# Patient Record
Sex: Male | Born: 1960 | Race: White | Hispanic: No | Marital: Married | Smoking: Never smoker
Health system: Southern US, Community
[De-identification: ages and names within clinical notes are randomized; demographics above are authoritative.]

## PROBLEM LIST (undated history)

## (undated) DIAGNOSIS — M51369 Other intervertebral disc degeneration, lumbar region without mention of lumbar back pain or lower extremity pain: Secondary | ICD-10-CM

## (undated) DIAGNOSIS — G8929 Other chronic pain: Secondary | ICD-10-CM

## (undated) DIAGNOSIS — C220 Liver cell carcinoma: Secondary | ICD-10-CM

## (undated) DIAGNOSIS — G43909 Migraine, unspecified, not intractable, without status migrainosus: Secondary | ICD-10-CM

## (undated) DIAGNOSIS — M5136 Other intervertebral disc degeneration, lumbar region: Secondary | ICD-10-CM

## (undated) DIAGNOSIS — K7290 Hepatic failure, unspecified without coma: Secondary | ICD-10-CM

## (undated) DIAGNOSIS — E119 Type 2 diabetes mellitus without complications: Secondary | ICD-10-CM

## (undated) DIAGNOSIS — D696 Thrombocytopenia, unspecified: Secondary | ICD-10-CM

## (undated) DIAGNOSIS — M5126 Other intervertebral disc displacement, lumbar region: Secondary | ICD-10-CM

## (undated) DIAGNOSIS — K219 Gastro-esophageal reflux disease without esophagitis: Secondary | ICD-10-CM

## (undated) DIAGNOSIS — B1920 Unspecified viral hepatitis C without hepatic coma: Secondary | ICD-10-CM

## (undated) DIAGNOSIS — I10 Essential (primary) hypertension: Secondary | ICD-10-CM

## (undated) DIAGNOSIS — K7682 Hepatic encephalopathy: Secondary | ICD-10-CM

## (undated) HISTORY — DX: Hepatic encephalopathy: K76.82

## (undated) HISTORY — DX: Other intervertebral disc degeneration, lumbar region without mention of lumbar back pain or lower extremity pain: M51.369

## (undated) HISTORY — DX: Hepatic failure, unspecified without coma: K72.90

## (undated) HISTORY — DX: Type 2 diabetes mellitus without complications: E11.9

## (undated) HISTORY — DX: Other intervertebral disc displacement, lumbar region: M51.26

## (undated) HISTORY — DX: Essential (primary) hypertension: I10

## (undated) HISTORY — DX: Unspecified viral hepatitis C without hepatic coma: B19.20

## (undated) HISTORY — DX: Migraine, unspecified, not intractable, without status migrainosus: G43.909

## (undated) HISTORY — DX: Gastro-esophageal reflux disease without esophagitis: K21.9

## (undated) HISTORY — PX: KNEE SURGERY: SHX244

## (undated) HISTORY — DX: Other intervertebral disc degeneration, lumbar region: M51.36

---

## 2008-08-09 ENCOUNTER — Emergency Department: Payer: Self-pay | Admitting: Emergency Medicine

## 2009-01-22 ENCOUNTER — Ambulatory Visit: Payer: Self-pay

## 2009-09-17 ENCOUNTER — Ambulatory Visit: Payer: Self-pay | Admitting: Family Medicine

## 2009-11-05 ENCOUNTER — Ambulatory Visit: Payer: Self-pay | Admitting: Nurse Practitioner

## 2009-11-12 ENCOUNTER — Ambulatory Visit: Payer: Self-pay | Admitting: Nurse Practitioner

## 2009-12-25 ENCOUNTER — Ambulatory Visit: Payer: Self-pay | Admitting: Pain Medicine

## 2009-12-31 ENCOUNTER — Ambulatory Visit: Payer: Self-pay | Admitting: Pain Medicine

## 2011-01-20 ENCOUNTER — Ambulatory Visit: Payer: Self-pay | Admitting: "Endocrinology

## 2011-02-19 LAB — HM DIABETES EYE EXAM

## 2012-08-11 LAB — HM DIABETES EYE EXAM

## 2014-01-11 LAB — HM DIABETES EYE EXAM

## 2014-08-02 ENCOUNTER — Ambulatory Visit: Payer: Self-pay

## 2014-08-02 DIAGNOSIS — I517 Cardiomegaly: Secondary | ICD-10-CM

## 2014-08-27 ENCOUNTER — Encounter: Payer: Self-pay | Admitting: Cardiovascular Disease

## 2014-10-23 ENCOUNTER — Ambulatory Visit: Payer: Self-pay

## 2014-10-30 ENCOUNTER — Ambulatory Visit: Payer: Self-pay

## 2015-01-01 ENCOUNTER — Ambulatory Visit: Payer: Self-pay

## 2015-01-01 DIAGNOSIS — I10 Essential (primary) hypertension: Secondary | ICD-10-CM | POA: Insufficient documentation

## 2015-01-01 DIAGNOSIS — K760 Fatty (change of) liver, not elsewhere classified: Secondary | ICD-10-CM | POA: Insufficient documentation

## 2015-01-01 DIAGNOSIS — K219 Gastro-esophageal reflux disease without esophagitis: Secondary | ICD-10-CM | POA: Insufficient documentation

## 2015-01-01 DIAGNOSIS — E119 Type 2 diabetes mellitus without complications: Secondary | ICD-10-CM | POA: Insufficient documentation

## 2015-01-09 ENCOUNTER — Ambulatory Visit: Payer: Self-pay | Admitting: Internal Medicine

## 2015-01-10 ENCOUNTER — Ambulatory Visit: Payer: Self-pay | Admitting: Ophthalmology

## 2015-01-10 ENCOUNTER — Ambulatory Visit: Payer: Self-pay

## 2015-01-15 ENCOUNTER — Ambulatory Visit: Payer: Self-pay

## 2015-04-11 ENCOUNTER — Ambulatory Visit: Payer: Self-pay

## 2015-04-18 ENCOUNTER — Ambulatory Visit: Payer: Self-pay

## 2015-04-18 DIAGNOSIS — B1920 Unspecified viral hepatitis C without hepatic coma: Secondary | ICD-10-CM | POA: Insufficient documentation

## 2015-04-25 ENCOUNTER — Ambulatory Visit: Payer: Self-pay

## 2015-08-01 ENCOUNTER — Other Ambulatory Visit: Payer: Self-pay

## 2015-08-01 LAB — CBC AND DIFFERENTIAL
HEMATOCRIT: 42 % (ref 41–53)
HEMOGLOBIN: 14.7 g/dL (ref 13.5–17.5)
NEUTROS ABS: 3 /uL
PLATELETS: 82 10*3/uL — AB (ref 150–399)
WBC: 5.9 10^3/mL

## 2015-08-01 LAB — HEPATIC FUNCTION PANEL
ALK PHOS: 63 U/L (ref 25–125)
ALT: 23 U/L (ref 10–40)
AST: 34 U/L (ref 14–40)
BILIRUBIN, TOTAL: 0.7 mg/dL

## 2015-08-01 LAB — BASIC METABOLIC PANEL
BUN: 20 mg/dL (ref 4–21)
Creatinine: 0.9 mg/dL (ref 0.6–1.3)
GLUCOSE: 85 mg/dL
Potassium: 4.7 mmol/L (ref 3.4–5.3)
SODIUM: 140 mmol/L (ref 137–147)

## 2015-08-01 LAB — HEMOGLOBIN A1C: Hemoglobin A1C: 6.9

## 2015-08-13 ENCOUNTER — Ambulatory Visit: Payer: Self-pay

## 2015-08-15 ENCOUNTER — Ambulatory Visit: Payer: Self-pay

## 2015-08-20 ENCOUNTER — Telehealth: Payer: Self-pay

## 2015-08-20 NOTE — Telephone Encounter (Signed)
Pt called to reschd appt for 08/15/15, called and reschd appt for 09/04/15

## 2015-09-04 ENCOUNTER — Ambulatory Visit: Payer: Self-pay | Admitting: Internal Medicine

## 2015-09-06 DIAGNOSIS — B1920 Unspecified viral hepatitis C without hepatic coma: Secondary | ICD-10-CM

## 2015-09-06 DIAGNOSIS — K760 Fatty (change of) liver, not elsewhere classified: Secondary | ICD-10-CM

## 2015-09-06 DIAGNOSIS — Z794 Long term (current) use of insulin: Secondary | ICD-10-CM

## 2015-09-06 DIAGNOSIS — I10 Essential (primary) hypertension: Secondary | ICD-10-CM

## 2015-09-06 DIAGNOSIS — E119 Type 2 diabetes mellitus without complications: Secondary | ICD-10-CM

## 2015-09-06 DIAGNOSIS — K219 Gastro-esophageal reflux disease without esophagitis: Secondary | ICD-10-CM

## 2015-09-11 ENCOUNTER — Ambulatory Visit: Payer: Self-pay | Admitting: Internal Medicine

## 2015-09-11 ENCOUNTER — Encounter: Payer: Self-pay | Admitting: Internal Medicine

## 2015-09-11 VITALS — BP 124/71 | HR 88 | Temp 98.2°F | Wt 256.0 lb

## 2015-09-11 DIAGNOSIS — K7460 Unspecified cirrhosis of liver: Secondary | ICD-10-CM

## 2015-09-11 DIAGNOSIS — I10 Essential (primary) hypertension: Secondary | ICD-10-CM

## 2015-09-11 DIAGNOSIS — B1920 Unspecified viral hepatitis C without hepatic coma: Secondary | ICD-10-CM

## 2015-09-11 DIAGNOSIS — D696 Thrombocytopenia, unspecified: Secondary | ICD-10-CM

## 2015-09-11 DIAGNOSIS — E139 Other specified diabetes mellitus without complications: Secondary | ICD-10-CM

## 2015-09-11 LAB — GLUCOSE, POCT (MANUAL RESULT ENTRY): POC GLUCOSE: 280 mg/dL — AB (ref 70–99)

## 2015-09-11 NOTE — Assessment & Plan Note (Signed)
Has not gotten worse since 2012.

## 2015-09-11 NOTE — Assessment & Plan Note (Addendum)
Advised to lose a little weight. Blood sugars high but last A1c was at target.

## 2015-09-11 NOTE — Assessment & Plan Note (Signed)
Has been resolved through [facility]

## 2015-09-11 NOTE — Progress Notes (Signed)
Subjective:    Patient ID: FITZ ROBBERSON, male    DOB: Jan 16, 1961, 55 y.o.   MRN: [id number]  HPI  Patient Active Problem List   Diagnosis Date Noted  . Thrombocytopenia ([place]) 09/11/2015  . Cirrhosis ([place]) 09/11/2015  . Hepatitis C 04/18/2015  . Diabetes ([place]) 01/01/2015  . GERD (gastroesophageal reflux disease) 01/01/2015  . Hypertension 01/01/2015  . Fatty liver 01/01/2015   Pt presents with a f/u for diabetes. Blood glucose this morning was 280 - pt claims it is because he just ate [place]. Pt checks sugars 3 times daily. Sugars stay pretty high. Last A1c was 6.9, which is much improved.   Pt presents with a f/u for hypertension. Pt had a BP of 124/71 this morning.  Pt has completed treatment regime for Hep C from [facility].   Pt feels well. Pt is still seeing the mental health specialist.   Review of Systems     Objective:   Physical Exam  Constitutional: He is oriented to person, place, and time.  Cardiovascular: Normal rate, regular rhythm and normal heart sounds.   Pulmonary/Chest: Effort normal and breath sounds normal.  Neurological: He is alert and oriented to person, place, and time.    BP 124/71 mmHg  Pulse 88  Temp(Src) 98.2 F (36.8 C)  Wt 256 lb (116.121 kg)   Blood glucose: 280    Medication List       This list is accurate as of: 09/11/15 11:31 AM.  Always use your most recent med list.               ALPRAZolam 1 MG tablet  Commonly known as:  XANAX  Take 1 mg by mouth 3 (three) times daily.     amitriptyline 50 MG tablet  Commonly known as:  ELAVIL  Take 50 mg by mouth at bedtime.     Daclatasvir Dihydrochloride 60 MG Tabs  Take 60 mg by mouth daily.     hydrochlorothiazide 25 MG tablet  Commonly known as:  HYDRODIURIL  Take 25 mg by mouth daily.     Insulin Glargine 100 UNIT/ML Solostar Pen  Commonly known as:  LANTUS  Inject 50 Units into the skin at bedtime.     methadone 10 MG tablet  Commonly known as:   DOLOPHINE  Take 10 mg by mouth daily.     mirtazapine 45 MG tablet  Commonly known as:  REMERON  Take 45 mg by mouth at bedtime.     NOVOLOG FLEXPEN [place]  Inject 35 Units into the skin 3 (three) times daily.     ranitidine 150 MG tablet  Commonly known as:  ZANTAC  Take 150 mg by mouth daily.     spironolactone 50 MG tablet  Commonly known as:  ALDACTONE  Take 50 mg by mouth daily.       Reviewed [facility] notes of 06/04/15. Pt has successfully completed Sovaldi 1 tablet daily and Dalkinza 1 tablet daily for Hep C. Chronic HCV, native pt, genotype 3, with well-compensated cirrhosis.      Assessment & Plan:  Reviewed [facility] notes of 06/04/15. Pt has successfully completed Sovaldi 1 tablet daily and Dalkinza 1 tablet daily for Hep C. Chronic HCV, native pt, genotype 3, with well-compensated cirrhosis.   Cirrhosis ([place]) Diagnosed at [facility]  Thrombocytopenia [facility]) Has not gotten worse since 2012.   Hepatitis C Has been resolved through [facility]  Diabetes [facility]) Advised to  lose a little weight. Blood sugars high but last A1c was at target.   Return in 3 months A1c, Metc, CBC, lipid, tsh,

## 2015-09-11 NOTE — Assessment & Plan Note (Signed)
Diagnosed at [facility]

## 2015-11-20 ENCOUNTER — Other Ambulatory Visit: Payer: Self-pay

## 2015-11-20 DIAGNOSIS — I10 Essential (primary) hypertension: Secondary | ICD-10-CM

## 2015-11-20 DIAGNOSIS — D696 Thrombocytopenia, unspecified: Secondary | ICD-10-CM

## 2015-11-20 DIAGNOSIS — E139 Other specified diabetes mellitus without complications: Secondary | ICD-10-CM

## 2015-11-21 LAB — HEMOGLOBIN A1C
ESTIMATED AVERAGE GLUCOSE: 151 mg/dL
HEMOGLOBIN A1C: 6.9 % — AB (ref 4.8–5.6)

## 2015-11-21 LAB — CBC WITH DIFFERENTIAL/PLATELET
BASOS ABS: 0 10*3/uL (ref 0.0–0.2)
Basos: 0 %
EOS (ABSOLUTE): 0.2 10*3/uL (ref 0.0–0.4)
Eos: 4 %
Hematocrit: 40.4 % (ref 37.5–51.0)
Hemoglobin: 14 g/dL (ref 12.6–17.7)
IMMATURE GRANS (ABS): 0 10*3/uL (ref 0.0–0.1)
Immature Granulocytes: 0 %
LYMPHS: 24 %
Lymphocytes Absolute: 1.2 10*3/uL (ref 0.7–3.1)
MCH: 31 pg (ref 26.6–33.0)
MCHC: 34.7 g/dL (ref 31.5–35.7)
MCV: 89 fL (ref 79–97)
Monocytes Absolute: 0.3 10*3/uL (ref 0.1–0.9)
Monocytes: 6 %
NEUTROS ABS: 3.4 10*3/uL (ref 1.4–7.0)
NEUTROS PCT: 66 %
PLATELETS: 77 10*3/uL — AB (ref 150–379)
RBC: 4.52 x10E6/uL (ref 4.14–5.80)
RDW: 14.3 % (ref 12.3–15.4)
WBC: 5.2 10*3/uL (ref 3.4–10.8)

## 2015-11-21 LAB — COMPREHENSIVE METABOLIC PANEL
A/G RATIO: 1.2 (ref 1.2–2.2)
ALK PHOS: 63 IU/L (ref 39–117)
ALT: 25 IU/L (ref 0–44)
AST: 30 IU/L (ref 0–40)
Albumin: 3.8 g/dL (ref 3.5–5.5)
BILIRUBIN TOTAL: 0.7 mg/dL (ref 0.0–1.2)
BUN/Creatinine Ratio: 23 — ABNORMAL HIGH (ref 9–20)
BUN: 20 mg/dL (ref 6–24)
CHLORIDE: 101 mmol/L (ref 96–106)
CO2: 21 mmol/L (ref 18–29)
Calcium: 9.1 mg/dL (ref 8.7–10.2)
Creatinine, Ser: 0.87 mg/dL (ref 0.76–1.27)
GFR calc non Af Amer: 98 mL/min/{1.73_m2} (ref >59)
GFR, EST AFRICAN AMERICAN: 113 mL/min/{1.73_m2} (ref >59)
Globulin, Total: 3.2 g/dL (ref 1.5–4.5)
Glucose: 164 mg/dL — ABNORMAL HIGH (ref 65–99)
POTASSIUM: 5 mmol/L (ref 3.5–5.2)
Sodium: 136 mmol/L (ref 134–144)
Total Protein: 7 g/dL (ref 6.0–8.5)

## 2015-11-21 LAB — TSH: TSH: 2.22 u[IU]/mL (ref 0.450–4.500)

## 2015-11-21 LAB — LIPID PANEL
Chol/HDL Ratio: 2.7 ratio units (ref 0.0–5.0)
Cholesterol, Total: 117 mg/dL (ref 100–199)
HDL: 43 mg/dL (ref >39)
LDL Calculated: 57 mg/dL (ref 0–99)
Triglycerides: 86 mg/dL (ref 0–149)
VLDL Cholesterol Cal: 17 mg/dL (ref 5–40)

## 2015-12-04 ENCOUNTER — Encounter: Payer: Self-pay | Admitting: Internal Medicine

## 2015-12-04 ENCOUNTER — Ambulatory Visit: Payer: Self-pay | Admitting: Internal Medicine

## 2015-12-04 VITALS — BP 146/91 | HR 106 | Temp 98.7°F | Wt 238.0 lb

## 2015-12-04 DIAGNOSIS — Z794 Long term (current) use of insulin: Secondary | ICD-10-CM

## 2015-12-04 DIAGNOSIS — E119 Type 2 diabetes mellitus without complications: Secondary | ICD-10-CM

## 2015-12-04 LAB — GLUCOSE, POCT (MANUAL RESULT ENTRY): POC Glucose: 167 mg/dL — AB (ref 70–99)

## 2015-12-04 NOTE — Patient Instructions (Signed)
Labs before 3 month appt.

## 2015-12-04 NOTE — Progress Notes (Signed)
   Subjective:    Patient ID: Michael Mack, male    DOB: 02/26/1961, 55 y.o.   MRN: [id number]  HPI   Patient presents with follow up for diabetes. Patient presents with elevated blood pressure and pulse. Patient prescribed methodone from [facility] for pain due to knee surgery. Patient goes to mental health for other psychotropic medications. Blood glucose goes as low as 70.     Patient Active Problem List   Diagnosis Date Noted  . Thrombocytopenia ([place]) 09/11/2015  . Cirrhosis ([place]) 09/11/2015  . Hepatitis C 04/18/2015  . Diabetes ([place]) 01/01/2015  . GERD (gastroesophageal reflux disease) 01/01/2015  . Hypertension 01/01/2015  . Fatty liver 01/01/2015       Review of Systems Patients platelets are low due to cirrhosis.    Objective:   Physical Exam  Constitutional: He is oriented to person, place, and time.  Cardiovascular: Normal rate and regular rhythm.   Pulmonary/Chest: Effort normal.  Neurological: He is alert and oriented to person, place, and time.    BP 146/91 mmHg  Pulse 106  Temp(Src) 98.7 F (37.1 C)  Wt 238 lb (107.956 kg)   Current Outpatient Prescriptions on File Prior to Visit  Medication Sig Dispense Refill  . ALPRAZolam (XANAX) 1 MG tablet Take 1 mg by mouth 3 (three) times daily.    Marland Kitchen amitriptyline (ELAVIL) 50 MG tablet Take 50 mg by mouth at bedtime.    . hydrochlorothiazide (HYDRODIURIL) 25 MG tablet Take 25 mg by mouth daily.    . Insulin Aspart (NOVOLOG FLEXPEN [place]) Inject 35 Units into the skin 3 (three) times daily.    . Insulin Glargine (LANTUS) 100 UNIT/ML Solostar Pen Inject 50 Units into the skin at bedtime.    . methadone (DOLOPHINE) 10 MG tablet Take 10 mg by mouth daily.    . mirtazapine (REMERON) 45 MG tablet Take 45 mg by mouth at bedtime.    . ranitidine (ZANTAC) 150 MG tablet Take 150 mg by mouth daily.    [place] Dihydrochloride 60 MG TABS Take 60 mg by mouth daily.    Marland Kitchen spironolactone (ALDACTONE) 50 MG tablet  Take 50 mg by mouth daily.     No current facility-administered medications on file prior to visit.         Assessment & Plan:    Patient needs follow up in 3 months with labs: Met C, CBC, GGT, A1c, UA, and microalbumin Patient needs follow up appt in 3 months after labs

## 2015-12-11 ENCOUNTER — Other Ambulatory Visit: Payer: Self-pay | Admitting: Nurse Practitioner

## 2016-02-27 ENCOUNTER — Telehealth: Payer: Self-pay | Admitting: Nurse Practitioner

## 2016-02-27 NOTE — Telephone Encounter (Signed)
Patient called to change appointment on September 20 at 12:15.

## 2016-03-11 ENCOUNTER — Other Ambulatory Visit: Payer: Self-pay

## 2016-03-11 DIAGNOSIS — E119 Type 2 diabetes mellitus without complications: Secondary | ICD-10-CM

## 2016-03-11 DIAGNOSIS — Z794 Long term (current) use of insulin: Secondary | ICD-10-CM

## 2016-03-12 LAB — COMPREHENSIVE METABOLIC PANEL
ALK PHOS: 62 IU/L (ref 39–117)
ALT: 23 IU/L (ref 0–44)
AST: 20 IU/L (ref 0–40)
Albumin/Globulin Ratio: 1.2 (ref 1.2–2.2)
Albumin: 4.2 g/dL (ref 3.5–5.5)
BUN/Creatinine Ratio: 26 — ABNORMAL HIGH (ref 9–20)
BUN: 22 mg/dL (ref 6–24)
Bilirubin Total: 0.6 mg/dL (ref 0.0–1.2)
CO2: 22 mmol/L (ref 18–29)
CREATININE: 0.85 mg/dL (ref 0.76–1.27)
Calcium: 9 mg/dL (ref 8.7–10.2)
Chloride: 100 mmol/L (ref 96–106)
GFR calc Af Amer: 114 mL/min/{1.73_m2} (ref >59)
GFR calc non Af Amer: 99 mL/min/{1.73_m2} (ref >59)
GLOBULIN, TOTAL: 3.4 g/dL (ref 1.5–4.5)
GLUCOSE: 180 mg/dL — AB (ref 65–99)
Potassium: 4.3 mmol/L (ref 3.5–5.2)
SODIUM: 138 mmol/L (ref 134–144)
Total Protein: 7.6 g/dL (ref 6.0–8.5)

## 2016-03-12 LAB — URINALYSIS
Bilirubin, UA: NEGATIVE
GLUCOSE, UA: NEGATIVE
KETONES UA: NEGATIVE
NITRITE UA: NEGATIVE
Protein, UA: NEGATIVE
SPEC GRAV UA: 1.015 (ref 1.005–1.030)
UUROB: 0.2 mg/dL (ref 0.2–1.0)
pH, UA: 6 (ref 5.0–7.5)

## 2016-03-12 LAB — HEMOGLOBIN A1C
ESTIMATED AVERAGE GLUCOSE: 146 mg/dL
Hgb A1c MFr Bld: 6.7 % — ABNORMAL HIGH (ref 4.8–5.6)

## 2016-03-12 LAB — CBC WITH DIFFERENTIAL/PLATELET
BASOS ABS: 0 10*3/uL (ref 0.0–0.2)
Basos: 0 %
EOS (ABSOLUTE): 0.1 10*3/uL (ref 0.0–0.4)
EOS: 2 %
HEMOGLOBIN: 14.7 g/dL (ref 12.6–17.7)
Hematocrit: 41.1 % (ref 37.5–51.0)
IMMATURE GRANULOCYTES: 0 %
Immature Grans (Abs): 0 10*3/uL (ref 0.0–0.1)
Lymphocytes Absolute: 1 10*3/uL (ref 0.7–3.1)
Lymphs: 17 %
MCH: 32 pg (ref 26.6–33.0)
MCHC: 35.8 g/dL — AB (ref 31.5–35.7)
MCV: 89 fL (ref 79–97)
MONOS ABS: 0.3 10*3/uL (ref 0.1–0.9)
Monocytes: 6 %
NEUTROS PCT: 75 %
Neutrophils Absolute: 4.2 10*3/uL (ref 1.4–7.0)
PLATELETS: 77 10*3/uL — AB (ref 150–379)
RBC: 4.6 x10E6/uL (ref 4.14–5.80)
RDW: 14.6 % (ref 12.3–15.4)
WBC: 5.7 10*3/uL (ref 3.4–10.8)

## 2016-03-12 LAB — MICROALBUMIN / CREATININE URINE RATIO
Creatinine, Urine: 73.4 mg/dL
MICROALB/CREAT RATIO: 71.5 mg/g{creat} — AB (ref 0.0–30.0)
MICROALBUM., U, RANDOM: 52.5 ug/mL

## 2016-03-12 LAB — GAMMA GT: GGT: 63 IU/L (ref 0–65)

## 2016-03-18 ENCOUNTER — Encounter: Payer: Self-pay | Admitting: Internal Medicine

## 2016-03-18 ENCOUNTER — Ambulatory Visit: Payer: Self-pay | Admitting: Internal Medicine

## 2016-03-18 VITALS — BP 132/70 | HR 90 | Wt 264.0 lb

## 2016-03-18 DIAGNOSIS — Z794 Long term (current) use of insulin: Secondary | ICD-10-CM

## 2016-03-18 DIAGNOSIS — E119 Type 2 diabetes mellitus without complications: Secondary | ICD-10-CM

## 2016-03-18 LAB — GLUCOSE, POCT (MANUAL RESULT ENTRY): POC Glucose: 116 mg/dl — AB (ref 70–99)

## 2016-03-18 NOTE — Progress Notes (Signed)
   Subjective:    Patient ID: Michael Mack, male    DOB: 05-20-1961, 55 y.o.   MRN: [id number]  HPI   Pt f/u for diabetes, thrombocytopenia, and cirrhosis BP is stable at 132/70 Pt is having MRI of liver   Patient Active Problem List   Diagnosis Date Noted  . Thrombocytopenia ([place]) 09/11/2015  . Cirrhosis ([place]) 09/11/2015  . Hepatitis C 04/18/2015  . Diabetes ([place]) 01/01/2015  . GERD (gastroesophageal reflux disease) 01/01/2015  . Hypertension 01/01/2015  . Fatty liver 01/01/2015     Medication List       Accurate as of 03/18/16  9:38 AM. Always use your most recent med list.          ALPRAZolam 1 MG tablet Commonly known as:  XANAX Take 1 mg by mouth 3 (three) times daily.   amitriptyline 50 MG tablet Commonly known as:  ELAVIL Take 50 mg by mouth at bedtime.   Daclatasvir Dihydrochloride 60 MG Tabs Take 60 mg by mouth daily.   hydrochlorothiazide 25 MG tablet Commonly known as:  HYDRODIURIL Take 25 mg by mouth daily.   Insulin Glargine 100 UNIT/ML Solostar Pen Commonly known as:  LANTUS Inject 50 Units into the skin at bedtime.   lisinopril 40 MG tablet Commonly known as:  PRINIVIL,ZESTRIL TAKE ONE TABLET BY MOUTH EVERY DAY.   methadone 10 MG tablet Commonly known as:  DOLOPHINE Take 10 mg by mouth daily.   mirtazapine 45 MG tablet Commonly known as:  REMERON Take 45 mg by mouth at bedtime.   NOVOLOG FLEXPEN [place] Inject 35 Units into the skin 3 (three) times daily.   ranitidine 150 MG tablet Commonly known as:  ZANTAC Take 150 mg by mouth daily.   spironolactone 50 MG tablet Commonly known as:  ALDACTONE Take 50 mg by mouth daily.        Review of Systems     Objective:   Physical Exam  Constitutional: He is oriented to person, place, and time.  Cardiovascular: Normal rate, regular rhythm and normal heart sounds.   Pulmonary/Chest: Effort normal and breath sounds normal.  Neurological: He is alert and oriented to person, place, and  time.    BP 132/70   Pulse 90   Wt 264 lb (119.7 kg)        Assessment & Plan:   F/u in 3 months w/ labs: CBC, Lipid, Met C, PSA

## 2016-03-18 NOTE — Patient Instructions (Signed)
F/u in 3 months w/ labs: CBC, Lipid, Met C, PSA

## 2016-04-01 ENCOUNTER — Ambulatory Visit: Payer: Self-pay | Admitting: Internal Medicine

## 2016-04-01 ENCOUNTER — Encounter: Payer: Self-pay | Admitting: Internal Medicine

## 2016-04-01 VITALS — BP 147/81 | HR 86 | Temp 98.2°F | Wt 258.0 lb

## 2016-04-01 DIAGNOSIS — E119 Type 2 diabetes mellitus without complications: Secondary | ICD-10-CM

## 2016-04-01 DIAGNOSIS — Z794 Long term (current) use of insulin: Secondary | ICD-10-CM

## 2016-04-01 LAB — GLUCOSE, POCT (MANUAL RESULT ENTRY): POC GLUCOSE: 132 mg/dL — AB (ref 70–99)

## 2016-04-01 MED ORDER — HYDROCHLOROTHIAZIDE 25 MG PO TABS: 25.0000 mg | ORAL_TABLET | Freq: Every day | ORAL | 2 refills | Status: DC

## 2016-04-01 MED ORDER — RANITIDINE HCL 150 MG PO TABS: 150.0000 mg | ORAL_TABLET | Freq: Every day | ORAL | 2 refills | Status: DC

## 2016-04-01 MED ORDER — LISINOPRIL 40 MG PO TABS: 40.0000 mg | ORAL_TABLET | Freq: Every day | ORAL | 2 refills | Status: DC

## 2016-04-01 NOTE — Patient Instructions (Signed)
F/u in 2 months w/ labs: Met C, CBC, Ua, A1C, Ammonia levelas

## 2016-04-01 NOTE — Progress Notes (Signed)
   Subjective:    Patient ID: Michael Mack, male    DOB: [birth year], 55 y.o.   MRN: [id number]  HPI   Pt presents w/ fatigue  Patient Active Problem List   Diagnosis Date Noted  . Thrombocytopenia ([place]) 09/11/2015  . Cirrhosis ([place]) 09/11/2015  . Hepatitis C 04/18/2015  . Diabetes ([place]) 01/01/2015  . GERD (gastroesophageal reflux disease) 01/01/2015  . Hypertension 01/01/2015  . Fatty liver 01/01/2015     Medication List       Accurate as of 04/01/16 10:01 AM. Always use your most recent med list.          ALPRAZolam 1 MG tablet Commonly known as:  XANAX Take 1 mg by mouth 3 (three) times daily.   amitriptyline 50 MG tablet Commonly known as:  ELAVIL Take 50 mg by mouth at bedtime.   Daclatasvir Dihydrochloride 60 MG Tabs Take 60 mg by mouth daily.   hydrochlorothiazide 25 MG tablet Commonly known as:  HYDRODIURIL Take 25 mg by mouth daily.   Insulin Glargine 100 UNIT/ML Solostar Pen Commonly known as:  LANTUS Inject 50 Units into the skin at bedtime.   lisinopril 40 MG tablet Commonly known as:  PRINIVIL,ZESTRIL TAKE ONE TABLET BY MOUTH EVERY DAY.   methadone 10 MG tablet Commonly known as:  DOLOPHINE Take 10 mg by mouth daily.   mirtazapine 45 MG tablet Commonly known as:  REMERON Take 45 mg by mouth at bedtime.   NOVOLOG FLEXPEN [place] Inject 35 Units into the skin 3 (three) times daily.   ranitidine 150 MG tablet Commonly known as:  ZANTAC Take 150 mg by mouth daily.   spironolactone 50 MG tablet Commonly known as:  ALDACTONE Take 50 mg by mouth daily.        Review of Systems     Objective:   Physical Exam  Constitutional: He is oriented to person, place, and time.  Cardiovascular: Normal rate, regular rhythm and normal heart sounds.   Pulmonary/Chest: Effort normal and breath sounds normal.  Neurological: He is alert and oriented to person, place, and time.     BP (!) 147/81   Pulse 86   Temp 98.2 F (36.8 C) (Oral)   Wt 258  lb (117 kg)       Assessment & Plan:   F/u in 2 months w/ labs: Met C, CBC, Ua, A1C, Ammonia levels

## 2016-05-27 ENCOUNTER — Other Ambulatory Visit: Payer: Self-pay

## 2016-05-27 ENCOUNTER — Ambulatory Visit: Payer: Self-pay | Admitting: Internal Medicine

## 2016-05-27 DIAGNOSIS — Z794 Long term (current) use of insulin: Secondary | ICD-10-CM

## 2016-05-27 DIAGNOSIS — E119 Type 2 diabetes mellitus without complications: Secondary | ICD-10-CM

## 2016-05-28 LAB — URINALYSIS
BILIRUBIN UA: NEGATIVE
Glucose, UA: NEGATIVE
Ketones, UA: NEGATIVE
NITRITE UA: NEGATIVE
PH UA: 6 (ref 5.0–7.5)
Protein, UA: NEGATIVE
RBC UA: NEGATIVE
SPEC GRAV UA: 1.009 (ref 1.005–1.030)
UUROB: 0.2 mg/dL (ref 0.2–1.0)

## 2016-05-28 LAB — CBC
Hematocrit: 40.3 % (ref 37.5–51.0)
Hemoglobin: 13.9 g/dL (ref 13.0–17.7)
MCH: 31 pg (ref 26.6–33.0)
MCHC: 34.5 g/dL (ref 31.5–35.7)
MCV: 90 fL (ref 79–97)
PLATELETS: 79 10*3/uL — AB (ref 150–379)
RBC: 4.48 x10E6/uL (ref 4.14–5.80)
RDW: 14.4 % (ref 12.3–15.4)
WBC: 5.6 10*3/uL (ref 3.4–10.8)

## 2016-05-28 LAB — COMPREHENSIVE METABOLIC PANEL
A/G RATIO: 1.1 — AB (ref 1.2–2.2)
ALBUMIN: 3.9 g/dL (ref 3.5–5.5)
ALT: 21 IU/L (ref 0–44)
AST: 23 IU/L (ref 0–40)
Alkaline Phosphatase: 58 IU/L (ref 39–117)
BILIRUBIN TOTAL: 0.6 mg/dL (ref 0.0–1.2)
BUN / CREAT RATIO: 19 (ref 9–20)
BUN: 17 mg/dL (ref 6–24)
CHLORIDE: 101 mmol/L (ref 96–106)
CO2: 28 mmol/L (ref 18–29)
Calcium: 9.2 mg/dL (ref 8.7–10.2)
Creatinine, Ser: 0.88 mg/dL (ref 0.76–1.27)
GFR calc non Af Amer: 97 mL/min/{1.73_m2} (ref >59)
GFR, EST AFRICAN AMERICAN: 112 mL/min/{1.73_m2} (ref >59)
Globulin, Total: 3.4 g/dL (ref 1.5–4.5)
Glucose: 109 mg/dL — ABNORMAL HIGH (ref 65–99)
POTASSIUM: 4.5 mmol/L (ref 3.5–5.2)
SODIUM: 141 mmol/L (ref 134–144)
TOTAL PROTEIN: 7.3 g/dL (ref 6.0–8.5)

## 2016-05-28 LAB — LIPID PANEL
Chol/HDL Ratio: 2.6 ratio units (ref 0.0–5.0)
Cholesterol, Total: 153 mg/dL (ref 100–199)
HDL: 60 mg/dL (ref >39)
LDL Calculated: 78 mg/dL (ref 0–99)
Triglycerides: 75 mg/dL (ref 0–149)
VLDL Cholesterol Cal: 15 mg/dL (ref 5–40)

## 2016-05-28 LAB — HEMOGLOBIN A1C
Est. average glucose Bld gHb Est-mCnc: 171 mg/dL
HEMOGLOBIN A1C: 7.6 % — AB (ref 4.8–5.6)

## 2016-05-28 LAB — PSA

## 2016-05-28 LAB — AMMONIA

## 2016-06-17 ENCOUNTER — Other Ambulatory Visit: Payer: Self-pay

## 2016-06-17 ENCOUNTER — Ambulatory Visit: Payer: Self-pay | Admitting: Internal Medicine

## 2016-06-17 ENCOUNTER — Encounter: Payer: Self-pay | Admitting: Internal Medicine

## 2016-06-17 VITALS — BP 132/72 | HR 97 | Temp 98.7°F | Wt 268.0 lb

## 2016-06-17 DIAGNOSIS — Z794 Long term (current) use of insulin: Secondary | ICD-10-CM

## 2016-06-17 DIAGNOSIS — E119 Type 2 diabetes mellitus without complications: Secondary | ICD-10-CM

## 2016-06-17 DIAGNOSIS — R0602 Shortness of breath: Secondary | ICD-10-CM

## 2016-06-17 LAB — GLUCOSE, POCT (MANUAL RESULT ENTRY): POC Glucose: 82 mg/dl (ref 70–99)

## 2016-06-17 MED ORDER — INSULIN ASPART 100 UNIT/ML ~~LOC~~ SOLN: 35.0000 [IU] | Freq: Three times a day (TID) | SUBCUTANEOUS | 11 refills | Status: DC

## 2016-06-17 MED ORDER — INSULIN GLARGINE 100 UNIT/ML SOLOSTAR PEN: 50.0000 [IU] | PEN_INJECTOR | Freq: Every day | SUBCUTANEOUS | 11 refills | Status: DC

## 2016-06-17 NOTE — Patient Instructions (Signed)
RTC in 3 months labs the week before cbc, met c, and a1c. Xray for SOB 

## 2016-06-17 NOTE — Progress Notes (Signed)
   Subjective:    Patient ID: Michael Mack, male    DOB: [birth year], 55 y.o.   MRN: [id number]  HPI BP 132/72   Pulse 97   Temp 98.7 F (37.1 C) (Oral)   Wt 268 lb (121.6 kg)   Allergies as of 06/17/2016   No Known Allergies     Medication List       Accurate as of 06/17/16  9:21 AM. Always use your most recent med list.          ALPRAZolam 1 MG tablet Commonly known as:  XANAX Take 1 mg by mouth 3 (three) times daily.   amitriptyline 50 MG tablet Commonly known as:  ELAVIL Take 50 mg by mouth at bedtime.   Daclatasvir Dihydrochloride 60 MG Tabs Take 60 mg by mouth daily.   hydrochlorothiazide 25 MG tablet Commonly known as:  HYDRODIURIL Take 1 tablet (25 mg total) by mouth daily.   Insulin Glargine 100 UNIT/ML Solostar Pen Commonly known as:  LANTUS Inject 50 Units into the skin at bedtime.   lisinopril 40 MG tablet Commonly known as:  PRINIVIL,ZESTRIL Take 1 tablet (40 mg total) by mouth daily.   methadone 10 MG tablet Commonly known as:  DOLOPHINE Take 10 mg by mouth daily.   mirtazapine 45 MG tablet Commonly known as:  REMERON Take 45 mg by mouth at bedtime.   NOVOLOG FLEXPEN [place] Inject 35 Units into the skin 3 (three) times daily.   ranitidine 150 MG tablet Commonly known as:  ZANTAC Take 1 tablet (150 mg total) by mouth daily.   spironolactone 50 MG tablet Commonly known as:  ALDACTONE Take 50 mg by mouth daily.      Pt presents for follow up on diabetes. Pt c/o of coughing and SOB. Pt's feet and legs are swelling.    Review of Systems     Objective:   Physical Exam  Constitutional: He is oriented to person, place, and time.  Cardiovascular: Normal rate, regular rhythm and normal heart sounds.   Pulmonary/Chest: Effort normal and breath sounds normal.  Neurological: He is alert and oriented to person, place, and time. He has normal reflexes.          Assessment & Plan:  Sugars look good today. A1c is slightly elevated. Needs  to watch diet. Lungs are clear and minimum edema below lower extremities. Cardiac rhythm is normal. refills for insulin placed today. Labs 3 months cbc, met c, and a1c. OV for 3 months. Xray for SOB.

## 2016-06-30 ENCOUNTER — Ambulatory Visit: Payer: Self-pay

## 2016-07-07 ENCOUNTER — Telehealth: Payer: Self-pay

## 2016-07-07 NOTE — Telephone Encounter (Signed)
Called pt to reschedule due to weather on 07/08/16. Left msg for pt to call back.

## 2016-07-08 ENCOUNTER — Ambulatory Visit: Payer: Self-pay | Admitting: Internal Medicine

## 2016-07-14 ENCOUNTER — Other Ambulatory Visit: Payer: Self-pay

## 2016-07-14 ENCOUNTER — Ambulatory Visit: Payer: Self-pay | Admitting: Adult Health Nurse Practitioner

## 2016-07-14 VITALS — BP 155/86 | Temp 98.7°F | Wt 269.0 lb

## 2016-07-14 DIAGNOSIS — Z794 Long term (current) use of insulin: Secondary | ICD-10-CM

## 2016-07-14 DIAGNOSIS — E119 Type 2 diabetes mellitus without complications: Secondary | ICD-10-CM

## 2016-07-14 DIAGNOSIS — I10 Essential (primary) hypertension: Secondary | ICD-10-CM

## 2016-07-14 LAB — GLUCOSE, POCT (MANUAL RESULT ENTRY): POC Glucose: 266 mg/dL — AB (ref 70–99)

## 2016-07-14 NOTE — Telephone Encounter (Signed)
Received PAP application from [facility] for Novolog placed for provider to sign. 

## 2016-07-14 NOTE — Telephone Encounter (Signed)
Received PAP application from [facility] for Lantus placed for provider to sign. 

## 2016-07-14 NOTE — Progress Notes (Signed)
  Patient: Michael Mack Male    DOB: [birth year]   56 y.o.   MRN: [id number] Visit Date: 07/14/2016  Today's Provider: [place]   Chief Complaint  Patient presents with  . Atrial Fibrillation    EKG needed per request of Dr. at [facility]    Subjective:    HPI  Pt being seen at [facility] by Dr. Magdalene Molly and requests an EKG due to Methadone use.   BP elevated.  Pt states it is usually not that high because he has not taken his medications today.  Last BP 132/72.      No Known Allergies Previous Medications   ALPRAZOLAM (XANAX) 1 MG TABLET    Take 1 mg by mouth 3 (three) times daily.   AMITRIPTYLINE (ELAVIL) 50 MG TABLET    Take 50 mg by mouth at bedtime.   DACLATASVIR DIHYDROCHLORIDE 60 MG TABS    Take 60 mg by mouth daily.   HYDROCHLOROTHIAZIDE (HYDRODIURIL) 25 MG TABLET    Take 1 tablet (25 mg total) by mouth daily.   INSULIN ASPART (NOVOLOG FLEXPEN [place])    Inject 35 Units into the skin 3 (three) times daily.   INSULIN ASPART (NOVOLOG) 100 UNIT/ML INJECTION    Inject 35 Units into the skin 3 (three) times daily with meals.   INSULIN GLARGINE (LANTUS) 100 UNIT/ML SOLOSTAR PEN    Inject 50 Units into the skin at bedtime.   LISINOPRIL (PRINIVIL,ZESTRIL) 40 MG TABLET    Take 1 tablet (40 mg total) by mouth daily.   METHADONE (DOLOPHINE) 10 MG TABLET    Take 10 mg by mouth daily.   MIRTAZAPINE (REMERON) 45 MG TABLET    Take 45 mg by mouth at bedtime.   RANITIDINE (ZANTAC) 150 MG TABLET    Take 1 tablet (150 mg total) by mouth daily.   SPIRONOLACTONE (ALDACTONE) 50 MG TABLET    Take 50 mg by mouth daily.    Review of Systems  All other systems reviewed and are negative.   Social History  Substance Use Topics  . Smoking status: Never Smoker  . Smokeless tobacco: Not on file  . Alcohol use No   Objective:   BP (!) 155/86   Temp 98.7 F (37.1 C)   Wt 269 lb (122 kg)   Physical Exam  Constitutional: He is oriented to person, place, and time. He appears  well-developed and well-nourished.  HENT:  Head: Normocephalic and atraumatic.  Cardiovascular: Normal rate, regular rhythm and normal heart sounds.   Pulmonary/Chest: Effort normal and breath sounds normal.  Neurological: He is alert and oriented to person, place, and time.  Vitals reviewed.       Assessment & Plan:     Will refer pt for EKG.  Take BP medications as directed.  Will re-evaluate BP at next OV.  FU as previously scheduled.        [facility]

## 2016-07-17 NOTE — Telephone Encounter (Signed)
Placed signed application/script in [facility] folder for pickup. 

## 2016-07-20 ENCOUNTER — Telehealth: Payer: Self-pay | Admitting: Pharmacist

## 2016-07-20 NOTE — Telephone Encounter (Signed)
Novo Nordisk PAP refill submitted to manufacturer today. Novolog insulin, 35 units, three times a day with meals.

## 2016-07-20 NOTE — Telephone Encounter (Signed)
[employer] PAP refill submitted to manufacturer today. Lantus Solostar pen insulin, 50 units, daily at bedtime.

## 2016-07-23 ENCOUNTER — Ambulatory Visit
Admission: RE | Admit: 2016-07-23 | Discharge: 2016-07-23 | Disposition: A | Discharge status: Home / Self Care | Payer: Self-pay | Source: Ambulatory Visit | Attending: Internal Medicine | Admitting: Internal Medicine

## 2016-07-23 DIAGNOSIS — R0602 Shortness of breath: Secondary | ICD-10-CM

## 2016-07-30 ENCOUNTER — Ambulatory Visit
Admission: RE | Admit: 2016-07-30 | Discharge: 2016-07-30 | Disposition: A | Discharge status: Home / Self Care | Payer: Self-pay | Source: Ambulatory Visit | Attending: Adult Health Nurse Practitioner | Admitting: Adult Health Nurse Practitioner

## 2016-07-30 DIAGNOSIS — Z029 Encounter for administrative examinations, unspecified: Secondary | ICD-10-CM | POA: Insufficient documentation

## 2016-08-07 ENCOUNTER — Telehealth: Payer: Self-pay

## 2016-08-07 NOTE — Telephone Encounter (Signed)
Called pt to discuss results. PT verbalized understanding.

## 2016-09-16 ENCOUNTER — Other Ambulatory Visit: Payer: Self-pay

## 2016-09-16 DIAGNOSIS — Z794 Long term (current) use of insulin: Secondary | ICD-10-CM

## 2016-09-16 DIAGNOSIS — E119 Type 2 diabetes mellitus without complications: Secondary | ICD-10-CM

## 2016-09-17 LAB — COMPREHENSIVE METABOLIC PANEL
A/G RATIO: 1.1 — AB (ref 1.2–2.2)
ALT: 22 IU/L (ref 0–44)
AST: 28 IU/L (ref 0–40)
Albumin: 4.1 g/dL (ref 3.5–5.5)
Alkaline Phosphatase: 70 IU/L (ref 39–117)
BUN/Creatinine Ratio: 18 (ref 9–20)
BUN: 22 mg/dL (ref 6–24)
Bilirubin Total: 1 mg/dL (ref 0.0–1.2)
CALCIUM: 9.4 mg/dL (ref 8.7–10.2)
CO2: 20 mmol/L (ref 18–29)
Chloride: 100 mmol/L (ref 96–106)
Creatinine, Ser: 1.23 mg/dL (ref 0.76–1.27)
GFR calc Af Amer: 76 mL/min/{1.73_m2} (ref >59)
GFR, EST NON AFRICAN AMERICAN: 66 mL/min/{1.73_m2} (ref >59)
GLUCOSE: 250 mg/dL — AB (ref 65–99)
Globulin, Total: 3.8 g/dL (ref 1.5–4.5)
Potassium: 4 mmol/L (ref 3.5–5.2)
Sodium: 139 mmol/L (ref 134–144)
Total Protein: 7.9 g/dL (ref 6.0–8.5)

## 2016-09-17 LAB — CBC WITH DIFFERENTIAL
BASOS ABS: 0 10*3/uL (ref 0.0–0.2)
Basos: 0 %
EOS (ABSOLUTE): 0.3 10*3/uL (ref 0.0–0.4)
Eos: 4 %
Hematocrit: 46.8 % (ref 37.5–51.0)
Hemoglobin: 16.1 g/dL (ref 13.0–17.7)
IMMATURE GRANULOCYTES: 0 %
Immature Grans (Abs): 0 10*3/uL (ref 0.0–0.1)
LYMPHS: 28 %
Lymphocytes Absolute: 2.4 10*3/uL (ref 0.7–3.1)
MCH: 30.8 pg (ref 26.6–33.0)
MCHC: 34.4 g/dL (ref 31.5–35.7)
MCV: 90 fL (ref 79–97)
MONOS ABS: 0.6 10*3/uL (ref 0.1–0.9)
Monocytes: 7 %
NEUTROS PCT: 61 %
Neutrophils Absolute: 5.2 10*3/uL (ref 1.4–7.0)
RBC: 5.23 x10E6/uL (ref 4.14–5.80)
RDW: 15.1 % (ref 12.3–15.4)
WBC: 8.5 10*3/uL (ref 3.4–10.8)

## 2016-09-17 LAB — HEMOGLOBIN A1C
ESTIMATED AVERAGE GLUCOSE: 177 mg/dL
Hgb A1c MFr Bld: 7.8 % — ABNORMAL HIGH (ref 4.8–5.6)

## 2016-09-17 NOTE — Progress Notes (Signed)
Pt continues to have low platets, will ned a cbc without platelets but without diff, in approx 1 month, thanks

## 2016-09-23 ENCOUNTER — Ambulatory Visit: Payer: Self-pay | Admitting: Internal Medicine

## 2016-09-30 ENCOUNTER — Ambulatory Visit: Payer: Self-pay | Admitting: Internal Medicine

## 2016-10-05 ENCOUNTER — Telehealth: Payer: Self-pay

## 2016-10-05 NOTE — Telephone Encounter (Signed)
Received PAP application from [facility] for Lantus placed for provider to sign. 

## 2016-10-21 ENCOUNTER — Other Ambulatory Visit: Payer: Self-pay

## 2016-10-21 DIAGNOSIS — E119 Type 2 diabetes mellitus without complications: Secondary | ICD-10-CM

## 2016-10-21 DIAGNOSIS — Z794 Long term (current) use of insulin: Secondary | ICD-10-CM

## 2016-10-22 LAB — CBC
HEMATOCRIT: 41.4 % (ref 37.5–51.0)
Hemoglobin: 14 g/dL (ref 13.0–17.7)
MCH: 30.9 pg (ref 26.6–33.0)
MCHC: 33.8 g/dL (ref 31.5–35.7)
MCV: 91 fL (ref 79–97)
PLATELETS: 73 10*3/uL — AB (ref 150–379)
RBC: 4.53 x10E6/uL (ref 4.14–5.80)
RDW: 14.7 % (ref 12.3–15.4)
WBC: 4.3 10*3/uL (ref 3.4–10.8)

## 2016-10-28 ENCOUNTER — Telehealth: Payer: Self-pay

## 2016-10-28 NOTE — Telephone Encounter (Signed)
Called pt with results. PT verbalized understanding. Appt made. Will like Dr. Mable Fill to discuss with him why his platelets are low at next OV.

## 2016-10-28 NOTE — Telephone Encounter (Signed)
-----   Message from Nori Riis, PA-C sent at 10/22/2016  9:57 PM EDT ----- Patient's platelets have been ~ 70 over the last year.  Continue to monitor CBC.  Repeat in one month.

## 2016-11-04 ENCOUNTER — Ambulatory Visit: Payer: Self-pay | Admitting: Internal Medicine

## 2016-11-04 ENCOUNTER — Encounter: Payer: Self-pay | Admitting: Internal Medicine

## 2016-11-04 DIAGNOSIS — E119 Type 2 diabetes mellitus without complications: Secondary | ICD-10-CM

## 2016-11-04 DIAGNOSIS — Z794 Long term (current) use of insulin: Secondary | ICD-10-CM

## 2016-11-04 MED ORDER — RANITIDINE HCL 150 MG PO TABS: 150.0000 mg | ORAL_TABLET | Freq: Every day | ORAL | 3 refills | Status: DC

## 2016-11-04 MED ORDER — INSULIN GLARGINE 100 UNIT/ML SOLOSTAR PEN: 50.0000 [IU] | PEN_INJECTOR | Freq: Every day | SUBCUTANEOUS | 11 refills | Status: DC

## 2016-11-04 MED ORDER — LISINOPRIL 40 MG PO TABS: 40.0000 mg | ORAL_TABLET | Freq: Every day | ORAL | 3 refills | Status: DC

## 2016-11-04 MED ORDER — HYDROCHLOROTHIAZIDE 25 MG PO TABS: 25.0000 mg | ORAL_TABLET | Freq: Every day | ORAL | 3 refills | Status: DC

## 2016-11-04 MED ORDER — INSULIN ASPART 100 UNIT/ML ~~LOC~~ SOLN: 35.0000 [IU] | Freq: Three times a day (TID) | SUBCUTANEOUS | 11 refills | Status: DC

## 2016-11-04 NOTE — Patient Instructions (Signed)
F/u in 3 mo w/ labs

## 2016-11-04 NOTE — Progress Notes (Signed)
   Subjective:    Patient ID: Michael Mack, male    DOB: Mar 27, 1961, 56 y.o.   MRN: [id number]  HPI  Pt here for 3 mo f/u for diabetes. Pt reports morning sugar levels are sometimes up to 130. Reports occasionally checks sugar before dinner which can run up to 145.   Pt complaint of R lower leg pain. It was bruised 2 weeks ago. Pain is improving.  Patient Active Problem List   Diagnosis Date Noted  . Thrombocytopenia ([place]) 09/11/2015  . Cirrhosis ([place]) 09/11/2015  . Hepatitis C 04/18/2015  . Diabetes ([place]) 01/01/2015  . GERD (gastroesophageal reflux disease) 01/01/2015  . Hypertension 01/01/2015  . Fatty liver 01/01/2015   Allergies as of 11/04/2016   No Known Allergies     Medication List       Accurate as of 11/04/16 10:30 AM. Always use your most recent med list.          ALPRAZolam 1 MG tablet Commonly known as:  XANAX Take 1 mg by mouth 3 (three) times daily.   amitriptyline 50 MG tablet Commonly known as:  ELAVIL Take 50 mg by mouth at bedtime.   Daclatasvir Dihydrochloride 60 MG Tabs Take 60 mg by mouth daily.   hydrochlorothiazide 25 MG tablet Commonly known as:  HYDRODIURIL Take 1 tablet (25 mg total) by mouth daily.   Insulin Glargine 100 UNIT/ML Solostar Pen Commonly known as:  LANTUS Inject 50 Units into the skin at bedtime.   lisinopril 40 MG tablet Commonly known as:  PRINIVIL,ZESTRIL Take 1 tablet (40 mg total) by mouth daily.   methadone 10 MG tablet Commonly known as:  DOLOPHINE Take 10 mg by mouth daily.   mirtazapine 45 MG tablet Commonly known as:  REMERON Take 45 mg by mouth at bedtime.   NOVOLOG FLEXPEN [place] Inject 35 Units into the skin 3 (three) times daily.   insulin aspart 100 UNIT/ML injection Commonly known as:  NOVOLOG Inject 35 Units into the skin 3 (three) times daily with meals.   ranitidine 150 MG tablet Commonly known as:  ZANTAC Take 1 tablet (150 mg total) by mouth daily.   spironolactone 50 MG tablet Commonly  known as:  ALDACTONE Take 50 mg by mouth daily.        Review of Systems     Objective:   Physical Exam  Constitutional: He is oriented to person, place, and time.  Cardiovascular: Normal rate, regular rhythm and normal heart sounds.   Pulmonary/Chest: Effort normal and breath sounds normal.  Neurological: He is alert and oriented to person, place, and time.    BP (!) 145/75 (BP Location: Left Arm, Patient Position: Sitting, Cuff Size: Normal)   Pulse 99   Temp 98.6 F (37 C) (Oral)   Wt 273 lb 3.2 oz (123.9 kg)   R lower leg across shin is tender and bruised. Good circulation and no edema.      Assessment & Plan:   F/u in 3 mo w/ labs: A1C, Met C, CBC, Lipid, UA, Microalbumin

## 2016-11-04 NOTE — Telephone Encounter (Signed)
Placed signed application/script in [facility] folder for pickup. 

## 2016-11-05 ENCOUNTER — Other Ambulatory Visit: Payer: Self-pay | Admitting: Adult Health Nurse Practitioner

## 2016-11-05 MED ORDER — INSULIN GLULISINE 100 UNIT/ML IJ SOLN: 35.0000 [IU] | Freq: Three times a day (TID) | INTRAMUSCULAR | 11 refills | Status: DC

## 2016-11-09 ENCOUNTER — Telehealth: Payer: Self-pay | Admitting: Pharmacist

## 2016-11-09 NOTE — Telephone Encounter (Signed)
11/09/16 Lantus Solostar Pen-Inject 50 units under the skin daily at bedtime. Faxed refill request to [employer], also page [id number] to change in provider to Dr. Mable Fill.

## 2016-11-12 ENCOUNTER — Telehealth: Payer: Self-pay

## 2016-11-12 NOTE — Telephone Encounter (Signed)
Received PAP application from [facility] for [place] placed for provider to sign.

## 2016-11-13 NOTE — Telephone Encounter (Signed)
Placed signed application/script in [facility] folder for pickup. 

## 2016-11-25 ENCOUNTER — Other Ambulatory Visit: Payer: Self-pay

## 2016-11-25 DIAGNOSIS — Z794 Long term (current) use of insulin: Secondary | ICD-10-CM

## 2016-11-25 DIAGNOSIS — E119 Type 2 diabetes mellitus without complications: Secondary | ICD-10-CM

## 2016-11-26 LAB — CBC
Hematocrit: 40 % (ref 37.5–51.0)
Hemoglobin: 13.8 g/dL (ref 13.0–17.7)
MCH: 31.2 pg (ref 26.6–33.0)
MCHC: 34.5 g/dL (ref 31.5–35.7)
MCV: 91 fL (ref 79–97)
PLATELETS: 85 10*3/uL — AB (ref 150–379)
RBC: 4.42 x10E6/uL (ref 4.14–5.80)
RDW: 14.6 % (ref 12.3–15.4)
WBC: 6.5 10*3/uL (ref 3.4–10.8)

## 2016-12-01 ENCOUNTER — Telehealth: Payer: Self-pay | Admitting: Pharmacy Technician

## 2016-12-01 NOTE — Telephone Encounter (Signed)
Patient stated that both he and wife are currently unemployed.  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  Patient and spouse are to obtain letters indicating how much they are paid monthly for their odd jobs.  Patient indicated that spouse is trying to obtain employment with [employer].   Explained that no medication assistance can be provided until [facility] receives letters from both patient and spouse verifying income from odd jobs.  Patient acknowledged that he understood.   Provided patient with resource material based on his particular situation.   Referred for MTM.  [facility]

## 2017-01-11 ENCOUNTER — Telehealth: Payer: Self-pay | Admitting: Pharmacy Technician

## 2017-01-11 ENCOUNTER — Ambulatory Visit: Payer: Self-pay | Admitting: Pharmacist

## 2017-01-11 VITALS — BP 142/78 | Ht 68.0 in | Wt 258.0 lb

## 2017-01-11 DIAGNOSIS — Z79899 Other long term (current) drug therapy: Secondary | ICD-10-CM

## 2017-01-11 NOTE — Progress Notes (Signed)
[facility] Visit Note  Patient: Michael Mack MRN: [id number] Date of Birth: 10-26-1960 PCP: Tawni Millers, MD   [name] 56 y.o. male presents for his annual medication therapy management review with the pharmacist today.  BP (!) 142/78 (BP Location: Right Arm, Patient Position: Sitting, Cuff Size: Large)   Ht '5\' 8"'  (1.727 m)   Wt 258 lb (117 kg)   BMI 39.23 kg/m   Patient Information   Past Medical History:  Diagnosis Date  . Bulging lumbar disc   . Diabetes mellitus without complication ([place])   . GERD (gastroesophageal reflux disease)   . Hepatitis C   . Hypertension   . Migraines       Past Surgical History:  Procedure Laterality Date  . KNEE SURGERY     Right Knee x 3, Left Knee x 1     Family History  Problem Relation Age of Onset  . Cancer Mother     New Diagnoses (since last visit):   Family Support: Good  Lifestyle Diet: Breakfast:Cereal with 2% milk or oatmeal Lunch: Snacks or sandwich Dinner: Fried Metallurgist and vegatables Drinks: Facilities manager or [employer], some water    Current Exercise Habits: The patient does not participate in regular exercise at present  Exercise limited by: orthopedic condition(s)    History  Alcohol Use No      History  Smoking Status  . Never Smoker  Smokeless Tobacco  . Never Used      Health Maintenance  Topic Date Due  . PNEUMOCOCCAL POLYSACCHARIDE VACCINE (1) 04/23/1963  . FOOT EXAM  04/23/1971  . OPHTHALMOLOGY EXAM  04/23/1971  . HIV Screening  04/22/1976  . TETANUS/TDAP  04/22/1980  . COLONOSCOPY  04/23/2011  . INFLUENZA VACCINE  01/20/2017  . HEMOGLOBIN A1C  03/19/2017  . Hepatitis C Screening  Completed    Outpatient Encounter Prescriptions as of 01/11/2017  Medication Sig  . acetaminophen (TYLENOL) 500 MG tablet Take 500 mg by mouth as needed.  . ALPRAZolam (XANAX) 1 MG tablet Take 1 mg by mouth 3 (three) times daily.  Marland Kitchen amitriptyline (ELAVIL) 50 MG  tablet Take 50 mg by mouth at bedtime. Uses for sleep  . hydrochlorothiazide (HYDRODIURIL) 25 MG tablet Take 1 tablet (25 mg total) by mouth daily.  . insulin aspart (NOVOLOG) 100 UNIT/ML injection Inject 35 Units into the skin 3 (three) times daily with meals.  . Insulin Glargine (LANTUS) 100 UNIT/ML Solostar Pen Inject 50 Units into the skin at bedtime.  Marland Kitchen lisinopril (PRINIVIL,ZESTRIL) 40 MG tablet Take 1 tablet (40 mg total) by mouth daily.  . methadone (DOLOPHINE) 10 MG tablet Take 10 mg by mouth daily.  . ranitidine (ZANTAC) 150 MG tablet Take 1 tablet (150 mg total) by mouth daily.  . insulin glulisine (APIDRA) 100 UNIT/ML injection Inject 0.35 mLs (35 Units total) into the skin 3 (three) times daily with meals. (Patient not taking: Reported on 01/11/2017)  . [DISCONTINUED] Daclatasvir Dihydrochloride 60 MG TABS Take 60 mg by mouth daily.  . [DISCONTINUED] mirtazapine (REMERON) 45 MG tablet Take 45 mg by mouth at bedtime.   No facility-administered encounter medications on file as of 01/11/2017.    Health Maintenance/Date Completed  Last Visit to PCP: 10/2016 Next Visit to PCP: 01/2017 Specialist Visit: Aug or Sept 2018 [facility] Dental Exam: none recent Eye Exam: 2017 Prostate Exam: ? Pelvic/PAP Exam: n/a Mammogram: n/a DEXA:  Colonoscopy: At hospital Flu Vaccine: none recent Pneumonia Vaccine:  not sure  ASSESSMENT:  Diabetes: checking up to 3 times per day. AM blood sugar ranges: 130-140 mg/dl. Before lunch reading = 150 mg/dl.  States he received info from [facility] regarding dental assistance through [facility]. Not currently on aspirin or a statin; hx of liver disease. Disability: Patient has applied for disability and has been denied. He is motivated to reapply with an attorney. He has a 25 lb weight limit restriction and back and knee pain which prohibit him from obtaining work. Compliance: Uses a pill box. States he remembers to take his medications. Patient recently turned  in proof of income documentation. Hypertension: Not controlled per today's reading of 142/78 mmHg. Currently on HCTZ and lisinopril. Reflux: Controlled with daily ranitidine. Liver: Sees NP Aline Brochure at [facility]. Hep C resolved last year, per patient. Has Fatty Liver Disease that is being monitored. Hx of Cirrhosis with portal hypertension and varices and hepatic encephalopathy. Mood/Sleep/Anxiety: Follow up with Dr. Miles Costain Aug 15 th, 2018. Current regimen includes amitriptyline and alprazolam Health Maintenance: Per patient, Echo and EKG completed at the hospital Pain Management: Sees a provider at [facility] for pain and methadone management. Labs:  A1c = 7.8% (09/16/16) TC =153 mg/dl; TG = 75 mg/dl; HDL = 60 mg/dl: LDL = 78 mg/dl (05/2016) CBC: PLT = 85; (11-25-16)  CMP: Scr = 1.23; ALK Phos, AST, ALT = WNL (09-16-16)  PLAN: Signed Lantus renewal PAP form today.  Apidra solostar application on order, patient turned in updated proof of income last month Encouraged patient to reach out to [facility] again about dental assistance Encouraged patient to obtain and advocate or lawyer to reapply for disability [facility] Follow Up for labs; 02/03/17 [facility] Follow Up appointment; 02/10/17 Follow Up with pharmacist 04/19/17 at [facility]   Laurieann Friddle K. Dicky Doe, PharmD [facility] [place] Operations Coordinator [phone]

## 2017-01-11 NOTE — Telephone Encounter (Signed)
Patient eligible to receive medication assistance at [facility] through 2018, as long as eligibility requirements continue to be met.  Patient just provided monthly household income.  Lantus and Apidra PAP application submitted to [facility].    [place] [facility]

## 2017-02-03 ENCOUNTER — Other Ambulatory Visit: Payer: Self-pay

## 2017-02-04 ENCOUNTER — Telehealth: Payer: Self-pay | Admitting: Adult Health Nurse Practitioner

## 2017-02-04 NOTE — Telephone Encounter (Signed)
Left message for pt stating that [facility] would like to schedule an appt, left number and open hours, requested that pt call back

## 2017-02-10 ENCOUNTER — Ambulatory Visit: Payer: Self-pay | Admitting: Internal Medicine

## 2017-02-10 ENCOUNTER — Encounter: Payer: Self-pay | Admitting: Internal Medicine

## 2017-02-10 VITALS — BP 139/73 | HR 99 | Ht 68.4 in | Wt 265.0 lb

## 2017-02-10 DIAGNOSIS — E119 Type 2 diabetes mellitus without complications: Secondary | ICD-10-CM

## 2017-02-10 DIAGNOSIS — Z794 Long term (current) use of insulin: Secondary | ICD-10-CM

## 2017-02-10 LAB — GLUCOSE, POCT (MANUAL RESULT ENTRY): POC GLUCOSE: 197 mg/dL — AB (ref 70–99)

## 2017-02-10 NOTE — Addendum Note (Signed)
Addended by: Meda Klinefelter D on: 02/10/2017 11:50 AM   Modules accepted: Orders

## 2017-02-10 NOTE — Progress Notes (Signed)
   Subjective:    Patient ID: Michael Mack, male    DOB: Nov 09, 1960, 56 y.o.   MRN: [id number]  HPI   Pt was not able to reschedule lab tests from previous week due to a family emergency. Pt states he had an echocardiogram and EKG but there are no records. Pt checks his sugar levels at least 3 times a day; readings are between 130-200.     Patient Active Problem List   Diagnosis Date Noted  . Thrombocytopenia ([place]) 09/11/2015  . Cirrhosis ([place]) 09/11/2015  . Hepatitis C 04/18/2015  . Diabetes ([place]) 01/01/2015  . GERD (gastroesophageal reflux disease) 01/01/2015  . Hypertension 01/01/2015  . Fatty liver 01/01/2015   Allergies as of 02/10/2017   No Known Allergies     Medication List       Accurate as of 02/10/17 11:15 AM. Always use your most recent med list.          acetaminophen 500 MG tablet Commonly known as:  TYLENOL Take 500 mg by mouth as needed.   ALPRAZolam 1 MG tablet Commonly known as:  XANAX Take 1 mg by mouth 3 (three) times daily.   amitriptyline 50 MG tablet Commonly known as:  ELAVIL Take 50 mg by mouth at bedtime. Uses for sleep   hydrochlorothiazide 25 MG tablet Commonly known as:  HYDRODIURIL Take 1 tablet (25 mg total) by mouth daily.   insulin aspart 100 UNIT/ML injection Commonly known as:  novoLOG Inject 35 Units into the skin 3 (three) times daily with meals.   Insulin Glargine 100 UNIT/ML Solostar Pen Commonly known as:  LANTUS Inject 50 Units into the skin at bedtime.   insulin glulisine 100 UNIT/ML injection Commonly known as:  APIDRA Inject 0.35 mLs (35 Units total) into the skin 3 (three) times daily with meals.   lisinopril 40 MG tablet Commonly known as:  PRINIVIL,ZESTRIL Take 1 tablet (40 mg total) by mouth daily.   methadone 10 MG tablet Commonly known as:  DOLOPHINE Take 10 mg by mouth daily.   ranitidine 150 MG tablet Commonly known as:  ZANTAC Take 1 tablet (150 mg total) by mouth daily.        Review of  Systems     Objective:   Physical Exam  BP 139/73   Pulse 99   Ht 5' 8.4" (1.737 m)   Wt 265 lb (120.2 kg)   BMI 39.82 kg/m        Assessment & Plan:   Pt does not need any medication refills Labs completed today  F/u in 3 months with labs

## 2017-02-11 LAB — CBC
Hematocrit: 40.9 % (ref 37.5–51.0)
Hemoglobin: 13.9 g/dL (ref 13.0–17.7)
MCH: 30.8 pg (ref 26.6–33.0)
MCHC: 34 g/dL (ref 31.5–35.7)
MCV: 91 fL (ref 79–97)
Platelets: 77 10*3/uL — CL (ref 150–379)
RBC: 4.51 x10E6/uL (ref 4.14–5.80)
RDW: 14.9 % (ref 12.3–15.4)
WBC: 5.1 10*3/uL (ref 3.4–10.8)

## 2017-02-11 LAB — COMPREHENSIVE METABOLIC PANEL
ALK PHOS: 53 IU/L (ref 39–117)
ALT: 27 IU/L (ref 0–44)
AST: 45 IU/L — AB (ref 0–40)
Albumin/Globulin Ratio: 1.2 (ref 1.2–2.2)
Albumin: 3.9 g/dL (ref 3.5–5.5)
BUN/Creatinine Ratio: 21 — ABNORMAL HIGH (ref 9–20)
BUN: 16 mg/dL (ref 6–24)
Bilirubin Total: 0.5 mg/dL (ref 0.0–1.2)
CALCIUM: 8.3 mg/dL — AB (ref 8.7–10.2)
CO2: 22 mmol/L (ref 20–29)
CREATININE: 0.77 mg/dL (ref 0.76–1.27)
Chloride: 102 mmol/L (ref 96–106)
GFR calc Af Amer: 118 mL/min/{1.73_m2} (ref >59)
GFR, EST NON AFRICAN AMERICAN: 102 mL/min/{1.73_m2} (ref >59)
GLUCOSE: 167 mg/dL — AB (ref 65–99)
Globulin, Total: 3.2 g/dL (ref 1.5–4.5)
Potassium: 4.2 mmol/L (ref 3.5–5.2)
Sodium: 139 mmol/L (ref 134–144)
Total Protein: 7.1 g/dL (ref 6.0–8.5)

## 2017-02-11 LAB — URINALYSIS
BILIRUBIN UA: NEGATIVE
KETONES UA: NEGATIVE
NITRITE UA: NEGATIVE
Protein, UA: NEGATIVE
RBC UA: NEGATIVE
SPEC GRAV UA: 1.012 (ref 1.005–1.030)
UUROB: 0.2 mg/dL (ref 0.2–1.0)
pH, UA: 5 (ref 5.0–7.5)

## 2017-02-11 LAB — HEMOGLOBIN A1C
ESTIMATED AVERAGE GLUCOSE: 186 mg/dL
HEMOGLOBIN A1C: 8.1 % — AB (ref 4.8–5.6)

## 2017-02-18 ENCOUNTER — Telehealth: Payer: Self-pay

## 2017-02-18 NOTE — Telephone Encounter (Signed)
Called pt with results. Pt verbalized understanding.

## 2017-03-02 ENCOUNTER — Telehealth: Payer: Self-pay | Admitting: Pharmacist

## 2017-03-02 NOTE — Telephone Encounter (Signed)
03/02/17 Sending refill request to [facility] for providers to sign: Lantus Solostar Inject 50 units under the skin daily at bedtime # 2 boxes & Apidra Solostar Inject 0.35 mLs (35 units) into the skin 3 times daily with meals # 6.Michael Mack

## 2017-03-19 ENCOUNTER — Telehealth: Payer: Self-pay | Admitting: Pharmacist

## 2017-03-19 NOTE — Telephone Encounter (Signed)
03/19/17 Faxed [employer] refill request for Lantus Solostar pen Inject 50 units under the skin daily at bedtime, # 2 boxes.AJ 03/19/17 Faxed [employer] refill request for Apidra Solostar Pen Inject 35 units three times a day with meals, # 6. Delos Haring

## 2017-04-01 ENCOUNTER — Telehealth: Payer: Self-pay

## 2017-04-01 NOTE — Telephone Encounter (Signed)
Received PAP application from [facility] for lantus solostar placed for provider to sign.

## 2017-04-01 NOTE — Telephone Encounter (Signed)
Placed signed application/script in [facility] folder for pickup. 

## 2017-04-13 ENCOUNTER — Telehealth: Payer: Self-pay | Admitting: Adult Health Nurse Practitioner

## 2017-04-13 ENCOUNTER — Telehealth: Payer: Self-pay | Admitting: Pharmacist

## 2017-04-13 NOTE — Telephone Encounter (Signed)
Wanted to know when his next appointment is. Call back

## 2017-04-13 NOTE — Telephone Encounter (Signed)
04/13/17 Faxed [employer] a refill request for Lantus Solostar Pens Inject 50 units under the skin daily at bedtime # 2. There was a delay in getting the request back from [facility], Teah signed the application and Dr. Mable Fill needed to sign.Michael Mack

## 2017-04-28 ENCOUNTER — Other Ambulatory Visit: Payer: Self-pay

## 2017-04-29 ENCOUNTER — Other Ambulatory Visit: Payer: Self-pay

## 2017-05-04 ENCOUNTER — Other Ambulatory Visit: Payer: Self-pay

## 2017-05-04 DIAGNOSIS — E119 Type 2 diabetes mellitus without complications: Secondary | ICD-10-CM

## 2017-05-04 DIAGNOSIS — Z794 Long term (current) use of insulin: Secondary | ICD-10-CM

## 2017-05-05 ENCOUNTER — Encounter: Payer: Self-pay | Admitting: Internal Medicine

## 2017-05-05 ENCOUNTER — Ambulatory Visit: Payer: Self-pay | Admitting: Internal Medicine

## 2017-05-05 VITALS — BP 144/81 | HR 105 | Temp 98.1°F | Wt 260.6 lb

## 2017-05-05 DIAGNOSIS — E119 Type 2 diabetes mellitus without complications: Secondary | ICD-10-CM

## 2017-05-05 LAB — LIPID PANEL
CHOL/HDL RATIO: 3 ratio (ref 0.0–5.0)
Cholesterol, Total: 172 mg/dL (ref 100–199)
HDL: 57 mg/dL (ref >39)
LDL Calculated: 89 mg/dL (ref 0–99)
TRIGLYCERIDES: 131 mg/dL (ref 0–149)
VLDL Cholesterol Cal: 26 mg/dL (ref 5–40)

## 2017-05-05 LAB — COMPREHENSIVE METABOLIC PANEL
A/G RATIO: 1.1 — AB (ref 1.2–2.2)
ALT: 17 IU/L (ref 0–44)
AST: 19 IU/L (ref 0–40)
Albumin: 4 g/dL (ref 3.5–5.5)
Alkaline Phosphatase: 77 IU/L (ref 39–117)
BILIRUBIN TOTAL: 0.6 mg/dL (ref 0.0–1.2)
BUN / CREAT RATIO: 16 (ref 9–20)
BUN: 14 mg/dL (ref 6–24)
CHLORIDE: 103 mmol/L (ref 96–106)
CO2: 24 mmol/L (ref 20–29)
Calcium: 9.4 mg/dL (ref 8.7–10.2)
Creatinine, Ser: 0.9 mg/dL (ref 0.76–1.27)
GFR calc non Af Amer: 95 mL/min/{1.73_m2} (ref >59)
GFR, EST AFRICAN AMERICAN: 110 mL/min/{1.73_m2} (ref >59)
GLOBULIN, TOTAL: 3.8 g/dL (ref 1.5–4.5)
Glucose: 222 mg/dL — ABNORMAL HIGH (ref 65–99)
POTASSIUM: 3.9 mmol/L (ref 3.5–5.2)
SODIUM: 141 mmol/L (ref 134–144)
TOTAL PROTEIN: 7.8 g/dL (ref 6.0–8.5)

## 2017-05-05 LAB — URINALYSIS
Bilirubin, UA: NEGATIVE
KETONES UA: NEGATIVE
Nitrite, UA: NEGATIVE
PROTEIN UA: NEGATIVE
Specific Gravity, UA: 1.014 (ref 1.005–1.030)
Urobilinogen, Ur: 0.2 mg/dL (ref 0.2–1.0)
pH, UA: 6 (ref 5.0–7.5)

## 2017-05-05 LAB — CBC
HEMATOCRIT: 45.1 % (ref 37.5–51.0)
HEMOGLOBIN: 15.6 g/dL (ref 13.0–17.7)
MCH: 30.6 pg (ref 26.6–33.0)
MCHC: 34.6 g/dL (ref 31.5–35.7)
MCV: 89 fL (ref 79–97)
Platelets: 85 10*3/uL — CL (ref 150–379)
RBC: 5.09 x10E6/uL (ref 4.14–5.80)
RDW: 14.7 % (ref 12.3–15.4)
WBC: 6 10*3/uL (ref 3.4–10.8)

## 2017-05-05 LAB — POCT GLUCOSE (DEVICE FOR HOME USE): Glucose Fasting, POC: 215 mg/dL — AB (ref 70–99)

## 2017-05-05 LAB — POCT GLYCOSYLATED HEMOGLOBIN (HGB A1C): HEMOGLOBIN A1C: 7.7

## 2017-05-05 MED ORDER — INSULIN ASPART 100 UNIT/ML ~~LOC~~ SOLN
35.0000 [IU] | Freq: Three times a day (TID) | SUBCUTANEOUS | 3 refills | Status: DC
Start: 2017-05-05 — End: 2017-05-05

## 2017-05-05 NOTE — Addendum Note (Signed)
Addended by: Colan Neptune E on: 05/05/2017 12:01 PM   Modules accepted: Orders

## 2017-05-05 NOTE — Progress Notes (Signed)
   Subjective:    Patient ID: Michael Mack, male    DOB: 03-03-1961, 56 y.o.   MRN: [id number]  HPI Patient Active Problem List   Diagnosis Date Noted  . Thrombocytopenia ([place]) 09/11/2015  . Cirrhosis ([place]) 09/11/2015  . Hepatitis C 04/18/2015  . Diabetes ([place]) 01/01/2015  . GERD (gastroesophageal reflux disease) 01/01/2015  . Hypertension 01/01/2015  . Fatty liver 01/01/2015   Pt here for follow up. A1C taken today.    Review of Systems BP (!) 144/81   Pulse (!) 105   Temp 98.1 F (36.7 C)   Wt 260 lb 9.6 oz (118.2 kg)   BMI 39.16 kg/m   Current Outpatient Medications on File Prior to Visit  Medication Sig Dispense Refill  . acetaminophen (TYLENOL) 500 MG tablet Take 500 mg by mouth as needed.    . ALPRAZolam (XANAX) 1 MG tablet Take 1 mg by mouth 3 (three) times daily.    Marland Kitchen amitriptyline (ELAVIL) 50 MG tablet Take 50 mg by mouth at bedtime. Uses for sleep    . hydrochlorothiazide (HYDRODIURIL) 25 MG tablet Take 1 tablet (25 mg total) by mouth daily. 90 tablet 3  . insulin aspart (NOVOLOG) 100 UNIT/ML injection Inject 35 Units into the skin 3 (three) times daily with meals.    . Insulin Glargine (LANTUS) 100 UNIT/ML Solostar Pen Inject 50 Units into the skin at bedtime. 15 mL 11  . insulin glulisine (APIDRA) 100 UNIT/ML injection Inject 0.35 mLs (35 Units total) into the skin 3 (three) times daily with meals. 10 mL 11  . lisinopril (PRINIVIL,ZESTRIL) 40 MG tablet Take 1 tablet (40 mg total) by mouth daily. 90 tablet 3  . methadone (DOLOPHINE) 10 MG tablet Take 10 mg by mouth daily.    . ranitidine (ZANTAC) 150 MG tablet Take 1 tablet (150 mg total) by mouth daily. 90 tablet 3   No current facility-administered medications on file prior to visit.        Objective:   Physical Exam  Constitutional: He is oriented to person, place, and time.  Cardiovascular: Normal rate, regular rhythm and normal heart sounds.  Pulmonary/Chest: Effort normal and breath sounds normal.   Neurological: He is alert and oriented to person, place, and time.          Assessment & Plan:  Labs look good. Platelet counts remain stable. Pt sounds good today. RTC 3 months. Labs the week before met c, cbc, a1c, UA, tsh and PSA.

## 2017-05-24 ENCOUNTER — Telehealth: Payer: Self-pay

## 2017-05-24 NOTE — Telephone Encounter (Signed)
Patient cancelled appointment with Nira Conn to fill out [facility] application for EKG he had received a bill for patient did not want to reschedule the appointment at this time.

## 2017-05-25 ENCOUNTER — Ambulatory Visit: Payer: Self-pay

## 2017-06-10 ENCOUNTER — Telehealth: Payer: Self-pay | Admitting: Pharmacist

## 2017-06-10 NOTE — Telephone Encounter (Signed)
06/10/17 Faxed [employer] refill request for [name] 35 units under the skin 3 times a day with meals # 6.Delos Haring

## 2017-07-02 ENCOUNTER — Telehealth: Payer: Self-pay | Admitting: Pharmacist

## 2017-07-02 NOTE — Telephone Encounter (Signed)
07/02/2017 Faxed refill request to [facility] for : Lantus Solostar pens Inject 50 units under the skin daily at bedtime # 2 & Apidra Solostar pens Inject 35 units under the skin 3 times a day with meals # 6.Delos Haring

## 2017-07-15 ENCOUNTER — Other Ambulatory Visit: Payer: Self-pay | Admitting: Adult Health Nurse Practitioner

## 2017-07-15 MED ORDER — INSULIN DETEMIR 100 UNIT/ML FLEXPEN: 50.0000 [IU] | PEN_INJECTOR | Freq: Every day | SUBCUTANEOUS | 11 refills | Status: DC

## 2017-07-22 ENCOUNTER — Telehealth: Payer: Self-pay | Admitting: Pharmacist

## 2017-07-22 NOTE — Telephone Encounter (Signed)
07/22/17 Printed [employer] refill request for [name] 35 units under the skin three times daily with meals, sending to [facility] for provider to sign and return.Delos Haring

## 2017-07-28 ENCOUNTER — Other Ambulatory Visit: Payer: Self-pay

## 2017-07-28 DIAGNOSIS — E119 Type 2 diabetes mellitus without complications: Secondary | ICD-10-CM

## 2017-07-29 LAB — COMPREHENSIVE METABOLIC PANEL
A/G RATIO: 1.2 (ref 1.2–2.2)
ALK PHOS: 82 IU/L (ref 39–117)
ALT: 27 IU/L (ref 0–44)
AST: 27 IU/L (ref 0–40)
Albumin: 4.2 g/dL (ref 3.5–5.5)
BUN/Creatinine Ratio: 20 (ref 9–20)
BUN: 17 mg/dL (ref 6–24)
Bilirubin Total: 0.6 mg/dL (ref 0.0–1.2)
CHLORIDE: 104 mmol/L (ref 96–106)
CO2: 22 mmol/L (ref 20–29)
CREATININE: 0.83 mg/dL (ref 0.76–1.27)
Calcium: 9.7 mg/dL (ref 8.7–10.2)
GFR calc Af Amer: 114 mL/min/{1.73_m2} (ref >59)
GFR calc non Af Amer: 98 mL/min/{1.73_m2} (ref >59)
GLOBULIN, TOTAL: 3.6 g/dL (ref 1.5–4.5)
Glucose: 106 mg/dL — ABNORMAL HIGH (ref 65–99)
Potassium: 4 mmol/L (ref 3.5–5.2)
SODIUM: 143 mmol/L (ref 134–144)
Total Protein: 7.8 g/dL (ref 6.0–8.5)

## 2017-07-29 LAB — CBC WITH DIFFERENTIAL
BASOS ABS: 0 10*3/uL (ref 0.0–0.2)
Basos: 0 %
EOS (ABSOLUTE): 0.2 10*3/uL (ref 0.0–0.4)
Eos: 3 %
Hematocrit: 44.2 % (ref 37.5–51.0)
Hemoglobin: 15.4 g/dL (ref 13.0–17.7)
IMMATURE GRANS (ABS): 0 10*3/uL (ref 0.0–0.1)
IMMATURE GRANULOCYTES: 0 %
LYMPHS: 30 %
Lymphocytes Absolute: 1.9 10*3/uL (ref 0.7–3.1)
MCH: 31.1 pg (ref 26.6–33.0)
MCHC: 34.8 g/dL (ref 31.5–35.7)
MCV: 89 fL (ref 79–97)
Monocytes Absolute: 0.4 10*3/uL (ref 0.1–0.9)
Monocytes: 7 %
NEUTROS PCT: 60 %
Neutrophils Absolute: 3.8 10*3/uL (ref 1.4–7.0)
RBC: 4.95 x10E6/uL (ref 4.14–5.80)
RDW: 15 % (ref 12.3–15.4)
WBC: 6.2 10*3/uL (ref 3.4–10.8)

## 2017-07-29 LAB — URINALYSIS
Bilirubin, UA: NEGATIVE
KETONES UA: NEGATIVE
LEUKOCYTES UA: NEGATIVE
Nitrite, UA: NEGATIVE
PROTEIN UA: NEGATIVE
RBC, UA: NEGATIVE
SPEC GRAV UA: 1.02 (ref 1.005–1.030)
Urobilinogen, Ur: 0.2 mg/dL (ref 0.2–1.0)
pH, UA: 6.5 (ref 5.0–7.5)

## 2017-07-29 LAB — HEMOGLOBIN A1C
ESTIMATED AVERAGE GLUCOSE: 189 mg/dL
HEMOGLOBIN A1C: 8.2 % — AB (ref 4.8–5.6)

## 2017-07-29 LAB — TSH: TSH: 2.58 u[IU]/mL (ref 0.450–4.500)

## 2017-07-29 LAB — PSA: Prostate Specific Ag, Serum: <0.1 ng/mL (ref 0.0–4.0)

## 2017-08-04 ENCOUNTER — Ambulatory Visit: Payer: Self-pay | Admitting: Internal Medicine

## 2017-08-12 ENCOUNTER — Ambulatory Visit: Payer: Self-pay | Admitting: Adult Health Nurse Practitioner

## 2017-08-12 VITALS — BP 118/58 | Temp 98.8°F | Wt 258.3 lb

## 2017-08-12 DIAGNOSIS — K219 Gastro-esophageal reflux disease without esophagitis: Secondary | ICD-10-CM

## 2017-08-12 DIAGNOSIS — E119 Type 2 diabetes mellitus without complications: Secondary | ICD-10-CM

## 2017-08-12 DIAGNOSIS — R600 Localized edema: Secondary | ICD-10-CM

## 2017-08-12 DIAGNOSIS — Z794 Long term (current) use of insulin: Secondary | ICD-10-CM

## 2017-08-12 DIAGNOSIS — I10 Essential (primary) hypertension: Secondary | ICD-10-CM

## 2017-08-12 NOTE — Progress Notes (Signed)
   Subjective:    Patient ID: Michael Mack, male    DOB: 1960-08-02, 57 y.o.   MRN: [id number]  HPI  Michael Mack is a 57 yo male here for f/u of diabetes and for L leg pain and dental pain.  His last A1c on 07/28/17 is 8.2 from 7.7. He endorses he is taking Lantus 50 units and Apidra 35 units TID as Rx.  Pt complains of dental pain after having 3 teeth pulled 2 weeks ago. Denies problem eating. Pt complains of L leg pain and edema w/ unknown cause below the knee for 1 week. Pt reports initially it swelled up and felt hot to touch. Pt reports sometimes both legs are tender but not for 6 mo.   Patient Active Problem List   Diagnosis Date Noted  . Thrombocytopenia ([place]) 09/11/2015  . Cirrhosis ([place]) 09/11/2015  . Hepatitis C 04/18/2015  . Diabetes ([place]) 01/01/2015  . GERD (gastroesophageal reflux disease) 01/01/2015  . Hypertension 01/01/2015  . Fatty liver 01/01/2015   Allergies as of 08/12/2017   No Known Allergies     Medication List        Accurate as of 08/12/17  6:23 PM. Always use your most recent med list.          acetaminophen 500 MG tablet Commonly known as:  TYLENOL Take 500 mg by mouth as needed.   ALPRAZolam 1 MG tablet Commonly known as:  XANAX Take 1 mg by mouth 3 (three) times daily.   amitriptyline 50 MG tablet Commonly known as:  ELAVIL Take 50 mg by mouth at bedtime. Uses for sleep   hydrochlorothiazide 25 MG tablet Commonly known as:  HYDRODIURIL Take 1 tablet (25 mg total) by mouth daily.   Insulin Glargine 100 UNIT/ML Solostar Pen Commonly known as:  LANTUS Inject 50 Units into the skin at bedtime.   insulin glulisine 100 UNIT/ML injection Commonly known as:  APIDRA Inject 0.35 mLs (35 Units total) into the skin 3 (three) times daily with meals.   lisinopril 40 MG tablet Commonly known as:  PRINIVIL,ZESTRIL Take 1 tablet (40 mg total) by mouth daily.   methadone 10 MG tablet Commonly known as:  DOLOPHINE Take 10 mg by mouth daily.     ranitidine 150 MG tablet Commonly known as:  ZANTAC Take 1 tablet (150 mg total) by mouth daily.         Review of Systems Labs reviewed. Pt endorses he is taking meds as Rx.     Objective:   Physical Exam  Constitutional: He is oriented to person, place, and time. He appears well-developed and well-nourished.  Cardiovascular: Normal rate, regular rhythm and normal heart sounds.  Pulmonary/Chest: Effort normal and breath sounds normal.  Abdominal: Soft. Bowel sounds are normal.  Musculoskeletal:       Left lower leg: He exhibits tenderness and edema.  Neurological: He is alert and oriented to person, place, and time.  Vitals reviewed.   BP (!) 118/58   Temp 98.8 F (37.1 C)   Wt 258 lb 4.8 oz (117.2 kg)   BMI 38.82 kg/m       Assessment & Plan:   Dental pain: pt will f/u w/ dentist Leg pain: ordered venous doppler bilateral Continue current POC for DM and HTN.  Encouraged exercise and diet modifications.   F/u in 3 mo w/ labs 1 week before.

## 2017-08-19 ENCOUNTER — Other Ambulatory Visit: Payer: Self-pay

## 2017-08-19 ENCOUNTER — Encounter: Payer: Self-pay | Admitting: Emergency Medicine

## 2017-08-19 ENCOUNTER — Emergency Department: Payer: Self-pay

## 2017-08-19 ENCOUNTER — Emergency Department
Admission: EM | Admit: 2017-08-19 | Discharge: 2017-08-19 | Disposition: A | Discharge status: Home / Self Care | Payer: Self-pay | Attending: Emergency Medicine | Admitting: Emergency Medicine

## 2017-08-19 DIAGNOSIS — S5001XA Contusion of right elbow, initial encounter: Secondary | ICD-10-CM | POA: Insufficient documentation

## 2017-08-19 DIAGNOSIS — Y999 Unspecified external cause status: Secondary | ICD-10-CM | POA: Insufficient documentation

## 2017-08-19 DIAGNOSIS — E119 Type 2 diabetes mellitus without complications: Secondary | ICD-10-CM | POA: Insufficient documentation

## 2017-08-19 DIAGNOSIS — Z79899 Other long term (current) drug therapy: Secondary | ICD-10-CM | POA: Insufficient documentation

## 2017-08-19 DIAGNOSIS — S5011XA Contusion of right forearm, initial encounter: Secondary | ICD-10-CM

## 2017-08-19 DIAGNOSIS — I10 Essential (primary) hypertension: Secondary | ICD-10-CM | POA: Insufficient documentation

## 2017-08-19 DIAGNOSIS — Z794 Long term (current) use of insulin: Secondary | ICD-10-CM | POA: Insufficient documentation

## 2017-08-19 DIAGNOSIS — W010XXA Fall on same level from slipping, tripping and stumbling without subsequent striking against object, initial encounter: Secondary | ICD-10-CM | POA: Insufficient documentation

## 2017-08-19 DIAGNOSIS — W19XXXA Unspecified fall, initial encounter: Secondary | ICD-10-CM

## 2017-08-19 DIAGNOSIS — Y939 Activity, unspecified: Secondary | ICD-10-CM | POA: Insufficient documentation

## 2017-08-19 DIAGNOSIS — S63501A Unspecified sprain of right wrist, initial encounter: Secondary | ICD-10-CM | POA: Insufficient documentation

## 2017-08-19 DIAGNOSIS — Y92008 Other place in unspecified non-institutional (private) residence as the place of occurrence of the external cause: Secondary | ICD-10-CM | POA: Insufficient documentation

## 2017-08-19 MED ORDER — IBUPROFEN 600 MG PO TABS: 600.0000 mg | ORAL_TABLET | Freq: Three times a day (TID) | ORAL | 0 refills | Status: DC | PRN

## 2017-08-19 MED ORDER — OXYCODONE-ACETAMINOPHEN 5-325 MG PO TABS
1.0000 | ORAL_TABLET | Freq: Once | ORAL | Status: AC
  Administered 2017-08-19: 1 via ORAL
  Filled 2017-08-19: qty 1

## 2017-08-19 MED ORDER — OXYCODONE-ACETAMINOPHEN 7.5-325 MG PO TABS: 1.0000 | ORAL_TABLET | Freq: Four times a day (QID) | ORAL | 0 refills | Status: DC | PRN

## 2017-08-19 MED ORDER — IBUPROFEN 600 MG PO TABS
600.0000 mg | ORAL_TABLET | Freq: Once | ORAL | Status: AC
  Administered 2017-08-19: 600 mg via ORAL
  Filled 2017-08-19: qty 1

## 2017-08-19 NOTE — ED Notes (Signed)
See triage note  Presents with pain to right wrist and elbow s/p fall last pm  Also states he hit his head  No loc  Min swelling noted to wrist  Abrasion noted to right elbow.Marland Kitchengood pulses

## 2017-08-19 NOTE — ED Triage Notes (Signed)
Pt arrived via POV from home with wife with reports of right wrist and elbow pain s/p fall last night in the garage. C/o limited ROM to right wrist and has ace wrap applied.  Pt states he fell backwards on the right shoulder, elbow and wrist and then hit the back of his head. Pt states he is not on any blood thinners or aspirin and had no LOC. Pt alert and oriented in triage. Pt denies any pain in head.

## 2017-08-19 NOTE — ED Provider Notes (Signed)
[facility] Emergency Department Provider Note   ____________________________________________   First MD Initiated Contact with Patient 08/19/17 [phone]     (approximate)  I have reviewed the triage vital signs and the nursing notes.   HISTORY  Chief Complaint Wrist Pain and Fall    HPI Michael Mack is a 57 y.o. male patient complaining of right elbow and right wrist pain secondary to a fall last night.  Patient was trying to manually closed his garage door last night when the rope broke and he fell backwards breaking fall with the right upper extremity.  Patient state he did hit his head but denies LOC.  Patient denies vision disturbance or vertigo.  Patient rates the pain 7/10.  Patient described the pain as "sore/achy".  Ace wrap applied at home.  Past Medical History:  Diagnosis Date  . Bulging lumbar disc   . Diabetes mellitus without complication ([place])   . GERD (gastroesophageal reflux disease)   . Hepatitis C   . Hypertension   . Migraines     Patient Active Problem List   Diagnosis Date Noted  . Thrombocytopenia ([place]) 09/11/2015  . Cirrhosis ([place]) 09/11/2015  . Hepatitis C 04/18/2015  . Diabetes ([place]) 01/01/2015  . GERD (gastroesophageal reflux disease) 01/01/2015  . Hypertension 01/01/2015  . Fatty liver 01/01/2015    Past Surgical History:  Procedure Laterality Date  . KNEE SURGERY     Right Knee x 3, Left Knee x 1    Prior to Admission medications   Medication Sig Start Date End Date Taking? Authorizing Provider  acetaminophen (TYLENOL) 500 MG tablet Take 500 mg by mouth as needed.    [provider]  ALPRAZolam Duanne Moron) 1 MG tablet Take 1 mg by mouth 3 (three) times daily.    [provider]  amitriptyline (ELAVIL) 50 MG tablet Take 50 mg by mouth at bedtime. Uses for sleep    [provider]  hydrochlorothiazide (HYDRODIURIL) 25 MG tablet Take 1 tablet (25 mg total) by mouth daily. 11/04/16   Tawni Millers, MD  ibuprofen (ADVIL,MOTRIN) 600 MG tablet Take 1 tablet (600 mg total) by mouth every 8 (eight) hours as needed. 08/19/17   Sable Feil, PA-C  Insulin Glargine (LANTUS) 100 UNIT/ML Solostar Pen Inject 50 Units into the skin at bedtime. 11/04/16   Tawni Millers, MD  insulin glulisine (APIDRA) 100 UNIT/ML injection Inject 0.35 mLs (35 Units total) into the skin 3 (three) times daily with meals. 11/05/16   Doles-Johnson, Teah, NP  lisinopril (PRINIVIL,ZESTRIL) 40 MG tablet Take 1 tablet (40 mg total) by mouth daily. 11/04/16   Tawni Millers, MD  methadone (DOLOPHINE) 10 MG tablet Take 10 mg by mouth daily.    [provider]  oxyCODONE-acetaminophen (PERCOCET) 7.5-325 MG tablet Take 1 tablet by mouth every 6 (six) hours as needed for severe pain. 08/19/17   Sable Feil, PA-C  ranitidine (ZANTAC) 150 MG tablet Take 1 tablet (150 mg total) by mouth daily. 11/04/16   Tawni Millers, MD    Allergies Patient has no known allergies.  Family History  Problem Relation Age of Onset  . Cancer Mother     Social History Social History   Tobacco Use  . Smoking status: Never Smoker  . Smokeless tobacco: Never Used  Substance Use Topics  . Alcohol use: No  . Drug use: No    Review of Systems Constitutional: No fever/chills Eyes: No visual changes. ENT: No  sore throat. Cardiovascular: Denies chest pain. Respiratory: Denies shortness of breath. Gastrointestinal: No abdominal pain.  No nausea, no vomiting.  No diarrhea.  No constipation. Genitourinary: Negative for dysuria. Musculoskeletal: Negative for back pain. Skin: Negative for rash. Neurological: Negative for headaches, focal weakness or numbness. Endocrine:Diabetes, hepatitis C, and hypertension.  ____________________________________________   PHYSICAL EXAM:  VITAL SIGNS: ED Triage Vitals  Enc Vitals Group     BP 08/19/17 0802 (!) 170/68     Pulse Rate 08/19/17 0802 (!) 110     Resp 08/19/17 0802 20      Temp 08/19/17 0802 99.4 F (37.4 C)     Temp Source 08/19/17 0802 Oral     SpO2 08/19/17 0802 94 %     Weight 08/19/17 0803 248 lb (112.5 kg)     Height 08/19/17 0803 5\' 7"  (1.702 m)     Head Circumference --      Peak Flow --      Pain Score 08/19/17 0802 7     Pain Loc --      Pain Edu? --      Excl. in [place]? --    Constitutional: Alert and oriented. Well appearing and in no acute distress. Eyes: Conjunctivae are normal. PERRL. EOMI. Head: Atraumatic. Nose: No congestion/rhinnorhea. Mouth/Throat: Mucous membranes are moist.  Oropharynx non-erythematous. Neck: No stridor.  No cervical spine tenderness to palpation. Cardiovascular: Normal rate, regular rhythm. Grossly normal heart sounds.  Good peripheral circulation.  Elevated blood pressure. Respiratory: Normal respiratory effort.  No retractions. Lungs CTAB. }Musculoskeletal: No obvious deformity to the right elbow right wrist.  Patient has decreased range of motion extension of the right forearm and decreased range of motion of flexion extension of the right wrist.. Neurologic:  Normal speech and language. No gross focal neurologic deficits are appreciated. No gait instability. Skin:  Skin is warm, dry and intact. No rash noted.  Abrasion posterior right elbow Psychiatric: Mood and affect are normal. Speech and behavior are normal.  ____________________________________________   LABS (all labs ordered are listed, but only abnormal results are displayed)  Labs Reviewed - No data to display ____________________________________________  EKG   ____________________________________________  RADIOLOGY  ED MD interpretation: No acute findings x-ray of the right forearm and wrist.  Official radiology report(s): Dg Elbow Complete Right  Result Date: 08/19/2017 CLINICAL DATA:  [place] last evening and injured elbow. EXAM: RIGHT ELBOW - COMPLETE 3+ VIEW COMPARISON:  None. FINDINGS: Mild elbow joint degenerative changes but no acute  fracture or osteochondral lesion. No joint effusion. IMPRESSION: Mild degenerative changes but no fracture or joint effusion. Electronically Signed   By: Marijo Sanes M.D.   On: 08/19/2017 09:04   Dg Wrist Complete Right  Result Date: 08/19/2017 CLINICAL DATA:  [place] last evening and injured right wrist. EXAM: RIGHT WRIST - COMPLETE 3+ VIEW COMPARISON:  None. FINDINGS: Mild to moderate degenerative changes but no fracture. Small rounded well corticated densities on the lateral film dorsally could be related to old triquetrum avulsion fracture. IMPRESSION: Mild-to-moderate degenerative changes but no acute fracture. Electronically Signed   By: Marijo Sanes M.D.   On: 08/19/2017 09:06    ____________________________________________   PROCEDURES  Procedure(s) performed: None  Procedures  Critical Care performed: No  ____________________________________________   INITIAL IMPRESSION / ASSESSMENT AND PLAN / ED COURSE  As part of my medical decision making, I reviewed the following data within the electronic MEDICAL RECORD NUMBER    Right elbow and wrist pain secondary to fall.  Discussed x-ray findings with patient.  Patient given discharge care instruction.  Patient placed in a wrist splint and arm sling.  Advised take medication as directed and follow-up PCP as needed.      ____________________________________________   FINAL CLINICAL IMPRESSION(S) / ED DIAGNOSES  Final diagnoses:  Contusion of right elbow and forearm, initial encounter  Sprain of right wrist, initial encounter  Fall, initial encounter     ED Discharge Orders        Ordered    oxyCODONE-acetaminophen (PERCOCET) 7.5-325 MG tablet  Every 6 hours PRN     08/19/17 0918    ibuprofen (ADVIL,MOTRIN) 600 MG tablet  Every 8 hours PRN     08/19/17 4163       Note:  This document was prepared using Dragon voice recognition software and may include unintentional dictation errors.    Sable Feil,  PA-C 08/19/17 8453    Earleen Newport, MD 08/19/17 [phone]

## 2017-08-19 NOTE — Discharge Instructions (Signed)
Sling and splint for 3-5 days as needed.

## 2017-08-27 ENCOUNTER — Telehealth: Payer: Self-pay | Admitting: Pharmacist

## 2017-08-27 NOTE — Telephone Encounter (Signed)
08/27/2017 1:59:36 PM - Apidra Solostar refill  08/27/17 Faxed [employer] refill request for Apidra Solostar Inject 35 units under the skin three times a day with meals, #6.Michael Mack

## 2017-09-01 ENCOUNTER — Telehealth: Payer: Self-pay | Admitting: Pharmacist

## 2017-09-01 NOTE — Telephone Encounter (Signed)
09/01/2017 2:23:03 PM - Lantus Solostar pen refill  09/01/17 Taking [employer] refill request to [facility] for Dr. Mable Fill to sign for Lantus Solostar Inject 50 units under the skin daily at bedtime #3.Michael Mack

## 2017-09-17 ENCOUNTER — Telehealth: Payer: Self-pay | Admitting: Pharmacist

## 2017-09-17 NOTE — Telephone Encounter (Signed)
09/17/2017 11:22:40 AM - Lantus Solostar refill  09/17/17 Faxed [employer] a refill request for Lantus Solostar Inject 50 units under the skin daily at bedtime #3.Michael Mack

## 2017-11-03 ENCOUNTER — Other Ambulatory Visit: Payer: Self-pay

## 2017-11-03 DIAGNOSIS — E119 Type 2 diabetes mellitus without complications: Secondary | ICD-10-CM

## 2017-11-03 DIAGNOSIS — Z794 Long term (current) use of insulin: Secondary | ICD-10-CM

## 2017-11-03 DIAGNOSIS — I10 Essential (primary) hypertension: Secondary | ICD-10-CM

## 2017-11-04 LAB — LIPID PANEL
CHOL/HDL RATIO: 2.9 ratio (ref 0.0–5.0)
CHOLESTEROL TOTAL: 130 mg/dL (ref 100–199)
HDL: 45 mg/dL (ref >39)
LDL Calculated: 62 mg/dL (ref 0–99)
TRIGLYCERIDES: 113 mg/dL (ref 0–149)
VLDL Cholesterol Cal: 23 mg/dL (ref 5–40)

## 2017-11-04 LAB — COMPREHENSIVE METABOLIC PANEL
A/G RATIO: 1.3 (ref 1.2–2.2)
ALBUMIN: 3.9 g/dL (ref 3.5–5.5)
ALT: 15 IU/L (ref 0–44)
AST: 18 IU/L (ref 0–40)
Alkaline Phosphatase: 69 IU/L (ref 39–117)
BUN / CREAT RATIO: 25 — AB (ref 9–20)
BUN: 21 mg/dL (ref 6–24)
Bilirubin Total: 0.5 mg/dL (ref 0.0–1.2)
CALCIUM: 9.1 mg/dL (ref 8.7–10.2)
CHLORIDE: 102 mmol/L (ref 96–106)
CO2: 23 mmol/L (ref 20–29)
Creatinine, Ser: 0.85 mg/dL (ref 0.76–1.27)
GFR, EST AFRICAN AMERICAN: 113 mL/min/{1.73_m2} (ref >59)
GFR, EST NON AFRICAN AMERICAN: 97 mL/min/{1.73_m2} (ref >59)
GLOBULIN, TOTAL: 3.1 g/dL (ref 1.5–4.5)
Glucose: 126 mg/dL — ABNORMAL HIGH (ref 65–99)
POTASSIUM: 3.9 mmol/L (ref 3.5–5.2)
SODIUM: 141 mmol/L (ref 134–144)
Total Protein: 7 g/dL (ref 6.0–8.5)

## 2017-11-04 LAB — HEMOGLOBIN A1C
ESTIMATED AVERAGE GLUCOSE: 154 mg/dL
Hgb A1c MFr Bld: 7 % — ABNORMAL HIGH (ref 4.8–5.6)

## 2017-11-10 ENCOUNTER — Ambulatory Visit: Payer: Self-pay | Admitting: Internal Medicine

## 2017-11-10 ENCOUNTER — Encounter: Payer: Self-pay | Admitting: Internal Medicine

## 2017-11-10 VITALS — BP 143/80 | HR 84 | Temp 96.7°F | Wt 254.0 lb

## 2017-11-10 DIAGNOSIS — K7290 Hepatic failure, unspecified without coma: Secondary | ICD-10-CM

## 2017-11-10 DIAGNOSIS — Z794 Long term (current) use of insulin: Secondary | ICD-10-CM

## 2017-11-10 DIAGNOSIS — E119 Type 2 diabetes mellitus without complications: Secondary | ICD-10-CM

## 2017-11-10 DIAGNOSIS — K7682 Hepatic encephalopathy: Secondary | ICD-10-CM

## 2017-11-10 NOTE — Progress Notes (Signed)
   Subjective:    Patient ID: Michael Mack, male    DOB: September 30, 1960, 57 y.o.   MRN: [id number]  HPI   Pt is here for a f/u. He has started a new medication for hepatic encephalopathy. He is having a colonoscopy on June 6th.  Pt went to the ED a couple of months ago because he fell on his wrist. He said it isn't broken, but he still has some pain.     Allergies as of 11/10/2017   No Known Allergies     Medication List        Accurate as of 11/10/17 11:07 AM. Always use your most recent med list.          acetaminophen 500 MG tablet Commonly known as:  TYLENOL Take 500 mg by mouth as needed.   ALPRAZolam 1 MG tablet Commonly known as:  XANAX Take 1 mg by mouth 3 (three) times daily.   amitriptyline 50 MG tablet Commonly known as:  ELAVIL Take 50 mg by mouth at bedtime. Uses for sleep   hydrochlorothiazide 25 MG tablet Commonly known as:  HYDRODIURIL Take 1 tablet (25 mg total) by mouth daily.   ibuprofen 600 MG tablet Commonly known as:  ADVIL,MOTRIN Take 1 tablet (600 mg total) by mouth every 8 (eight) hours as needed.   Insulin Glargine 100 UNIT/ML Solostar Pen Commonly known as:  LANTUS Inject 50 Units into the skin at bedtime.   insulin glulisine 100 UNIT/ML injection Commonly known as:  APIDRA Inject 0.35 mLs (35 Units total) into the skin 3 (three) times daily with meals.   lactulose 10 GM/15ML solution Commonly known as:  CHRONULAC Take by mouth 3 (three) times daily.   lactulose 10 GM/15ML solution Commonly known as:  CHRONULAC Take by mouth.   lisinopril 40 MG tablet Commonly known as:  PRINIVIL,ZESTRIL Take 1 tablet (40 mg total) by mouth daily.   methadone 10 MG tablet Commonly known as:  DOLOPHINE Take 10 mg by mouth daily.   oxyCODONE-acetaminophen 7.5-325 MG tablet Commonly known as:  PERCOCET Take 1 tablet by mouth every 6 (six) hours as needed for severe pain.   ranitidine 150 MG tablet Commonly known as:  ZANTAC Take 1 tablet  (150 mg total) by mouth daily.   XIFAXAN 550 MG Tabs tablet Generic drug:  rifaximin Take by mouth.      Patient Active Problem List   Diagnosis Date Noted  . Thrombocytopenia ([place]) 09/11/2015  . Cirrhosis ([place]) 09/11/2015  . Hepatitis C 04/18/2015  . Diabetes ([place]) 01/01/2015  . GERD (gastroesophageal reflux disease) 01/01/2015  . Hypertension 01/01/2015  . Fatty liver 01/01/2015     Review of Systems     Objective:   Physical Exam  Constitutional: He is oriented to person, place, and time.  Cardiovascular: Normal rate, regular rhythm and normal heart sounds.  Pulmonary/Chest: Effort normal and breath sounds normal.  Neurological: He is alert and oriented to person, place, and time.    BP (!) 143/80 (BP Location: Left Arm, Patient Position: Sitting, Cuff Size: Large)   Pulse 84   Temp (!) 96.7 F (35.9 C) (Oral)   Wt 254 lb (115.2 kg)   BMI 39.78 kg/m         Assessment & Plan:   Blood tests show no new concerns. A1c level has improved.  Rtc in 3 months with labs.

## 2017-11-18 ENCOUNTER — Telehealth: Payer: Self-pay | Admitting: Pharmacy Technician

## 2017-11-18 NOTE — Telephone Encounter (Signed)
Patient failed to provide last 30 days of pay stubs from [place], and last 30 days of checking account statements.  No additional medication assistance will be provided by [facility] without the required proof of income documentation.  Patient notified by letter.  [facility]

## 2017-11-19 ENCOUNTER — Telehealth: Payer: Self-pay

## 2017-11-19 NOTE — Telephone Encounter (Signed)
-----   Message from Staci Acosta, NP sent at 11/18/2017  6:12 PM EDT ----- Labs stable. A1c much improved from 8.2 down to 7. Keep up the good work!

## 2017-11-19 NOTE — Telephone Encounter (Signed)
Lm for pt to call back re: lab results 

## 2017-12-01 ENCOUNTER — Telehealth: Payer: Self-pay | Admitting: Pharmacist

## 2017-12-01 NOTE — Telephone Encounter (Signed)
12/01/2017 9:59:03 AM - unable to order insulins  12/01/17 Time for patient to re enroll with [employer] for [place] has not provided 1 month of spouse income-denial letter mailed to patient 11/18/17.AJ   11/19/2017 9:50:20 AM - Denial letter mailed. failed to provide poi  11/19/2017-Denial letter mailed. requesting 30 days of stubbs from wife. sdg

## 2018-01-10 ENCOUNTER — Other Ambulatory Visit: Payer: Self-pay | Admitting: Internal Medicine

## 2018-01-11 ENCOUNTER — Telehealth: Payer: Self-pay | Admitting: Pharmacist

## 2018-01-11 ENCOUNTER — Other Ambulatory Visit: Payer: Self-pay | Admitting: Urology

## 2018-01-11 MED ORDER — INSULIN LISPRO 100 UNIT/ML ~~LOC~~ SOLN: SUBCUTANEOUS | 11 refills | Status: DC

## 2018-01-11 NOTE — Progress Notes (Unsigned)
Script sent for the Humalog to [facility].

## 2018-01-11 NOTE — Telephone Encounter (Signed)
01/11/2018 9:03:00 AM - Patient in office 01/10/18  01/11/18 Patient called [name] yesterday 01/10/18 about his insulin stated only has 1 of each-Lantus Solostar & Apidra Solostar left. We had not been able to order due to hold up on wife's income-that information has now been supplied. Reviewing patient eligibility with [employer]-expiring 01/11/18. I explained to patient he would need to sign application for renewal and provider would need to sign, also per Christan the script had expired. She is sending a request through [facility], I have printed Renewal applications for Lantus Solostar Inject 50 units daily at bedtime # 3 & Apidra Solostar Inject 35 units into the skin 3 times a day with meals # 6. Patient will come and sign forms. Patient did come to sign the application, also he asked if he could take the forms to [facility] for Dr. Mable Fill to sign-I have given patient an envelope for him to take to [facility] with Lorrie's name on it for Dr. Mable Fill to sign on Wednesday & I will pick up on Thursday-this will expediate the process. I am also sending Lorrie a message on this.Michael Mack

## 2018-01-18 ENCOUNTER — Telehealth: Payer: Self-pay | Admitting: Pharmacist

## 2018-01-18 NOTE — Telephone Encounter (Signed)
01/18/2018 12:00:32 PM - Faxed [employer] renewal on Apidra & Lantus  01/18/18 I have faxed [employer] a renewal application to process for [name] 35 units three times a day with meals Total 105 units per day #6 & also for Lantus Solostar Inject 50 units a day at bedtime #3.Delos Haring

## 2018-01-20 ENCOUNTER — Other Ambulatory Visit: Payer: Self-pay | Admitting: Adult Health Nurse Practitioner

## 2018-01-26 ENCOUNTER — Other Ambulatory Visit: Payer: Self-pay | Admitting: Internal Medicine

## 2018-01-26 DIAGNOSIS — Z794 Long term (current) use of insulin: Secondary | ICD-10-CM

## 2018-01-26 DIAGNOSIS — E119 Type 2 diabetes mellitus without complications: Secondary | ICD-10-CM

## 2018-02-02 ENCOUNTER — Ambulatory Visit: Payer: Self-pay | Admitting: Internal Medicine

## 2018-02-08 ENCOUNTER — Ambulatory Visit: Payer: Self-pay | Admitting: Pharmacy Technician

## 2018-02-08 NOTE — Progress Notes (Signed)
Patient scheduled for appointment to complete paperwork for  [facility].  Patient did not show for the appointment on February 08, 2018 at 2:00p.m.  Patient did not reschedule financial assistance appointment.    [facility]

## 2018-02-09 ENCOUNTER — Other Ambulatory Visit: Payer: Self-pay

## 2018-02-09 DIAGNOSIS — Z794 Long term (current) use of insulin: Secondary | ICD-10-CM

## 2018-02-09 DIAGNOSIS — E119 Type 2 diabetes mellitus without complications: Secondary | ICD-10-CM

## 2018-02-10 LAB — AMMONIA: Ammonia: 59 ug/dL (ref 27–102)

## 2018-02-10 LAB — COMPREHENSIVE METABOLIC PANEL
ALBUMIN: 4.2 g/dL (ref 3.5–5.5)
ALT: 21 IU/L (ref 0–44)
AST: 26 IU/L (ref 0–40)
Albumin/Globulin Ratio: 1.2 (ref 1.2–2.2)
Alkaline Phosphatase: 80 IU/L (ref 39–117)
BILIRUBIN TOTAL: 0.9 mg/dL (ref 0.0–1.2)
BUN / CREAT RATIO: 17 (ref 9–20)
BUN: 15 mg/dL (ref 6–24)
CO2: 23 mmol/L (ref 20–29)
CREATININE: 0.88 mg/dL (ref 0.76–1.27)
Calcium: 9.5 mg/dL (ref 8.7–10.2)
Chloride: 103 mmol/L (ref 96–106)
GFR calc non Af Amer: 96 mL/min/{1.73_m2} (ref >59)
GFR, EST AFRICAN AMERICAN: 111 mL/min/{1.73_m2} (ref >59)
GLOBULIN, TOTAL: 3.5 g/dL (ref 1.5–4.5)
Glucose: 167 mg/dL — ABNORMAL HIGH (ref 65–99)
Potassium: 3.6 mmol/L (ref 3.5–5.2)
SODIUM: 140 mmol/L (ref 134–144)
TOTAL PROTEIN: 7.7 g/dL (ref 6.0–8.5)

## 2018-02-10 LAB — HEMOGLOBIN A1C
Est. average glucose Bld gHb Est-mCnc: 174 mg/dL
Hgb A1c MFr Bld: 7.7 % — ABNORMAL HIGH (ref 4.8–5.6)

## 2018-02-10 LAB — CBC
HEMATOCRIT: 45.8 % (ref 37.5–51.0)
HEMOGLOBIN: 16 g/dL (ref 13.0–17.7)
MCH: 30.2 pg (ref 26.6–33.0)
MCHC: 34.9 g/dL (ref 31.5–35.7)
MCV: 86 fL (ref 79–97)
Platelets: 116 10*3/uL — ABNORMAL LOW (ref 150–450)
RBC: 5.3 x10E6/uL (ref 4.14–5.80)
RDW: 14.7 % (ref 12.3–15.4)
WBC: 5.7 10*3/uL (ref 3.4–10.8)

## 2018-02-16 ENCOUNTER — Ambulatory Visit: Payer: Self-pay | Admitting: Internal Medicine

## 2018-02-16 ENCOUNTER — Encounter: Payer: Self-pay | Admitting: Internal Medicine

## 2018-02-16 VITALS — BP 121/68 | HR 97 | Temp 98.0°F | Ht 66.5 in | Wt 256.3 lb

## 2018-02-16 DIAGNOSIS — K7460 Unspecified cirrhosis of liver: Secondary | ICD-10-CM

## 2018-02-16 DIAGNOSIS — Z794 Long term (current) use of insulin: Secondary | ICD-10-CM

## 2018-02-16 DIAGNOSIS — E119 Type 2 diabetes mellitus without complications: Secondary | ICD-10-CM

## 2018-02-16 NOTE — Progress Notes (Signed)
   Subjective:    Patient ID: Michael Mack, male    DOB: 12-Jul-1960, 57 y.o.   MRN: [id number]  HPI  [facility] completed colonoscopy (11/25/17) he does not have to have another for 10 years.   Checks FSBS 3 times a day with the highest reading in the 200's and lowest is 70's. Recent Hemoglobin A1C lab was 7.7.    Review of Systems   Patient Active Problem List   Diagnosis Date Noted  . Thrombocytopenia ([place]) 09/11/2015  . Cirrhosis ([place]) 09/11/2015  . Hepatitis C 04/18/2015  . Diabetes ([place]) 01/01/2015  . GERD (gastroesophageal reflux disease) 01/01/2015  . Hypertension 01/01/2015  . Fatty liver 01/01/2015   Allergies as of 02/16/2018   No Known Allergies     Medication List        Accurate as of 02/16/18 11:07 AM. Always use your most recent med list.          acetaminophen 500 MG tablet Commonly known as:  TYLENOL Take 500 mg by mouth as needed.   ALPRAZolam 1 MG tablet Commonly known as:  XANAX Take 1 mg by mouth 3 (three) times daily.   amitriptyline 50 MG tablet Commonly known as:  ELAVIL Take 50 mg by mouth at bedtime. Uses for sleep   hydrochlorothiazide 25 MG tablet Commonly known as:  HYDRODIURIL TAKE ONE TABLET BY MOUTH EVERY DAY   ibuprofen 600 MG tablet Commonly known as:  ADVIL,MOTRIN Take 1 tablet (600 mg total) by mouth every 8 (eight) hours as needed.   insulin glulisine 100 UNIT/ML injection Commonly known as:  APIDRA Inject 0.35 mLs (35 Units total) into the skin 3 (three) times daily with meals.   insulin lispro 100 UNIT/ML injection Commonly known as:  HUMALOG Inject 35 units tid with meals   lactulose 10 GM/15ML solution Commonly known as:  CHRONULAC Take by mouth 3 (three) times daily.   lactulose 10 GM/15ML solution Commonly known as:  CHRONULAC Take by mouth.   LANTUS SOLOSTAR 100 UNIT/ML Solostar Pen Generic drug:  Insulin Glargine INJECT 50 UNITS UNDER THE SKIN AT BEDTIME   lisinopril 40 MG tablet Commonly known  as:  PRINIVIL,ZESTRIL TAKE ONE TABLET BY MOUTH EVERY DAY   methadone 10 MG tablet Commonly known as:  DOLOPHINE Take 10 mg by mouth daily.   oxyCODONE-acetaminophen 7.5-325 MG tablet Commonly known as:  PERCOCET Take 1 tablet by mouth every 6 (six) hours as needed for severe pain.   ranitidine 150 MG tablet Commonly known as:  ZANTAC TAKE ONE TABLET BY MOUTH EVERY DAY   XIFAXAN 550 MG Tabs tablet Generic drug:  rifaximin Take by mouth.           Objective:   Physical Exam  Constitutional: He is oriented to person, place, and time.  Cardiovascular: Normal rate, regular rhythm and normal heart sounds.  Pulmonary/Chest: Effort normal and breath sounds normal.  Neurological: He is alert and oriented to person, place, and time.    BP 121/68   Pulse 97   Temp 98 F (36.7 C) (Oral)   Ht 5' 6.5" (1.689 m)   Wt 256 lb 4.8 oz (116.3 kg)   BMI 40.75 kg/m      Assessment & Plan:   No changes.   Return in 3 months with labs ( a week prior.

## 2018-02-23 ENCOUNTER — Encounter: Payer: Self-pay | Admitting: Pharmacy Technician

## 2018-02-23 ENCOUNTER — Ambulatory Visit: Payer: Self-pay | Admitting: Pharmacy Technician

## 2018-02-23 DIAGNOSIS — Z79899 Other long term (current) drug therapy: Secondary | ICD-10-CM

## 2018-02-23 NOTE — Progress Notes (Signed)
Met with patient completed financial assistance application for [place] due to recent hospital visit.  Patient agreed to be responsible for gathering financial information and forwarding to appropriate department in [facility].    Pharmacy Tech to transfer Lactulose Prescription from [employer].  [facility]

## 2018-03-02 ENCOUNTER — Ambulatory Visit: Payer: Self-pay | Admitting: Pharmacy Technician

## 2018-04-01 ENCOUNTER — Other Ambulatory Visit: Payer: Self-pay | Admitting: Internal Medicine

## 2018-05-11 ENCOUNTER — Other Ambulatory Visit: Payer: Self-pay

## 2018-05-12 ENCOUNTER — Other Ambulatory Visit: Payer: Self-pay

## 2018-05-12 DIAGNOSIS — D696 Thrombocytopenia, unspecified: Secondary | ICD-10-CM

## 2018-05-12 DIAGNOSIS — E119 Type 2 diabetes mellitus without complications: Secondary | ICD-10-CM

## 2018-05-12 DIAGNOSIS — Z794 Long term (current) use of insulin: Secondary | ICD-10-CM

## 2018-05-13 LAB — URINALYSIS
BILIRUBIN UA: NEGATIVE
Ketones, UA: NEGATIVE
Leukocytes, UA: NEGATIVE
Nitrite, UA: NEGATIVE
PH UA: 6.5 (ref 5.0–7.5)
UUROB: 0.2 mg/dL (ref 0.2–1.0)

## 2018-05-13 LAB — MICROALBUMIN / CREATININE URINE RATIO
CREATININE, UR: 78.4 mg/dL
MICROALBUM., U, RANDOM: 221.5 ug/mL
Microalb/Creat Ratio: 282.5 mg/g creat — ABNORMAL HIGH (ref 0.0–30.0)

## 2018-05-13 LAB — CBC WITH DIFFERENTIAL/PLATELET
BASOS: 1 %
Basophils Absolute: 0 10*3/uL (ref 0.0–0.2)
EOS (ABSOLUTE): 0.1 10*3/uL (ref 0.0–0.4)
Eos: 3 %
HEMATOCRIT: 40.8 % (ref 37.5–51.0)
Hemoglobin: 14 g/dL (ref 13.0–17.7)
Immature Grans (Abs): 0 10*3/uL (ref 0.0–0.1)
Immature Granulocytes: 1 %
Lymphocytes Absolute: 1.1 10*3/uL (ref 0.7–3.1)
Lymphs: 25 %
MCH: 31.1 pg (ref 26.6–33.0)
MCHC: 34.3 g/dL (ref 31.5–35.7)
MCV: 91 fL (ref 79–97)
MONOS ABS: 0.4 10*3/uL (ref 0.1–0.9)
Monocytes: 9 %
Neutrophils Absolute: 2.6 10*3/uL (ref 1.4–7.0)
Neutrophils: 61 %
Platelets: 84 10*3/uL — CL (ref 150–450)
RBC: 4.5 x10E6/uL (ref 4.14–5.80)
RDW: 13.4 % (ref 12.3–15.4)
WBC: 4.3 10*3/uL (ref 3.4–10.8)

## 2018-05-13 LAB — PROTIME-INR
INR: 1.2 (ref 0.8–1.2)
Prothrombin Time: 12.2 s — ABNORMAL HIGH (ref 9.1–12.0)

## 2018-05-13 LAB — COMPREHENSIVE METABOLIC PANEL
ALK PHOS: 79 IU/L (ref 39–117)
ALT: 19 IU/L (ref 0–44)
AST: 24 IU/L (ref 0–40)
Albumin/Globulin Ratio: 1.4 (ref 1.2–2.2)
Albumin: 4.2 g/dL (ref 3.5–5.5)
BUN/Creatinine Ratio: 18 (ref 9–20)
BUN: 14 mg/dL (ref 6–24)
Bilirubin Total: 0.5 mg/dL (ref 0.0–1.2)
CO2: 25 mmol/L (ref 20–29)
Calcium: 9.5 mg/dL (ref 8.7–10.2)
Chloride: 101 mmol/L (ref 96–106)
Creatinine, Ser: 0.76 mg/dL (ref 0.76–1.27)
GFR calc Af Amer: 117 mL/min/{1.73_m2} (ref >59)
GFR, EST NON AFRICAN AMERICAN: 101 mL/min/{1.73_m2} (ref >59)
GLOBULIN, TOTAL: 3.1 g/dL (ref 1.5–4.5)
Glucose: 198 mg/dL — ABNORMAL HIGH (ref 65–99)
Potassium: 4.1 mmol/L (ref 3.5–5.2)
SODIUM: 140 mmol/L (ref 134–144)
Total Protein: 7.3 g/dL (ref 6.0–8.5)

## 2018-05-13 LAB — GAMMA GT: GGT: 55 IU/L (ref 0–65)

## 2018-05-13 LAB — HEMOGLOBIN A1C
Est. average glucose Bld gHb Est-mCnc: 174 mg/dL
HEMOGLOBIN A1C: 7.7 % — AB (ref 4.8–5.6)

## 2018-05-18 ENCOUNTER — Ambulatory Visit: Payer: Self-pay | Admitting: Internal Medicine

## 2018-05-18 ENCOUNTER — Encounter: Payer: Self-pay | Admitting: Internal Medicine

## 2018-05-18 VITALS — BP 116/74 | HR 99 | Temp 98.4°F | Wt 265.5 lb

## 2018-05-18 DIAGNOSIS — I10 Essential (primary) hypertension: Secondary | ICD-10-CM

## 2018-05-18 DIAGNOSIS — K219 Gastro-esophageal reflux disease without esophagitis: Secondary | ICD-10-CM

## 2018-05-18 DIAGNOSIS — E119 Type 2 diabetes mellitus without complications: Secondary | ICD-10-CM

## 2018-05-18 DIAGNOSIS — K7460 Unspecified cirrhosis of liver: Secondary | ICD-10-CM

## 2018-05-18 DIAGNOSIS — Z794 Long term (current) use of insulin: Secondary | ICD-10-CM

## 2018-05-18 MED ORDER — HYDROCHLOROTHIAZIDE 25 MG PO TABS: 25.0000 mg | ORAL_TABLET | Freq: Every day | ORAL | 3 refills | Status: DC

## 2018-05-18 MED ORDER — LISINOPRIL 40 MG PO TABS: 40.0000 mg | ORAL_TABLET | Freq: Every day | ORAL | 3 refills | Status: DC

## 2018-05-18 MED ORDER — RANITIDINE HCL 150 MG PO TABS: 150.0000 mg | ORAL_TABLET | Freq: Every day | ORAL | 3 refills | Status: DC

## 2018-05-18 MED ORDER — INSULIN GLARGINE 100 UNIT/ML SOLOSTAR PEN: PEN_INJECTOR | SUBCUTANEOUS | 2 refills | Status: DC

## 2018-05-18 MED ORDER — INSULIN GLULISINE 100 UNIT/ML IJ SOLN: 35.0000 [IU] | Freq: Three times a day (TID) | INTRAMUSCULAR | 27 refills | Status: DC

## 2018-05-18 NOTE — Progress Notes (Signed)
Subjective:    Patient ID: Michael Mack, male    DOB: 05/28/1961, 57 y.o.   MRN: [id number]  HPI   Pt is a 56 yr. old male who presents for follow up with hypertension, diabetes, and end stage cirrhosis. Pt states that he has been following up with [facility] provider for cirrhosis of liver.    Chief Complaint  Patient presents with  . Follow-up    Hypertension and end stage cirrhosis    Review of Systems  Patient Active Problem List   Diagnosis Date Noted  . Thrombocytopenia ([place]) 09/11/2015  . Cirrhosis ([place]) 09/11/2015  . Hepatitis C 04/18/2015  . Diabetes ([place]) 01/01/2015  . GERD (gastroesophageal reflux disease) 01/01/2015  . Hypertension 01/01/2015  . Fatty liver 01/01/2015   Allergies as of 05/18/2018   No Known Allergies     Medication List        Accurate as of 05/18/18 11:24 AM. Always use your most recent med list.          acetaminophen 500 MG tablet Commonly known as:  TYLENOL Take 500 mg by mouth as needed.   ALPRAZolam 1 MG tablet Commonly known as:  XANAX Take 1 mg by mouth 3 (three) times daily.   amitriptyline 50 MG tablet Commonly known as:  ELAVIL Take 50 mg by mouth at bedtime. Uses for sleep   hydrochlorothiazide 25 MG tablet Commonly known as:  HYDRODIURIL TAKE ONE TABLET BY MOUTH EVERY DAY   ibuprofen 600 MG tablet Commonly known as:  ADVIL,MOTRIN Take 1 tablet (600 mg total) by mouth every 8 (eight) hours as needed.   insulin glulisine 100 UNIT/ML injection Commonly known as:  APIDRA Inject 0.35 mLs (35 Units total) into the skin 3 (three) times daily with meals.   insulin lispro 100 UNIT/ML injection Commonly known as:  HUMALOG Inject 35 units tid with meals   lactulose 10 GM/15ML solution Commonly known as:  CHRONULAC Take by mouth 3 (three) times daily.   lactulose 10 GM/15ML solution Commonly known as:  CHRONULAC Take by mouth.   lactulose 10 GM/15ML solution Commonly known as:  CHRONULAC TAKE 30MLS BY  MOUTH 3 TIMES A DAY   LANTUS SOLOSTAR 100 UNIT/ML Solostar Pen Generic drug:  Insulin Glargine INJECT 50 UNITS UNDER THE SKIN AT BEDTIME   lisinopril 40 MG tablet Commonly known as:  PRINIVIL,ZESTRIL TAKE ONE TABLET BY MOUTH EVERY DAY   methadone 10 MG tablet Commonly known as:  DOLOPHINE Take 10 mg by mouth daily.   oxycodone 30 MG immediate release tablet Commonly known as:  ROXICODONE Take 30 mg by mouth 2 (two) times daily. This is prescribed by his pain management team.   ranitidine 150 MG tablet Commonly known as:  ZANTAC TAKE ONE TABLET BY MOUTH EVERY DAY   XIFAXAN 550 MG Tabs tablet Generic drug:  rifaximin Take by mouth.          Objective:   Physical Exam  Constitutional: He is oriented to person, place, and time.  Cardiovascular: Normal rate and regular rhythm.  Neurological: He is alert and oriented to person, place, and time.    BP 116/74   Pulse 99   Temp 98.4 F (36.9 C)   Wt 265 lb 8 oz (120.4 kg)   BMI 42.21 kg/m        Assessment & Plan:   1. Essential hypertension Controlled.   Refill Today: - hydrochlorothiazide (HYDRODIURIL) 25 MG tablet; Take 1 tablet (25 mg total) by  mouth daily.  Dispense: 90 tablet; Refill: 3 - lisinopril (PRINIVIL,ZESTRIL) 40 MG tablet; Take 1 tablet (40 mg total) by mouth daily.  Dispense: 90 tablet; Refill: 3  Check in 6 Months:  - CBC; Future - Lipid panel; Future - Comprehensive metabolic panel; Future  2. Type 2 diabetes mellitus without complication, with long-term current use of insulin (HCC) Last A1C was 7.7 (05/12/18). Pt checks FSBS 3-4 times a day an adjusts his insulin as needed.  Refill Today:  - Insulin Glargine (LANTUS SOLOSTAR) 100 UNIT/ML Solostar Pen; INJECT 50 UNITS UNDER THE SKIN AT BEDTIME  Dispense: 30 mL; Refill: 2 - insulin glulisine (APIDRA) 100 UNIT/ML injection; Inject 0.35 mLs (35 Units total) into the skin 3 (three) times daily with meals.  Dispense: 10 mL; Refill: 27  Check in  6 Months: - Hemoglobin A1c; Future - PSA; Future - Urinalysis; Future  3. Cirrhosis of liver without ascites, unspecified hepatic cirrhosis type ([place]) Pt is following up with provider in [facility] regularly.   4. Gastroesophageal reflux disease, esophagitis presence not specified Manageable with medication.   Refill Today:  - ranitidine (ZANTAC) 150 MG tablet; Take 1 tablet (150 mg total) by mouth daily.  Dispense: 90 tablet; Refill: 3   Return in 6 months with labs a week prior.

## 2018-05-25 ENCOUNTER — Telehealth: Payer: Self-pay | Admitting: Pharmacist

## 2018-05-25 NOTE — Telephone Encounter (Signed)
05/25/2018 3:46:48 PM - Lantus Solostar & Apidra Solostar  05/25/18 Taking [employer] refill request to [facility] for Lantus Solostar Inject 50 units daily at bedtime (3) & also Apidra Solostar Inject 35 units three times a day with meals (6).Michael Mack

## 2018-06-03 ENCOUNTER — Telehealth: Payer: Self-pay | Admitting: Pharmacist

## 2018-06-03 NOTE — Telephone Encounter (Signed)
06/03/2018 10:52:28 AM - Lantus Solostar & Apidra Solostar refills  06/03/18 Fax refill request to [facility] for Lantus Solostar Inject 50 units daily at bedtime #3 & Apidra Solostar Inject 35 units three times a day with meals//Max daily 105 units #6.Delos Haring

## 2018-06-30 ENCOUNTER — Ambulatory Visit: Payer: Self-pay | Admitting: Ophthalmology

## 2018-06-30 ENCOUNTER — Other Ambulatory Visit: Payer: Self-pay | Admitting: Ophthalmology

## 2018-06-30 LAB — HM DIABETES EYE EXAM

## 2018-07-06 ENCOUNTER — Ambulatory Visit
Admission: RE | Admit: 2018-07-06 | Discharge: 2018-07-06 | Disposition: A | Discharge status: Home / Self Care | Payer: Self-pay | Attending: Internal Medicine | Admitting: Internal Medicine

## 2018-07-06 ENCOUNTER — Ambulatory Visit
Admission: RE | Admit: 2018-07-06 | Discharge: 2018-07-06 | Disposition: A | Discharge status: Home / Self Care | Payer: Self-pay | Source: Ambulatory Visit | Attending: Internal Medicine | Admitting: Internal Medicine

## 2018-07-06 ENCOUNTER — Encounter: Payer: Self-pay | Admitting: Internal Medicine

## 2018-07-06 ENCOUNTER — Ambulatory Visit: Payer: Self-pay | Admitting: Internal Medicine

## 2018-07-06 VITALS — BP 117/67 | HR 104 | Temp 98.3°F | Ht 66.0 in | Wt 277.5 lb

## 2018-07-06 DIAGNOSIS — R0989 Other specified symptoms and signs involving the circulatory and respiratory systems: Secondary | ICD-10-CM

## 2018-07-06 DIAGNOSIS — K7460 Unspecified cirrhosis of liver: Secondary | ICD-10-CM

## 2018-07-06 MED ORDER — GUAIFENESIN 100 MG/5ML PO SYRP: 100.0000 mg | ORAL_SOLUTION | Freq: Three times a day (TID) | ORAL | 0 refills | Status: DC | PRN

## 2018-07-06 MED ORDER — LORATADINE 10 MG PO TABS: 10.0000 mg | ORAL_TABLET | Freq: Every day | ORAL | 0 refills | Status: DC

## 2018-07-06 NOTE — Progress Notes (Signed)
Subjective:    Patient ID: Michael Mack, male    DOB: 1961-01-01, 58 y.o.   MRN: [id number]  HPI   Pt reports that he has been receiving dental care from [facility]. He recently had 5 teeth removed.   Saw the [facility] doctor right after Christmas. He reported he had lab work done as there was concern about either his WBC. He had labs done and he states [facility] doctor didn't express any concerns to him.    Chief Complaint  Patient presents with  . Follow-up    Pt was asked to visit [facility] for check up after not remembering his eye visit on 06/30/18; receptionist concerned that pt was not acting normal     Review of Systems  Patient Active Problem List   Diagnosis Date Noted  . Thrombocytopenia ([place]) 09/11/2015  . Cirrhosis ([place]) 09/11/2015  . Hepatitis C 04/18/2015  . Diabetes ([place]) 01/01/2015  . GERD (gastroesophageal reflux disease) 01/01/2015  . Hypertension 01/01/2015  . Fatty liver 01/01/2015   Allergies as of 07/06/2018   No Known Allergies     Medication List       Accurate as of July 06, 2018  9:22 AM. Always use your most recent med list.        acetaminophen 500 MG tablet Commonly known as:  TYLENOL Take 500 mg by mouth as needed.   ALPRAZolam 1 MG tablet Commonly known as:  XANAX Take 1 mg by mouth 3 (three) times daily.   amitriptyline 50 MG tablet Commonly known as:  ELAVIL Take 50 mg by mouth at bedtime. Uses for sleep   hydrochlorothiazide 25 MG tablet Commonly known as:  HYDRODIURIL Take 1 tablet (25 mg total) by mouth daily.   ibuprofen 600 MG tablet Commonly known as:  ADVIL,MOTRIN Take 1 tablet (600 mg total) by mouth every 8 (eight) hours as needed.   Insulin Glargine 100 UNIT/ML Solostar Pen Commonly known as:  LANTUS SOLOSTAR INJECT 50 UNITS UNDER THE SKIN AT BEDTIME   insulin glulisine 100 UNIT/ML injection Commonly known as:  APIDRA Inject 0.35 mLs (35 Units total) into the skin 3 (three) times daily with meals.   insulin lispro 100  UNIT/ML injection Commonly known as:  HUMALOG Inject 35 units tid with meals   lactulose 10 GM/15ML solution Commonly known as:  CHRONULAC Take by mouth 3 (three) times daily.   lactulose 10 GM/15ML solution Commonly known as:  CHRONULAC Take by mouth.   lactulose 10 GM/15ML solution Commonly known as:  CHRONULAC TAKE 30MLS BY MOUTH 3 TIMES A DAY   lisinopril 40 MG tablet Commonly known as:  PRINIVIL,ZESTRIL Take 1 tablet (40 mg total) by mouth daily.   methadone 10 MG tablet Commonly known as:  DOLOPHINE Take 10 mg by mouth daily.   oxycodone 30 MG immediate release tablet Commonly known as:  ROXICODONE Take 30 mg by mouth 2 (two) times daily. This is prescribed by his pain management team.   ranitidine 150 MG tablet Commonly known as:  ZANTAC Take 1 tablet (150 mg total) by mouth daily.   XIFAXAN 550 MG Tabs tablet Generic drug:  rifaximin Take by mouth.         Objective:   Physical Exam Cardiovascular:     Rate and Rhythm: Normal rate and regular rhythm.  Pulmonary:     Breath sounds: Wheezing present.  Neurological:     Mental Status: He is alert and oriented to person, place, and time.  Patient remembers address and DOB.  Extremities have trace edema.  Weight is up 12 pounds in the last 12 weeks. Stomach appears to be more bloated since last visit.  BP 117/67   Pulse (!) 104   Temp 98.3 F (36.8 C) (Oral)   Ht 5\' 6"  (1.676 m)   Wt 277 lb 8 oz (125.9 kg)   BMI 44.79 kg/m     Assessment & Plan:   1. Cirrhosis of liver without ascites, unspecified hepatic cirrhosis type [facility])  Staff had some question of mental confusion in last few days. Today he appears to be alert and oriented x3.  Recently had a number of teeth extractions and short increased narcotic/pain med prescribed for post dental extraction pain.   Check Today: - Comprehensive metabolic panel - CBC - Ammonia  2. Chest congestion Pt has some abnormal wheezing.   Order  Today: - DG Chest 2 View - loratadine (CLARITIN) 10 MG tablet; Take 1 tablet (10 mg total) by mouth daily.  Dispense: 30 tablet; Refill: 0 - guaifenesin (ROBITUSSIN) 100 MG/5ML syrup; Take 5 mLs (100 mg total) by mouth 3 (three) times daily as needed for cough.  Dispense: 240 mL; Refill: 0

## 2018-07-07 ENCOUNTER — Ambulatory Visit: Payer: Self-pay | Admitting: Ophthalmology

## 2018-07-07 LAB — COMPREHENSIVE METABOLIC PANEL
ALBUMIN: 3.9 g/dL (ref 3.5–5.5)
ALT: 15 IU/L (ref 0–44)
AST: 20 IU/L (ref 0–40)
Albumin/Globulin Ratio: 1.3 (ref 1.2–2.2)
Alkaline Phosphatase: 66 IU/L (ref 39–117)
BUN / CREAT RATIO: 14 (ref 9–20)
BUN: 14 mg/dL (ref 6–24)
Bilirubin Total: 0.4 mg/dL (ref 0.0–1.2)
CALCIUM: 9.5 mg/dL (ref 8.7–10.2)
CO2: 27 mmol/L (ref 20–29)
CREATININE: 1.02 mg/dL (ref 0.76–1.27)
Chloride: 96 mmol/L (ref 96–106)
GFR calc Af Amer: 94 mL/min/{1.73_m2} (ref >59)
GFR, EST NON AFRICAN AMERICAN: 81 mL/min/{1.73_m2} (ref >59)
GLOBULIN, TOTAL: 3.1 g/dL (ref 1.5–4.5)
Glucose: 330 mg/dL — ABNORMAL HIGH (ref 65–99)
Potassium: 4.4 mmol/L (ref 3.5–5.2)
SODIUM: 137 mmol/L (ref 134–144)
Total Protein: 7 g/dL (ref 6.0–8.5)

## 2018-07-07 LAB — CBC
HEMOGLOBIN: 13.8 g/dL (ref 13.0–17.7)
Hematocrit: 40.4 % (ref 37.5–51.0)
MCH: 31.1 pg (ref 26.6–33.0)
MCHC: 34.2 g/dL (ref 31.5–35.7)
MCV: 91 fL (ref 79–97)
PLATELETS: 81 10*3/uL — AB (ref 150–450)
RBC: 4.44 x10E6/uL (ref 4.14–5.80)
RDW: 13.2 % (ref 11.6–15.4)
WBC: 4.3 10*3/uL (ref 3.4–10.8)

## 2018-07-07 LAB — AMMONIA: Ammonia: 101 ug/dL (ref 27–102)

## 2018-07-13 ENCOUNTER — Ambulatory Visit: Payer: Self-pay | Admitting: Internal Medicine

## 2018-07-13 ENCOUNTER — Encounter: Payer: Self-pay | Admitting: Internal Medicine

## 2018-07-13 VITALS — BP 136/84 | HR 104 | Temp 98.3°F | Ht 67.0 in | Wt 258.4 lb

## 2018-07-13 DIAGNOSIS — K7460 Unspecified cirrhosis of liver: Secondary | ICD-10-CM

## 2018-07-13 DIAGNOSIS — Z794 Long term (current) use of insulin: Secondary | ICD-10-CM

## 2018-07-13 DIAGNOSIS — K7290 Hepatic failure, unspecified without coma: Secondary | ICD-10-CM

## 2018-07-13 DIAGNOSIS — G9341 Metabolic encephalopathy: Secondary | ICD-10-CM | POA: Insufficient documentation

## 2018-07-13 DIAGNOSIS — E119 Type 2 diabetes mellitus without complications: Secondary | ICD-10-CM

## 2018-07-13 DIAGNOSIS — R0989 Other specified symptoms and signs involving the circulatory and respiratory systems: Secondary | ICD-10-CM

## 2018-07-13 DIAGNOSIS — D696 Thrombocytopenia, unspecified: Secondary | ICD-10-CM

## 2018-07-13 DIAGNOSIS — K7682 Hepatic encephalopathy: Secondary | ICD-10-CM

## 2018-07-13 NOTE — Progress Notes (Signed)
Subjective:    Patient ID: Michael Mack, male    DOB: [birth year], 58 y.o.   MRN: [id number]  HPI   Patient is a 58 year old male who presents for one week follow up. Pt had some abnormal wheezing last week and was sent for chest x-ray. Pt reported chest congestion has not improved and remained the same. Pt reports that he is taking the Lactulose 3 times daily.   Chief Complaint  Patient presents with  . Follow-up    1 week follow up post labs and chest x-ray       Review of Systems Patient Active Problem List   Diagnosis Date Noted  . Thrombocytopenia ([place]) 09/11/2015  . Cirrhosis ([place]) 09/11/2015  . Hepatitis C 04/18/2015  . Diabetes ([place]) 01/01/2015  . GERD (gastroesophageal reflux disease) 01/01/2015  . Hypertension 01/01/2015  . Fatty liver 01/01/2015   Allergies as of 07/13/2018   No Known Allergies     Medication List       Accurate as of July 13, 2018 10:54 AM. Always use your most recent med list.        acetaminophen 500 MG tablet Commonly known as:  TYLENOL Take 500 mg by mouth as needed.   ALPRAZolam 1 MG tablet Commonly known as:  XANAX Take 1 mg by mouth 3 (three) times daily.   amitriptyline 50 MG tablet Commonly known as:  ELAVIL Take 50 mg by mouth at bedtime. Uses for sleep   guaifenesin 100 MG/5ML syrup Commonly known as:  ROBITUSSIN Take 5 mLs (100 mg total) by mouth 3 (three) times daily as needed for cough.   hydrochlorothiazide 25 MG tablet Commonly known as:  HYDRODIURIL Take 1 tablet (25 mg total) by mouth daily.   ibuprofen 600 MG tablet Commonly known as:  ADVIL,MOTRIN Take 1 tablet (600 mg total) by mouth every 8 (eight) hours as needed.   Insulin Glargine 100 UNIT/ML Solostar Pen Commonly known as:  LANTUS SOLOSTAR INJECT 50 UNITS UNDER THE SKIN AT BEDTIME   insulin glulisine 100 UNIT/ML injection Commonly known as:  APIDRA Inject 0.35 mLs (35 Units total) into the skin 3 (three) times daily with meals.   insulin  lispro 100 UNIT/ML injection Commonly known as:  HUMALOG Inject 35 units tid with meals   lactulose 10 GM/15ML solution Commonly known as:  CHRONULAC Take by mouth 3 (three) times daily.   lactulose 10 GM/15ML solution Commonly known as:  CHRONULAC Take by mouth.   lactulose 10 GM/15ML solution Commonly known as:  CHRONULAC TAKE 30MLS BY MOUTH 3 TIMES A DAY   lisinopril 40 MG tablet Commonly known as:  PRINIVIL,ZESTRIL Take 1 tablet (40 mg total) by mouth daily.   loratadine 10 MG tablet Commonly known as:  CLARITIN Take 1 tablet (10 mg total) by mouth daily.   methadone 10 MG tablet Commonly known as:  DOLOPHINE Take 10 mg by mouth daily.   oxycodone 30 MG immediate release tablet Commonly known as:  ROXICODONE Take 30 mg by mouth 2 (two) times daily. This is prescribed by his pain management team.   ranitidine 150 MG tablet Commonly known as:  ZANTAC Take 1 tablet (150 mg total) by mouth daily.   XIFAXAN 550 MG Tabs tablet Generic drug:  rifaximin Take by mouth.          Objective:   Physical Exam Cardiovascular:     Rate and Rhythm: Normal rate and regular rhythm.  Pulmonary:     Effort:  Pulmonary effort is normal.     Breath sounds: Normal breath sounds.  Neurological:     Mental Status: He is oriented to person, place, and time.    No edema of the lower extremities.         Assessment & Plan:   1. Chest congestion Chest X-ray normal.   2. Cirrhosis of liver without ascites, unspecified hepatic cirrhosis type ([place]) Ammonia level elevated to 100+ will recheck in May.    Pt has already increased his Lactulose (pt was previously not taking enough because he was trying to make the medication last longer) and he takes Xifaxan 550 mg daily.   Check in 6 Months:  - Ammonia; Future - POCT INR; Future - Vitamin D 1,25 dihydroxy; Future - Retic; Future   Keep FU appointment scheduled in May with labs a week prior.

## 2018-07-14 ENCOUNTER — Ambulatory Visit: Payer: Self-pay | Admitting: Ophthalmology

## 2018-07-14 ENCOUNTER — Other Ambulatory Visit: Payer: Self-pay | Admitting: Ophthalmology

## 2018-07-14 LAB — HM DIABETES EYE EXAM

## 2018-07-21 ENCOUNTER — Telehealth: Payer: Self-pay | Admitting: Pharmacist

## 2018-07-21 NOTE — Telephone Encounter (Signed)
07/21/2018 11:39:46 AM - Apidra Solostar refill  07/21/2018 Sending [employer] refill request to [facility] for Apidra Solostar Inject 35 units three times a day with meals (6) Max daily dose 105 units.AJ   07/21/2018 11:38:49 AM - Lantus Solostar refill  07/21/2018 Sending [employer] refill request to [facility] for Lantus Solostar Inject 50 units daily at bedtime #3.Delos Haring

## 2018-08-05 ENCOUNTER — Telehealth: Payer: Self-pay | Admitting: Pharmacist

## 2018-08-05 NOTE — Telephone Encounter (Signed)
08/05/2018 9:59:58 AM - Lantus & Apridra Solostar refills  08/05/2018 Faxing [employer] refill request for Lantus Solostar Inject 50 units daily at bedtime #3 & Apidra Solostar Inject 35 units three times a day with meals #6 (max daily dose 105 units).Delos Haring

## 2018-08-29 ENCOUNTER — Telehealth: Payer: Self-pay | Admitting: Pharmacist

## 2018-08-29 NOTE — Telephone Encounter (Signed)
08/29/2018 1:29:59 PM - Xifaxan refill  08/29/2018 Called [facility] spoke with Ishmael to change shipping address from patient to us-he then transferred me to the pharmacy for refill according to [place] they will ship to [place], allow 7-10 business days to receive, rx# [id number] 550mg .Michael Mack

## 2018-09-22 ENCOUNTER — Telehealth: Payer: Self-pay | Admitting: Pharmacist

## 2018-09-22 NOTE — Telephone Encounter (Signed)
09/22/2018 11:45:35 AM - Apidra refill refaxed to [facility]  09/22/2018 I received a note from [place] to check on patient's Apidra Solostar we faxed 08/05/2018-we received the Lantus Solostar but not Apidra. I called [employer] spoke with Judith Blonder reviewed and does see that patient is due refill-stated they did not receive request for [employer]. I explained that they were both faxed together, he advised to fax to Manager Fax# [phone] to be processed. I requested it be Expedited for shipment.Delos Haring

## 2018-10-19 ENCOUNTER — Telehealth: Payer: Self-pay | Admitting: Pharmacist

## 2018-10-19 NOTE — Telephone Encounter (Signed)
10/19/2018 3:19:34 PM - Lantus Solostar refill to [facility]  10/19/2018 Time for refill on Lantus Solostar Inject 50 units daily at bedtime #3, printed [employer] refill request and page 4 of application--per Lorrie @ [facility] changing provider from [place] to [place] during this COVID time. Will take to [facility] for Benjamine Mola to sign for this refill till [place] comes back.Delos Haring

## 2018-10-21 ENCOUNTER — Other Ambulatory Visit: Payer: Self-pay | Admitting: Internal Medicine

## 2018-10-21 DIAGNOSIS — R0989 Other specified symptoms and signs involving the circulatory and respiratory systems: Secondary | ICD-10-CM

## 2018-11-04 ENCOUNTER — Telehealth: Payer: Self-pay | Admitting: Pharmacist

## 2018-11-04 NOTE — Telephone Encounter (Signed)
11/04/2018 12:09:43 PM - Lantus Solostar refill  11/04/2018 Faxed [employer] refill request for Lantus Solostar Inject 50 units daily at bedtime, also sent page 1 of application due to provider change from [place] to [place] due to [place].Delos Haring

## 2018-11-09 ENCOUNTER — Other Ambulatory Visit: Payer: Self-pay

## 2018-11-09 DIAGNOSIS — E119 Type 2 diabetes mellitus without complications: Secondary | ICD-10-CM

## 2018-11-09 DIAGNOSIS — K7460 Unspecified cirrhosis of liver: Secondary | ICD-10-CM

## 2018-11-09 DIAGNOSIS — Z794 Long term (current) use of insulin: Secondary | ICD-10-CM

## 2018-11-09 DIAGNOSIS — I10 Essential (primary) hypertension: Secondary | ICD-10-CM

## 2018-11-11 ENCOUNTER — Telehealth: Payer: Self-pay | Admitting: Pharmacist

## 2018-11-11 NOTE — Telephone Encounter (Signed)
11/11/2018 10:51:15 AM - Lantus Solostar/Apridra Solostar  11/11/2018 I had previously faxed [employer] refill request & page 1 of application due to provider change for Lantus Solostar. I received notice from [employer] "Missing information". Called [employer] spoke with Sonia Side according to him patient has already received 4 shipments for this enrollment period, enrollment ends 01/19/19, patient can renew enrollment as early as 11/19/18. He stated since they already have page 4, I just need to send them pages 2 & 3, I have printed-mailing patient his page to sign & return.Delos Haring

## 2018-11-13 LAB — CBC
Hematocrit: 43.4 % (ref 37.5–51.0)
Hemoglobin: 15.1 g/dL (ref 13.0–17.7)
MCH: 31 pg (ref 26.6–33.0)
MCHC: 34.8 g/dL (ref 31.5–35.7)
MCV: 89 fL (ref 79–97)
Platelets: 101 10*3/uL — ABNORMAL LOW (ref 150–450)
RBC: 4.87 x10E6/uL (ref 4.14–5.80)
RDW: 13.5 % (ref 11.6–15.4)
WBC: 5.3 10*3/uL (ref 3.4–10.8)

## 2018-11-13 LAB — RETICULOCYTES: Retic Ct Pct: 1.7 % (ref 0.6–2.6)

## 2018-11-13 LAB — COMPREHENSIVE METABOLIC PANEL
ALT: 19 IU/L (ref 0–44)
AST: 26 IU/L (ref 0–40)
Albumin/Globulin Ratio: 1.2 (ref 1.2–2.2)
Albumin: 3.8 g/dL (ref 3.8–4.9)
Alkaline Phosphatase: 73 IU/L (ref 39–117)
BUN/Creatinine Ratio: 12 (ref 9–20)
BUN: 10 mg/dL (ref 6–24)
Bilirubin Total: 0.6 mg/dL (ref 0.0–1.2)
CO2: 22 mmol/L (ref 20–29)
Calcium: 8.9 mg/dL (ref 8.7–10.2)
Chloride: 102 mmol/L (ref 96–106)
Creatinine, Ser: 0.82 mg/dL (ref 0.76–1.27)
GFR calc Af Amer: 113 mL/min/{1.73_m2} (ref >59)
GFR calc non Af Amer: 98 mL/min/{1.73_m2} (ref >59)
Globulin, Total: 3.3 g/dL (ref 1.5–4.5)
Glucose: 117 mg/dL — ABNORMAL HIGH (ref 65–99)
Potassium: 3.7 mmol/L (ref 3.5–5.2)
Sodium: 139 mmol/L (ref 134–144)
Total Protein: 7.1 g/dL (ref 6.0–8.5)

## 2018-11-13 LAB — VITAMIN D 1,25 DIHYDROXY
Vitamin D 1, 25 (OH)2 Total: 26 pg/mL
Vitamin D2 1, 25 (OH)2: <10 pg/mL
Vitamin D3 1, 25 (OH)2: 25 pg/mL

## 2018-11-13 LAB — AMMONIA: Ammonia: 135 ug/dL (ref 40–200)

## 2018-11-13 LAB — HEMOGLOBIN A1C
Est. average glucose Bld gHb Est-mCnc: 169 mg/dL
Hgb A1c MFr Bld: 7.5 % — ABNORMAL HIGH (ref 4.8–5.6)

## 2018-11-13 LAB — LIPID PANEL
Chol/HDL Ratio: 3.2 ratio (ref 0.0–5.0)
Cholesterol, Total: 156 mg/dL (ref 100–199)
HDL: 49 mg/dL (ref >39)
LDL Calculated: 81 mg/dL (ref 0–99)
Triglycerides: 128 mg/dL (ref 0–149)
VLDL Cholesterol Cal: 26 mg/dL (ref 5–40)

## 2018-11-13 LAB — URINALYSIS
Bilirubin, UA: NEGATIVE
Glucose, UA: NEGATIVE
Ketones, UA: NEGATIVE
Nitrite, UA: NEGATIVE
Protein,UA: NEGATIVE
RBC, UA: NEGATIVE
Specific Gravity, UA: 1.019 (ref 1.005–1.030)
Urobilinogen, Ur: 0.2 mg/dL (ref 0.2–1.0)
pH, UA: 5 (ref 5.0–7.5)

## 2018-11-13 LAB — PSA: Prostate Specific Ag, Serum: <0.1 ng/mL (ref 0.0–4.0)

## 2018-11-16 ENCOUNTER — Other Ambulatory Visit: Payer: Self-pay

## 2018-11-16 ENCOUNTER — Ambulatory Visit: Payer: Self-pay | Admitting: Internal Medicine

## 2018-11-16 DIAGNOSIS — I10 Essential (primary) hypertension: Secondary | ICD-10-CM

## 2018-11-16 NOTE — Progress Notes (Signed)
Phone visit  Subjective:    Patient ID: Michael Mack, male    DOB: 03-04-1961, 58 y.o.   MRN: [id number]   HPI  Pt complains of feet being sore. Rt foot feels like "he's walking on a rock". Pt states he has no swelling, redness, or heat to the feet. Pt walks barefoot. A1c is 7.5. Labs look good.   Patient Active Problem List   Diagnosis Date Noted  . Hepatic encephalopathy ([place]) 07/13/2018  . Thrombocytopenia ([place]) 09/11/2015  . Cirrhosis ([place]) 09/11/2015  . Hepatitis C 04/18/2015  . Diabetes ([place]) 01/01/2015  . GERD (gastroesophageal reflux disease) 01/01/2015  . Hypertension 01/01/2015  . Fatty liver 01/01/2015   Allergies as of 11/16/2018   No Known Allergies     Medication List       Accurate as of Nov 16, 2018 10:39 AM. If you have any questions, ask your nurse or doctor.        acetaminophen 500 MG tablet Commonly known as:  TYLENOL Take 500 mg by mouth as needed.   ALPRAZolam 1 MG tablet Commonly known as:  XANAX Take 1 mg by mouth 3 (three) times daily.   amitriptyline 50 MG tablet Commonly known as:  ELAVIL Take 50 mg by mouth at bedtime. Uses for sleep   guaifenesin 100 MG/5ML syrup Commonly known as:  ROBITUSSIN Take 5 mLs (100 mg total) by mouth 3 (three) times daily as needed for cough.   hydrochlorothiazide 25 MG tablet Commonly known as:  HYDRODIURIL Take 1 tablet (25 mg total) by mouth daily.   ibuprofen 600 MG tablet Commonly known as:  ADVIL Take 1 tablet (600 mg total) by mouth every 8 (eight) hours as needed.   Insulin Glargine 100 UNIT/ML Solostar Pen Commonly known as:  Lantus SoloStar INJECT 50 UNITS UNDER THE SKIN AT BEDTIME   insulin glulisine 100 UNIT/ML injection Commonly known as:  APIDRA Inject 0.35 mLs (35 Units total) into the skin 3 (three) times daily with meals.   insulin lispro 100 UNIT/ML injection Commonly known as:  HumaLOG Inject 35 units tid with meals   lactulose 10 GM/15ML solution Commonly known as:   CHRONULAC Take by mouth 3 (three) times daily.   lactulose 10 GM/15ML solution Commonly known as:  CHRONULAC Take by mouth.   lactulose 10 GM/15ML solution Commonly known as:  CHRONULAC TAKE 30MLS BY MOUTH 3 TIMES A DAY   lisinopril 40 MG tablet Commonly known as:  ZESTRIL Take 1 tablet (40 mg total) by mouth daily.   loratadine 10 MG tablet Commonly known as:  CLARITIN TAKE ONE TABLET BY MOUTH EVERY DAY   methadone 10 MG tablet Commonly known as:  DOLOPHINE Take 10 mg by mouth daily.   oxycodone 30 MG immediate release tablet Commonly known as:  ROXICODONE Take 30 mg by mouth 2 (two) times daily. This is prescribed by his pain management team.   ranitidine 150 MG tablet Commonly known as:  ZANTAC Take 1 tablet (150 mg total) by mouth daily.   Xifaxan 550 MG Tabs tablet Generic drug:  rifaximin Take by mouth.        Review of Systems Deferred     Objective:   Physical Exam         Assessment & Plan:  Plan: Pt needs to wear a heel on his shoes to help the tendons. The problem could be acute tendonitis. Pt needs to not go barefoot for about 3 weeks. He needs to wear bedroom shoes  or flip flops around the house. RTC 3 months.

## 2018-11-28 ENCOUNTER — Telehealth: Payer: Self-pay | Admitting: Pharmacist

## 2018-11-28 NOTE — Telephone Encounter (Signed)
11/28/2018 9:54:23 AM - Lantus Solostar & Apidra Solostar renewal  11/28/2018 Faxed [employer] renewal for Lantus Solostar Inject 50 units daily at bedtime #3 & Apidra Solostar Inject 35 units 3 times a day with meals # 6.Delos Haring

## 2018-12-12 ENCOUNTER — Telehealth: Payer: Self-pay | Admitting: Pharmacist

## 2018-12-12 NOTE — Telephone Encounter (Signed)
12/12/2018 8:36:44 AM - [employer] approval-Lantus Solostar & Apidra Solostar  12/12/2018 Received letter from [employer] stating patient approved till 12/09/2019 for Lantus Solostar & Apidra Engineer, civil (consulting).Delos Haring

## 2018-12-19 ENCOUNTER — Telehealth: Payer: Self-pay

## 2018-12-19 NOTE — Telephone Encounter (Signed)
Patient called clinic needed DM test strips trying to call patient to give him times when to pick up.  Phone was disconnected.

## 2019-01-18 ENCOUNTER — Telehealth: Payer: Self-pay | Admitting: Pharmacist

## 2019-01-18 NOTE — Telephone Encounter (Signed)
01/18/2019 10:45:35 AM - Apidra Solostar refill to [facility]  01/18/2019 Taking [employer] refill request for [name] 35 units three times a day with meals #6 to [facility] for provider to sign.Delos Haring

## 2019-01-18 NOTE — Telephone Encounter (Signed)
01/18/2019 10:38:57 AM - Lantus Solostar refill to [facility]  01/18/2019 Taking [employer] refill request to [facility] for provider to sign on Lantus Solostar Inject 50 units under the skin daily at bedtime (3).Michael Mack

## 2019-02-08 ENCOUNTER — Other Ambulatory Visit: Payer: Self-pay

## 2019-02-09 ENCOUNTER — Other Ambulatory Visit: Payer: Self-pay

## 2019-02-09 DIAGNOSIS — I10 Essential (primary) hypertension: Secondary | ICD-10-CM

## 2019-02-10 LAB — CBC WITH DIFFERENTIAL
Basophils Absolute: 0 10*3/uL (ref 0.0–0.2)
Basos: 0 %
EOS (ABSOLUTE): 0.3 10*3/uL (ref 0.0–0.4)
Eos: 6 %
Hematocrit: 38 % (ref 37.5–51.0)
Hemoglobin: 12.5 g/dL — ABNORMAL LOW (ref 13.0–17.7)
Immature Grans (Abs): 0 10*3/uL (ref 0.0–0.1)
Immature Granulocytes: 0 %
Lymphocytes Absolute: 1.4 10*3/uL (ref 0.7–3.1)
Lymphs: 30 %
MCH: 29.6 pg (ref 26.6–33.0)
MCHC: 32.9 g/dL (ref 31.5–35.7)
MCV: 90 fL (ref 79–97)
Monocytes Absolute: 0.4 10*3/uL (ref 0.1–0.9)
Monocytes: 8 %
Neutrophils Absolute: 2.6 10*3/uL (ref 1.4–7.0)
Neutrophils: 56 %
RBC: 4.22 x10E6/uL (ref 4.14–5.80)
RDW: 13.7 % (ref 11.6–15.4)
WBC: 4.6 10*3/uL (ref 3.4–10.8)

## 2019-02-10 LAB — COMPREHENSIVE METABOLIC PANEL
ALT: 14 IU/L (ref 0–44)
AST: 29 IU/L (ref 0–40)
Albumin/Globulin Ratio: 1.4 (ref 1.2–2.2)
Albumin: 3.8 g/dL (ref 3.8–4.9)
Alkaline Phosphatase: 61 IU/L (ref 39–117)
BUN/Creatinine Ratio: 21 — ABNORMAL HIGH (ref 9–20)
BUN: 20 mg/dL (ref 6–24)
Bilirubin Total: 0.4 mg/dL (ref 0.0–1.2)
CO2: 25 mmol/L (ref 20–29)
Calcium: 8.6 mg/dL — ABNORMAL LOW (ref 8.7–10.2)
Chloride: 117 mmol/L (ref 96–106)
Creatinine, Ser: 0.94 mg/dL (ref 0.76–1.27)
GFR calc Af Amer: 104 mL/min/{1.73_m2} (ref >59)
GFR calc non Af Amer: 90 mL/min/{1.73_m2} (ref >59)
Globulin, Total: 2.8 g/dL (ref 1.5–4.5)
Glucose: 132 mg/dL — ABNORMAL HIGH (ref 65–99)
Potassium: 4.9 mmol/L (ref 3.5–5.2)
Sodium: 143 mmol/L (ref 134–144)
Total Protein: 6.6 g/dL (ref 6.0–8.5)

## 2019-02-10 LAB — HEMOGLOBIN A1C
Est. average glucose Bld gHb Est-mCnc: 183 mg/dL
Hgb A1c MFr Bld: 8 % — ABNORMAL HIGH (ref 4.8–5.6)

## 2019-02-10 LAB — AMMONIA

## 2019-02-15 ENCOUNTER — Ambulatory Visit: Payer: Self-pay | Admitting: Internal Medicine

## 2019-02-22 ENCOUNTER — Ambulatory Visit: Payer: Self-pay | Admitting: Internal Medicine

## 2019-02-22 ENCOUNTER — Other Ambulatory Visit: Payer: Self-pay

## 2019-02-22 VITALS — BP 123/71 | HR 88 | Temp 97.1°F | Ht 68.0 in | Wt 278.3 lb

## 2019-02-22 DIAGNOSIS — I10 Essential (primary) hypertension: Secondary | ICD-10-CM

## 2019-02-22 DIAGNOSIS — D696 Thrombocytopenia, unspecified: Secondary | ICD-10-CM

## 2019-02-22 DIAGNOSIS — E119 Type 2 diabetes mellitus without complications: Secondary | ICD-10-CM

## 2019-02-22 DIAGNOSIS — K7460 Unspecified cirrhosis of liver: Secondary | ICD-10-CM

## 2019-02-22 NOTE — Progress Notes (Signed)
Subjective:    Patient ID: Michael Mack, male    DOB: [birth year], 58 y.o.   MRN: [id number]  HPI  Patient is a 58 year old male presenting for a telephonic visit.  Review of Systems Patient Active Problem List   Diagnosis Date Noted  . Hepatic encephalopathy ([place]) 07/13/2018  . Thrombocytopenia ([place]) 09/11/2015  . Cirrhosis ([place]) 09/11/2015  . Hepatitis C 04/18/2015  . Diabetes ([place]) 01/01/2015  . GERD (gastroesophageal reflux disease) 01/01/2015  . Hypertension 01/01/2015  . Fatty liver 01/01/2015   Allergies as of 02/22/2019   No Known Allergies     Medication List       Accurate as of February 22, 2019 10:07 AM. If you have any questions, ask your nurse or doctor.        acetaminophen 500 MG tablet Commonly known as: TYLENOL Take 500 mg by mouth as needed.   ALPRAZolam 1 MG tablet Commonly known as: XANAX Take 1 mg by mouth 3 (three) times daily.   amitriptyline 50 MG tablet Commonly known as: ELAVIL Take 50 mg by mouth at bedtime. Uses for sleep   guaifenesin 100 MG/5ML syrup Commonly known as: ROBITUSSIN Take 5 mLs (100 mg total) by mouth 3 (three) times daily as needed for cough.   hydrochlorothiazide 25 MG tablet Commonly known as: HYDRODIURIL Take 1 tablet (25 mg total) by mouth daily.   ibuprofen 600 MG tablet Commonly known as: ADVIL Take 1 tablet (600 mg total) by mouth every 8 (eight) hours as needed.   Insulin Glargine 100 UNIT/ML Solostar Pen Commonly known as: Lantus SoloStar INJECT 50 UNITS UNDER THE SKIN AT BEDTIME   insulin glulisine 100 UNIT/ML injection Commonly known as: APIDRA Inject 0.35 mLs (35 Units total) into the skin 3 (three) times daily with meals.   insulin lispro 100 UNIT/ML injection Commonly known as: HumaLOG Inject 35 units tid with meals   lactulose 10 GM/15ML solution Commonly known as: CHRONULAC Take by mouth 3 (three) times daily.   lactulose 10 GM/15ML solution Commonly known as: CHRONULAC Take by  mouth.   lactulose 10 GM/15ML solution Commonly known as: CHRONULAC TAKE 30MLS BY MOUTH 3 TIMES A DAY   lisinopril 40 MG tablet Commonly known as: ZESTRIL Take 1 tablet (40 mg total) by mouth daily.   loratadine 10 MG tablet Commonly known as: CLARITIN TAKE ONE TABLET BY MOUTH EVERY DAY   methadone 10 MG tablet Commonly known as: DOLOPHINE Take 10 mg by mouth daily.   oxycodone 30 MG immediate release tablet Commonly known as: ROXICODONE Take 30 mg by mouth 2 (two) times daily. This is prescribed by his pain management team.   ranitidine 150 MG tablet Commonly known as: ZANTAC Take 1 tablet (150 mg total) by mouth daily.   Xifaxan 550 MG Tabs tablet Generic drug: rifaximin Take by mouth.          Objective:   Physical Exam  No PE - telephonic visit      Assessment & Plan:  1. Hypertension Appears to be stable.  2. Cirrhosis of liver without ascites, unspecified hepatic cirrhosis type ([place]) Fu on his hepatologist at [facility] soon. Last ammonia level was somewhat elevated in May. No medication change. FU labs in 90 days. Will also have labs at [facility].   3. Diabetes  A1C is slightly more elevated at 8.0. Pt says he is not following his diet as carefully. No medication change.   4. Thrombocytopenia Appears to be stable. Slightly improved.  Labs ordered: -metc -cbc -reticulocyte count -ammonia level -INR -UA + reflex -A1C

## 2019-03-20 ENCOUNTER — Telehealth: Payer: Self-pay | Admitting: Pharmacist

## 2019-03-20 NOTE — Telephone Encounter (Signed)
03/20/2019 11:46:41 AM - Apidra Solostar refill to [facility]  03/20/2019 Putting a [employer] refill request in [facility] folder for [place] to sign for [name] 35 units three times a day with meals #6.Delos Haring

## 2019-04-11 ENCOUNTER — Telehealth: Payer: Self-pay | Admitting: Pharmacist

## 2019-04-11 NOTE — Telephone Encounter (Signed)
04/11/2019 8:18:48 AM - Apidra Solostar refill to [facility]  04/11/2019 Faxed [employer] refill for [name] 35 units three times daily with meals #6 boxes.Delos Haring

## 2019-04-12 ENCOUNTER — Telehealth: Payer: Self-pay | Admitting: Pharmacist

## 2019-04-12 NOTE — Telephone Encounter (Signed)
04/12/2019 11:49:01 AM - Lantus Solostar refill to [facility]  04/12/2019 Putting [employer] refill request for Lantus Solostar Inject 50 units daily at bedtime #3, in [facility] folder for [place] to sign.Delos Haring

## 2019-04-18 ENCOUNTER — Telehealth: Payer: Self-pay | Admitting: Pharmacist

## 2019-04-18 NOTE — Telephone Encounter (Signed)
04/18/2019 8:21:08 AM - Lantus Solostar refill to [facility]  04/18/2019 Faxed [employer] refill for Lantus Solostar Inject 50 units daily at bedtime #3.Delos Haring

## 2019-05-17 ENCOUNTER — Other Ambulatory Visit: Payer: Self-pay

## 2019-05-17 DIAGNOSIS — D696 Thrombocytopenia, unspecified: Secondary | ICD-10-CM

## 2019-05-17 DIAGNOSIS — E119 Type 2 diabetes mellitus without complications: Secondary | ICD-10-CM

## 2019-05-17 DIAGNOSIS — K7460 Unspecified cirrhosis of liver: Secondary | ICD-10-CM

## 2019-05-18 LAB — COMPREHENSIVE METABOLIC PANEL
ALT: 22 IU/L (ref 0–44)
AST: 29 IU/L (ref 0–40)
Albumin/Globulin Ratio: 1.1 — ABNORMAL LOW (ref 1.2–2.2)
Albumin: 4.4 g/dL (ref 3.8–4.9)
Alkaline Phosphatase: 88 IU/L (ref 39–117)
BUN/Creatinine Ratio: 18 (ref 9–20)
BUN: 15 mg/dL (ref 6–24)
Bilirubin Total: 0.8 mg/dL (ref 0.0–1.2)
CO2: 23 mmol/L (ref 20–29)
Calcium: 9.8 mg/dL (ref 8.7–10.2)
Chloride: 98 mmol/L (ref 96–106)
Creatinine, Ser: 0.83 mg/dL (ref 0.76–1.27)
GFR calc Af Amer: 112 mL/min/{1.73_m2} (ref >59)
GFR calc non Af Amer: 97 mL/min/{1.73_m2} (ref >59)
Globulin, Total: 4 g/dL (ref 1.5–4.5)
Glucose: 187 mg/dL — ABNORMAL HIGH (ref 65–99)
Potassium: 3.5 mmol/L (ref 3.5–5.2)
Sodium: 140 mmol/L (ref 134–144)
Total Protein: 8.4 g/dL (ref 6.0–8.5)

## 2019-05-18 LAB — MICROSCOPIC EXAMINATION
Casts: NONE SEEN /lpf
WBC, UA: >30 /hpf — AB (ref 0–5)

## 2019-05-18 LAB — URINALYSIS, ROUTINE W REFLEX MICROSCOPIC
Bilirubin, UA: NEGATIVE
Nitrite, UA: NEGATIVE
RBC, UA: NEGATIVE
Specific Gravity, UA: >=1.03 — AB (ref 1.005–1.030)
Urobilinogen, Ur: 0.2 mg/dL (ref 0.2–1.0)
pH, UA: 6 (ref 5.0–7.5)

## 2019-05-18 LAB — PROTIME-INR
INR: 1.1 (ref 0.9–1.2)
Prothrombin Time: 12.2 s — ABNORMAL HIGH (ref 9.1–12.0)

## 2019-05-18 LAB — RETICULOCYTES: Retic Ct Pct: 2.1 % (ref 0.6–2.6)

## 2019-05-18 LAB — CBC
Hematocrit: 50.9 % (ref 37.5–51.0)
Hemoglobin: 17.5 g/dL (ref 13.0–17.7)
MCH: 30.3 pg (ref 26.6–33.0)
MCHC: 34.4 g/dL (ref 31.5–35.7)
MCV: 88 fL (ref 79–97)
Platelets: 118 10*3/uL — ABNORMAL LOW (ref 150–450)
RBC: 5.77 x10E6/uL (ref 4.14–5.80)
RDW: 14 % (ref 11.6–15.4)
WBC: 6.3 10*3/uL (ref 3.4–10.8)

## 2019-05-18 LAB — HEMOGLOBIN A1C
Est. average glucose Bld gHb Est-mCnc: 177 mg/dL
Hgb A1c MFr Bld: 7.8 % — ABNORMAL HIGH (ref 4.8–5.6)

## 2019-05-18 LAB — AMMONIA: Ammonia: 72 ug/dL (ref 40–200)

## 2019-05-24 ENCOUNTER — Ambulatory Visit: Payer: Self-pay | Admitting: Internal Medicine

## 2019-05-24 ENCOUNTER — Other Ambulatory Visit: Payer: Self-pay

## 2019-05-24 DIAGNOSIS — K7460 Unspecified cirrhosis of liver: Secondary | ICD-10-CM

## 2019-05-24 DIAGNOSIS — E119 Type 2 diabetes mellitus without complications: Secondary | ICD-10-CM

## 2019-05-24 DIAGNOSIS — D696 Thrombocytopenia, unspecified: Secondary | ICD-10-CM

## 2019-05-24 DIAGNOSIS — I10 Essential (primary) hypertension: Secondary | ICD-10-CM

## 2019-05-24 NOTE — Progress Notes (Signed)
Subjective:    Patient ID: Michael Mack, male    DOB: 11/13/1960, 58 y.o.   MRN: [id number]  HPI  Patient is a 58 year old male presenting for a telephonic visit. Patient reports that he has not had contact with anyone COVID-19 positive.   Patient reports that bone spurs in feet have alleviated.  Patient reports that he check blood sugar 3-4 times a day. Pt reports that blood sugar "skips around" but that it is usually under 200. Pt declines frequent urination as well as burning when urinating.  Pt reports pain on his left side close to his collar bone that occurs sporadically.      Review of Systems Patient Active Problem List   Diagnosis Date Noted  . Hepatic encephalopathy ([place]) 07/13/2018  . Thrombocytopenia ([place]) 09/11/2015  . Cirrhosis ([place]) 09/11/2015  . Hepatitis C 04/18/2015  . Diabetes ([place]) 01/01/2015  . GERD (gastroesophageal reflux disease) 01/01/2015  . Hypertension 01/01/2015  . Fatty liver 01/01/2015   Allergies as of 05/24/2019   No Known Allergies     Medication List       Accurate as of May 24, 2019  9:25 AM. If you have any questions, ask your nurse or doctor.        acetaminophen 500 MG tablet Commonly known as: TYLENOL Take 500 mg by mouth as needed.   ALPRAZolam 1 MG tablet Commonly known as: XANAX Take 1 mg by mouth 3 (three) times daily.   amitriptyline 50 MG tablet Commonly known as: ELAVIL Take 50 mg by mouth at bedtime. Uses for sleep   guaifenesin 100 MG/5ML syrup Commonly known as: ROBITUSSIN Take 5 mLs (100 mg total) by mouth 3 (three) times daily as needed for cough.   hydrochlorothiazide 25 MG tablet Commonly known as: HYDRODIURIL Take 1 tablet (25 mg total) by mouth daily.   ibuprofen 600 MG tablet Commonly known as: ADVIL Take 1 tablet (600 mg total) by mouth every 8 (eight) hours as needed.   Insulin Glargine 100 UNIT/ML Solostar Pen Commonly known as: Lantus SoloStar INJECT 50 UNITS UNDER THE SKIN AT BEDTIME  insulin glulisine 100 UNIT/ML injection Commonly known as: APIDRA Inject 0.35 mLs (35 Units total) into the skin 3 (three) times daily with meals.   insulin lispro 100 UNIT/ML injection Commonly known as: HumaLOG Inject 35 units tid with meals   lactulose 10 GM/15ML solution Commonly known as: CHRONULAC Take by mouth 3 (three) times daily.   lactulose 10 GM/15ML solution Commonly known as: CHRONULAC Take by mouth.   lactulose 10 GM/15ML solution Commonly known as: CHRONULAC TAKE 30MLS BY MOUTH 3 TIMES A DAY   lisinopril 40 MG tablet Commonly known as: ZESTRIL Take 1 tablet (40 mg total) by mouth daily.   loratadine 10 MG tablet Commonly known as: CLARITIN TAKE ONE TABLET BY MOUTH EVERY DAY   methadone 10 MG tablet Commonly known as: DOLOPHINE Take 10 mg by mouth daily.   oxycodone 30 MG immediate release tablet Commonly known as: ROXICODONE Take 30 mg by mouth 2 (two) times daily. This is prescribed by his pain management team.   ranitidine 150 MG tablet Commonly known as: ZANTAC Take 1 tablet (150 mg total) by mouth daily.   Xifaxan 550 MG Tabs tablet Generic drug: rifaximin Take by mouth.          Objective:   Physical Exam   No PE - telephonic visit     Assessment & Plan:  1. Essential hypertension Appears  to be stable. Recent outside reading of 150/70 recorded in chart.   2. Cirrhosis of liver without ascites, unspecified hepatic cirrhosis type ([place]) Followed at [facility] with hepatologist. Last amonia level was much improved at 72. Recent cat scan of adnomen and MRI FU has found a lesion on his liver. A fu is scheduled to access stability.  - Ammonia; Future - CEA; Future - Gamma GT; Future - Protime-INR; Future - UA/M w/rflx Culture, Routine; Future  3. Diabetes mellitus without complication (HCC) H8O remains stable at 7.8. Pt reports checking sugars frequently during the day. Pt has recored a few readings in the 79s. No change in his program being  recommended.  - CBC; Future - Comp Met (CMET); Future - HgB A1c; Future  4. 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 Platelet count appears to be stable with slight improvement in the number.   Plans is to FU in 3 months with labs a week prior.

## 2019-05-30 ENCOUNTER — Telehealth: Payer: Self-pay | Admitting: Pharmacist

## 2019-05-30 NOTE — Telephone Encounter (Signed)
05/30/2019 3:47:22 PM - Apidra Solostar refill to [facility]  05/30/2019 Sending [employer] refill request for [name] 35 units under the skin 3 times a day with meals, #6 Max daily dose 105 units.Delos Haring

## 2019-06-26 ENCOUNTER — Telehealth: Payer: Self-pay | Admitting: Pharmacist

## 2019-06-26 NOTE — Telephone Encounter (Signed)
06/26/2019 10:05:46 AM - Lantus Solostar to provider for refill  06/26/2019 Sending [facility] refill request for Lantus Solostar Inject 5- units daily at bedtime #3.Delos Haring

## 2019-06-27 ENCOUNTER — Telehealth: Payer: Self-pay | Admitting: Pharmacist

## 2019-06-27 NOTE — Telephone Encounter (Signed)
06/27/2019 Faxed [employer] refill request for Lantus Solostar Inject 50 units daily at bedtime #3.Delos Haring

## 2019-07-11 ENCOUNTER — Other Ambulatory Visit: Payer: Self-pay | Admitting: Internal Medicine

## 2019-07-11 DIAGNOSIS — I10 Essential (primary) hypertension: Secondary | ICD-10-CM

## 2019-07-20 ENCOUNTER — Ambulatory Visit: Payer: Self-pay | Admitting: Ophthalmology

## 2019-08-08 ENCOUNTER — Telehealth: Payer: Self-pay | Admitting: Gerontology

## 2019-08-08 NOTE — Telephone Encounter (Signed)
I called pt at 10:49 on 2/16 to return a voicemail he left over the weekend. I was unable to reach him but I left a vm with a call back number

## 2019-08-09 ENCOUNTER — Other Ambulatory Visit: Payer: Self-pay | Admitting: Internal Medicine

## 2019-08-09 DIAGNOSIS — I10 Essential (primary) hypertension: Secondary | ICD-10-CM

## 2019-08-16 ENCOUNTER — Other Ambulatory Visit: Payer: Self-pay

## 2019-08-17 ENCOUNTER — Telehealth: Payer: Self-pay | Admitting: Pharmacist

## 2019-08-17 NOTE — Telephone Encounter (Signed)
08/17/2019 12:14:50 PM - Lantus Solostar refill to provider -- Michael Mack - Thursday, August 17, 2019 12:13 PM -- Taking [employer] refill request to [facility] for Chaplin to sign for Lantus Solostar Pen Max daily dose 36 units daily @ 10PM #3.  08/17/2019 12:33:37 PM - Lantus refill to [facility] -- Michael Mack - Thursday, August 17, 2019 12:32 PM -- Sending refill request to [facility] for Lantus Solostar Pen Inject 50 units under the skin daily at bedtime # 3.

## 2019-08-23 ENCOUNTER — Ambulatory Visit: Payer: Self-pay | Admitting: Internal Medicine

## 2019-08-25 ENCOUNTER — Telehealth: Payer: Self-pay | Admitting: Pharmacist

## 2019-08-25 NOTE — Telephone Encounter (Signed)
08/25/2019 1:34:24 PM - Lantus Solostar & Apidra Solostar refill to [place] - Friday, August 25, 2019 1:34 PM -- Faxed refill to [facility] for Lantus Solostar Inject 50 units daily at bedtime #3 & also faxed refill to [facility] for [name] 35 units under the skin 3 times daily with meals #6.

## 2019-08-30 ENCOUNTER — Other Ambulatory Visit: Payer: Self-pay

## 2019-08-30 DIAGNOSIS — K7460 Unspecified cirrhosis of liver: Secondary | ICD-10-CM

## 2019-08-30 DIAGNOSIS — E119 Type 2 diabetes mellitus without complications: Secondary | ICD-10-CM

## 2019-09-01 LAB — CBC
Hematocrit: 44.1 % (ref 37.5–51.0)
Hemoglobin: 14.6 g/dL (ref 13.0–17.7)
MCH: 30.5 pg (ref 26.6–33.0)
MCHC: 33.1 g/dL (ref 31.5–35.7)
MCV: 92 fL (ref 79–97)
Platelets: 96 10*3/uL — CL (ref 150–450)
RBC: 4.78 x10E6/uL (ref 4.14–5.80)
RDW: 14.1 % (ref 11.6–15.4)
WBC: 6.3 10*3/uL (ref 3.4–10.8)

## 2019-09-01 LAB — GAMMA GT: GGT: 51 IU/L (ref 0–65)

## 2019-09-01 LAB — COMPREHENSIVE METABOLIC PANEL
ALT: 18 IU/L (ref 0–44)
AST: 30 IU/L (ref 0–40)
Albumin/Globulin Ratio: 0.9 — ABNORMAL LOW (ref 1.2–2.2)
Albumin: 3.5 g/dL — ABNORMAL LOW (ref 3.8–4.9)
Alkaline Phosphatase: 64 IU/L (ref 39–117)
BUN/Creatinine Ratio: 16 (ref 9–20)
BUN: 13 mg/dL (ref 6–24)
Bilirubin Total: 0.5 mg/dL (ref 0.0–1.2)
CO2: 25 mmol/L (ref 20–29)
Calcium: 9.4 mg/dL (ref 8.7–10.2)
Chloride: 98 mmol/L (ref 96–106)
Creatinine, Ser: 0.83 mg/dL (ref 0.76–1.27)
GFR calc Af Amer: 112 mL/min/{1.73_m2} (ref >59)
GFR calc non Af Amer: 97 mL/min/{1.73_m2} (ref >59)
Globulin, Total: 3.7 g/dL (ref 1.5–4.5)
Glucose: 114 mg/dL — ABNORMAL HIGH (ref 65–99)
Potassium: 4.9 mmol/L (ref 3.5–5.2)
Sodium: 138 mmol/L (ref 134–144)
Total Protein: 7.2 g/dL (ref 6.0–8.5)

## 2019-09-01 LAB — HEMOGLOBIN A1C
Est. average glucose Bld gHb Est-mCnc: 206 mg/dL
Hgb A1c MFr Bld: 8.8 % — ABNORMAL HIGH (ref 4.8–5.6)

## 2019-09-01 LAB — AMMONIA: Ammonia: 84 ug/dL (ref 40–200)

## 2019-09-01 LAB — CEA: CEA: 7.8 ng/mL — ABNORMAL HIGH (ref 0.0–4.7)

## 2019-09-02 LAB — UA/M W/RFLX CULTURE, ROUTINE
Bilirubin, UA: NEGATIVE
Glucose, UA: NEGATIVE
Ketones, UA: NEGATIVE
Leukocytes,UA: NEGATIVE
Nitrite, UA: NEGATIVE
Protein,UA: NEGATIVE
RBC, UA: NEGATIVE
Specific Gravity, UA: 1.02 (ref 1.005–1.030)
Urobilinogen, Ur: 0.2 mg/dL (ref 0.2–1.0)
pH, UA: 6 (ref 5.0–7.5)

## 2019-09-02 LAB — URINE CULTURE, REFLEX: Organism ID, Bacteria: NO GROWTH

## 2019-09-02 LAB — MICROSCOPIC EXAMINATION
Bacteria, UA: NONE SEEN
Casts: NONE SEEN /lpf
Epithelial Cells (non renal): NONE SEEN /hpf (ref 0–10)
RBC, Urine: NONE SEEN /hpf (ref 0–2)

## 2019-09-06 ENCOUNTER — Other Ambulatory Visit: Payer: Self-pay

## 2019-09-06 ENCOUNTER — Ambulatory Visit: Payer: Self-pay | Admitting: Internal Medicine

## 2019-09-06 DIAGNOSIS — E119 Type 2 diabetes mellitus without complications: Secondary | ICD-10-CM

## 2019-09-06 DIAGNOSIS — Z794 Long term (current) use of insulin: Secondary | ICD-10-CM

## 2019-09-06 DIAGNOSIS — I10 Essential (primary) hypertension: Secondary | ICD-10-CM

## 2019-09-06 NOTE — Progress Notes (Signed)
Subjective:    Patient ID: Michael Mack, male    DOB: September 22, 1960, 59 y.o.   MRN: [id number]  HPI  Pt is a 59 year old male presenting for a telephonic follow-up of diabetes. Pt reports he checks his blood sugar 3-4 times a day - before breakfast, around lunch time, after dinner, and before bed. Pt declines keeping a record of blood sugar readings but recalls that his reading this morning was around 120 and last night it was 90.   Review of Systems Patient Active Problem List   Diagnosis Date Noted  . Hepatic encephalopathy ([place]) 07/13/2018  . Thrombocytopenia ([place]) 09/11/2015  . Cirrhosis ([place]) 09/11/2015  . Hepatitis C 04/18/2015  . Diabetes ([place]) 01/01/2015  . GERD (gastroesophageal reflux disease) 01/01/2015  . Hypertension 01/01/2015  . Fatty liver 01/01/2015   Allergies as of 09/06/2019   No Known Allergies     Medication List       Accurate as of September 06, 2019  9:49 AM. If you have any questions, ask your nurse or doctor.        acetaminophen 500 MG tablet Commonly known as: TYLENOL Take 500 mg by mouth as needed.   ALPRAZolam 1 MG tablet Commonly known as: XANAX Take 1 mg by mouth 3 (three) times daily.   amitriptyline 50 MG tablet Commonly known as: ELAVIL Take 50 mg by mouth at bedtime. Uses for sleep   guaifenesin 100 MG/5ML syrup Commonly known as: ROBITUSSIN Take 5 mLs (100 mg total) by mouth 3 (three) times daily as needed for cough.   hydrochlorothiazide 25 MG tablet Commonly known as: HYDRODIURIL TAKE ONE TABLET BY MOUTH EVERY DAY   ibuprofen 600 MG tablet Commonly known as: ADVIL Take 1 tablet (600 mg total) by mouth every 8 (eight) hours as needed.   insulin glargine 100 UNIT/ML Solostar Pen Commonly known as: Lantus SoloStar INJECT 50 UNITS UNDER THE SKIN AT BEDTIME   insulin glulisine 100 UNIT/ML injection Commonly known as: APIDRA Inject 0.35 mLs (35 Units total) into the skin 3 (three) times daily with meals.   insulin lispro 100  UNIT/ML injection Commonly known as: HumaLOG Inject 35 units tid with meals   lactulose 10 GM/15ML solution Commonly known as: CHRONULAC Take by mouth 3 (three) times daily.   lactulose 10 GM/15ML solution Commonly known as: CHRONULAC Take by mouth.   lactulose 10 GM/15ML solution Commonly known as: CHRONULAC TAKE 30MLS BY MOUTH 3 TIMES A DAY   lisinopril 40 MG tablet Commonly known as: ZESTRIL Take 1 tablet (40 mg total) by mouth daily.   loratadine 10 MG tablet Commonly known as: CLARITIN TAKE ONE TABLET BY MOUTH EVERY DAY   methadone 10 MG tablet Commonly known as: DOLOPHINE Take 10 mg by mouth daily.   oxycodone 30 MG immediate release tablet Commonly known as: ROXICODONE Take 30 mg by mouth 2 (two) times daily. This is prescribed by his pain management team.   ranitidine 150 MG tablet Commonly known as: ZANTAC Take 1 tablet (150 mg total) by mouth daily.   Xifaxan 550 MG Tabs tablet Generic drug: rifaximin Take by mouth.          Objective:   Physical Exam  No PE - telephonic visit.      Assessment & Plan:  1. Type 2 diabetes mellitus without complication, with long-term current use of insulin (HCC) A1c is elevated. He has had a flu syndrome in the past few weeks. His morning blood sugars and  evening blood sugars have been reported under 120 the past 24 hours, self-monitored. Cautioned him to be more careful with his diet. No medication changes.  - Comp Met (CMET); Future - CBC; Future - Protime-INR; Future - CEA; Future - HgB A1c; Future  2. Essential hypertension Appears to be stable and therapeutic.  Followup in 3 months with labs a week prior

## 2019-09-27 ENCOUNTER — Telehealth: Payer: Self-pay | Admitting: Pharmacist

## 2019-09-27 NOTE — Telephone Encounter (Signed)
09/27/2019 3:51:09 PM - Call to [facility] on Lantus & Apidra refills  -- Elmer Picker - Wednesday, September 27, 2019 3:47 PM --Received note from [place] on patient's [place] I faxed 08/25/19--Called [employer] spoke with Breanna--according to her patient has No Refills left on either medication--Can not Renew patient enrollment until after 10/10/2019. I have let Velva Harman & Truman Hayward know this information-We can sample Apidra, and patient should have enough Lantus per RP. I have put renewal application for each med in bag with meds for patient to sign, also sent provider portion to [facility] for Benjamine Mola to sign and return.

## 2019-09-28 ENCOUNTER — Other Ambulatory Visit: Payer: Self-pay | Admitting: Gerontology

## 2019-09-28 DIAGNOSIS — Z794 Long term (current) use of insulin: Secondary | ICD-10-CM

## 2019-09-28 DIAGNOSIS — E119 Type 2 diabetes mellitus without complications: Secondary | ICD-10-CM

## 2019-10-06 ENCOUNTER — Other Ambulatory Visit: Payer: Self-pay | Admitting: Internal Medicine

## 2019-10-06 DIAGNOSIS — I10 Essential (primary) hypertension: Secondary | ICD-10-CM

## 2019-10-11 ENCOUNTER — Telehealth: Payer: Self-pay | Admitting: Pharmacist

## 2019-10-11 NOTE — Telephone Encounter (Signed)
10/11/2019 8:22:09 AM - Apidra Solostar renewal to [place] - Wednesday, October 11, 2019 8:21 AM --Michael Mack [employer] renewal for [name] 35 units three times daily #6.   10/11/2019 8:21:10 AM - Lantus Solostar renewal to [place] - Wednesday, October 11, 2019 8:20 AM -- Michael Mack [employer] renewal for Lantus Solostar Inject 50 units daily #3.

## 2019-10-23 ENCOUNTER — Telehealth: Payer: Self-pay | Admitting: Pharmacy Technician

## 2019-10-23 NOTE — Telephone Encounter (Signed)
Received updated proof of income.  Patient eligible to receive medication assistance at [facility] until time for re-certification in 9359, and as long as eligibility requirements continue to be met.  [place] [facility]

## 2019-10-30 ENCOUNTER — Telehealth: Payer: Self-pay | Admitting: Pharmacist

## 2019-10-30 NOTE — Telephone Encounter (Signed)
10/30/2019 12:35:59 PM - Xifaxan renewal to [place] - Monday, Oct 30, 2019 12:35 PM --Colin Broach application for renewal on Xifaxan 550mg  Take 1 tablet by mouth twice daily # 180.

## 2019-11-29 ENCOUNTER — Other Ambulatory Visit: Payer: Self-pay

## 2019-11-29 DIAGNOSIS — Z794 Long term (current) use of insulin: Secondary | ICD-10-CM

## 2019-11-30 LAB — PROTIME-INR
INR: 1.1 (ref 0.9–1.2)
Prothrombin Time: 11.6 s (ref 9.1–12.0)

## 2019-11-30 LAB — HEMOGLOBIN A1C
Est. average glucose Bld gHb Est-mCnc: 163 mg/dL
Hgb A1c MFr Bld: 7.3 % — ABNORMAL HIGH (ref 4.8–5.6)

## 2019-11-30 LAB — COMPREHENSIVE METABOLIC PANEL
ALT: 22 IU/L (ref 0–44)
AST: 25 IU/L (ref 0–40)
Albumin/Globulin Ratio: 1.3 (ref 1.2–2.2)
Albumin: 3.7 g/dL — ABNORMAL LOW (ref 3.8–4.9)
Alkaline Phosphatase: 76 IU/L (ref 48–121)
BUN/Creatinine Ratio: 21 — ABNORMAL HIGH (ref 9–20)
BUN: 15 mg/dL (ref 6–24)
Bilirubin Total: 0.3 mg/dL (ref 0.0–1.2)
CO2: 24 mmol/L (ref 20–29)
Calcium: 9 mg/dL (ref 8.7–10.2)
Chloride: 99 mmol/L (ref 96–106)
Creatinine, Ser: 0.72 mg/dL — ABNORMAL LOW (ref 0.76–1.27)
GFR calc Af Amer: 119 mL/min/{1.73_m2} (ref >59)
GFR calc non Af Amer: 103 mL/min/{1.73_m2} (ref >59)
Globulin, Total: 2.9 g/dL (ref 1.5–4.5)
Glucose: 288 mg/dL — ABNORMAL HIGH (ref 65–99)
Potassium: 4.3 mmol/L (ref 3.5–5.2)
Sodium: 138 mmol/L (ref 134–144)
Total Protein: 6.6 g/dL (ref 6.0–8.5)

## 2019-11-30 LAB — CBC
Hematocrit: 40 % (ref 37.5–51.0)
Hemoglobin: 13.1 g/dL (ref 13.0–17.7)
MCH: 30.3 pg (ref 26.6–33.0)
MCHC: 32.8 g/dL (ref 31.5–35.7)
MCV: 92 fL (ref 79–97)
Platelets: 72 10*3/uL — CL (ref 150–450)
RBC: 4.33 x10E6/uL (ref 4.14–5.80)
RDW: 14.2 % (ref 11.6–15.4)
WBC: 4 10*3/uL (ref 3.4–10.8)

## 2019-11-30 LAB — CEA: CEA: 7.8 ng/mL — ABNORMAL HIGH (ref 0.0–4.7)

## 2019-12-02 ENCOUNTER — Inpatient Hospital Stay (HOSPITAL_COMMUNITY)
Admit: 2019-12-02 | Discharge: 2019-12-02 | Disposition: A | Discharge status: Home / Self Care | Payer: Self-pay | Attending: Pulmonary Disease | Admitting: Pulmonary Disease

## 2019-12-02 ENCOUNTER — Emergency Department: Payer: Self-pay

## 2019-12-02 ENCOUNTER — Other Ambulatory Visit: Payer: Self-pay

## 2019-12-02 ENCOUNTER — Inpatient Hospital Stay: Payer: Self-pay

## 2019-12-02 ENCOUNTER — Inpatient Hospital Stay
Admission: AD | Admit: 2019-12-02 | Discharge: 2019-12-07 | DRG: 917 | Disposition: A | Discharge status: Home / Self Care | Payer: Self-pay | Attending: Internal Medicine | Admitting: Internal Medicine

## 2019-12-02 DIAGNOSIS — I5031 Acute diastolic (congestive) heart failure: Secondary | ICD-10-CM

## 2019-12-02 DIAGNOSIS — K219 Gastro-esophageal reflux disease without esophagitis: Secondary | ICD-10-CM | POA: Diagnosis present

## 2019-12-02 DIAGNOSIS — J9602 Acute respiratory failure with hypercapnia: Secondary | ICD-10-CM | POA: Diagnosis present

## 2019-12-02 DIAGNOSIS — K7030 Alcoholic cirrhosis of liver without ascites: Secondary | ICD-10-CM | POA: Diagnosis present

## 2019-12-02 DIAGNOSIS — K7460 Unspecified cirrhosis of liver: Secondary | ICD-10-CM | POA: Diagnosis present

## 2019-12-02 DIAGNOSIS — G92 Toxic encephalopathy: Secondary | ICD-10-CM | POA: Diagnosis present

## 2019-12-02 DIAGNOSIS — R4182 Altered mental status, unspecified: Secondary | ICD-10-CM | POA: Insufficient documentation

## 2019-12-02 DIAGNOSIS — G9341 Metabolic encephalopathy: Secondary | ICD-10-CM | POA: Diagnosis present

## 2019-12-02 DIAGNOSIS — J690 Pneumonitis due to inhalation of food and vomit: Secondary | ICD-10-CM | POA: Diagnosis present

## 2019-12-02 DIAGNOSIS — E119 Type 2 diabetes mellitus without complications: Secondary | ICD-10-CM

## 2019-12-02 DIAGNOSIS — Z79899 Other long term (current) drug therapy: Secondary | ICD-10-CM

## 2019-12-02 DIAGNOSIS — E1165 Type 2 diabetes mellitus with hyperglycemia: Secondary | ICD-10-CM | POA: Diagnosis present

## 2019-12-02 DIAGNOSIS — K7040 Alcoholic hepatic failure without coma: Secondary | ICD-10-CM | POA: Diagnosis present

## 2019-12-02 DIAGNOSIS — I10 Essential (primary) hypertension: Secondary | ICD-10-CM

## 2019-12-02 DIAGNOSIS — Z6839 Body mass index (BMI) 39.0-39.9, adult: Secondary | ICD-10-CM

## 2019-12-02 DIAGNOSIS — J9601 Acute respiratory failure with hypoxia: Secondary | ICD-10-CM | POA: Diagnosis present

## 2019-12-02 DIAGNOSIS — F1120 Opioid dependence, uncomplicated: Secondary | ICD-10-CM | POA: Diagnosis present

## 2019-12-02 DIAGNOSIS — Z4659 Encounter for fitting and adjustment of other gastrointestinal appliance and device: Secondary | ICD-10-CM

## 2019-12-02 DIAGNOSIS — K7290 Hepatic failure, unspecified without coma: Secondary | ICD-10-CM

## 2019-12-02 DIAGNOSIS — R410 Disorientation, unspecified: Secondary | ICD-10-CM

## 2019-12-02 DIAGNOSIS — I110 Hypertensive heart disease with heart failure: Secondary | ICD-10-CM | POA: Diagnosis present

## 2019-12-02 DIAGNOSIS — Z20822 Contact with and (suspected) exposure to covid-19: Secondary | ICD-10-CM | POA: Diagnosis present

## 2019-12-02 DIAGNOSIS — Z794 Long term (current) use of insulin: Secondary | ICD-10-CM

## 2019-12-02 DIAGNOSIS — G8929 Other chronic pain: Secondary | ICD-10-CM | POA: Diagnosis present

## 2019-12-02 DIAGNOSIS — T17800A Unspecified foreign body in other parts of respiratory tract causing asphyxiation, initial encounter: Secondary | ICD-10-CM | POA: Diagnosis present

## 2019-12-02 DIAGNOSIS — J9600 Acute respiratory failure, unspecified whether with hypoxia or hypercapnia: Secondary | ICD-10-CM

## 2019-12-02 DIAGNOSIS — N179 Acute kidney failure, unspecified: Secondary | ICD-10-CM | POA: Diagnosis present

## 2019-12-02 DIAGNOSIS — T424X1A Poisoning by benzodiazepines, accidental (unintentional), initial encounter: Principal | ICD-10-CM | POA: Diagnosis present

## 2019-12-02 DIAGNOSIS — R401 Stupor: Secondary | ICD-10-CM

## 2019-12-02 LAB — BLOOD GAS, ARTERIAL
Acid-Base Excess: 0.5 mmol/L (ref 0.0–2.0)
Bicarbonate: 27 mmol/L (ref 20.0–28.0)
FIO2: 0.95
MECHVT: 500 mL
Mechanical Rate: 20
O2 Saturation: 99.7 %
PEEP: 5 cmH2O
Patient temperature: 37
RATE: 20 {breaths}/min
pCO2 arterial: 50 mmHg — ABNORMAL HIGH (ref 32.0–48.0)
pH, Arterial: 7.34 — ABNORMAL LOW (ref 7.350–7.450)
pO2, Arterial: 208 mmHg — ABNORMAL HIGH (ref 83.0–108.0)

## 2019-12-02 LAB — URINALYSIS, ROUTINE W REFLEX MICROSCOPIC
Bacteria, UA: NONE SEEN
Bilirubin Urine: NEGATIVE
Glucose, UA: >=500 mg/dL — AB
Hgb urine dipstick: NEGATIVE
Ketones, ur: NEGATIVE mg/dL
Leukocytes,Ua: NEGATIVE
Nitrite: NEGATIVE
Protein, ur: 30 mg/dL — AB
Specific Gravity, Urine: 1.016 (ref 1.005–1.030)
Squamous Epithelial / HPF: NONE SEEN (ref 0–5)
pH: 5 (ref 5.0–8.0)

## 2019-12-02 LAB — CREATININE, SERUM
Creatinine, Ser: 1.82 mg/dL — ABNORMAL HIGH (ref 0.61–1.24)
GFR calc Af Amer: 46 mL/min — ABNORMAL LOW (ref >60)
GFR calc non Af Amer: 40 mL/min — ABNORMAL LOW (ref >60)

## 2019-12-02 LAB — CBC WITH DIFFERENTIAL/PLATELET
Abs Immature Granulocytes: 0.05 10*3/uL (ref 0.00–0.07)
Basophils Absolute: 0 10*3/uL (ref 0.0–0.1)
Basophils Relative: 0 %
Eosinophils Absolute: 0 10*3/uL (ref 0.0–0.5)
Eosinophils Relative: 0 %
HCT: 42.2 % (ref 39.0–52.0)
Hemoglobin: 14.6 g/dL (ref 13.0–17.0)
Immature Granulocytes: 1 %
Lymphocytes Relative: 8 %
Lymphs Abs: 0.8 10*3/uL (ref 0.7–4.0)
MCH: 30.9 pg (ref 26.0–34.0)
MCHC: 34.6 g/dL (ref 30.0–36.0)
MCV: 89.4 fL (ref 80.0–100.0)
Monocytes Absolute: 0.4 10*3/uL (ref 0.1–1.0)
Monocytes Relative: 4 %
Neutro Abs: 9.5 10*3/uL — ABNORMAL HIGH (ref 1.7–7.7)
Neutrophils Relative %: 87 %
Platelets: 123 10*3/uL — ABNORMAL LOW (ref 150–400)
RBC: 4.72 MIL/uL (ref 4.22–5.81)
RDW: 14.2 % (ref 11.5–15.5)
WBC: 10.9 10*3/uL — ABNORMAL HIGH (ref 4.0–10.5)
nRBC: 0 % (ref 0.0–0.2)

## 2019-12-02 LAB — COMPREHENSIVE METABOLIC PANEL WITH GFR
ALT: 62 U/L — ABNORMAL HIGH (ref 0–44)
AST: 88 U/L — ABNORMAL HIGH (ref 15–41)
Albumin: 4 g/dL (ref 3.5–5.0)
Alkaline Phosphatase: 71 U/L (ref 38–126)
Anion gap: 14 (ref 5–15)
BUN: 27 mg/dL — ABNORMAL HIGH (ref 6–20)
CO2: 22 mmol/L (ref 22–32)
Calcium: 9.4 mg/dL (ref 8.9–10.3)
Chloride: 100 mmol/L (ref 98–111)
Creatinine, Ser: 1.86 mg/dL — ABNORMAL HIGH (ref 0.61–1.24)
GFR calc Af Amer: 45 mL/min — ABNORMAL LOW
GFR calc non Af Amer: 39 mL/min — ABNORMAL LOW
Glucose, Bld: 359 mg/dL — ABNORMAL HIGH (ref 70–99)
Potassium: 4.4 mmol/L (ref 3.5–5.1)
Sodium: 136 mmol/L (ref 135–145)
Total Bilirubin: 0.9 mg/dL (ref 0.3–1.2)
Total Protein: 7.7 g/dL (ref 6.5–8.1)

## 2019-12-02 LAB — APTT: aPTT: 30 s (ref 24–36)

## 2019-12-02 LAB — CBC
HCT: 38.1 % — ABNORMAL LOW (ref 39.0–52.0)
Hemoglobin: 12.8 g/dL — ABNORMAL LOW (ref 13.0–17.0)
MCH: 30.7 pg (ref 26.0–34.0)
MCHC: 33.6 g/dL (ref 30.0–36.0)
MCV: 91.4 fL (ref 80.0–100.0)
Platelets: 86 10*3/uL — ABNORMAL LOW (ref 150–400)
RBC: 4.17 MIL/uL — ABNORMAL LOW (ref 4.22–5.81)
RDW: 14.5 % (ref 11.5–15.5)
WBC: 5.9 10*3/uL (ref 4.0–10.5)
nRBC: 0 % (ref 0.0–0.2)

## 2019-12-02 LAB — URINE DRUG SCREEN, QUALITATIVE (ARMC ONLY)
Amphetamines, Ur Screen: NOT DETECTED
Barbiturates, Ur Screen: NOT DETECTED
Benzodiazepine, Ur Scrn: POSITIVE — AB
Cannabinoid 50 Ng, Ur ~~LOC~~: NOT DETECTED
Cocaine Metabolite,Ur ~~LOC~~: NOT DETECTED
MDMA (Ecstasy)Ur Screen: NOT DETECTED
Methadone Scn, Ur: POSITIVE — AB
Opiate, Ur Screen: POSITIVE — AB
Phencyclidine (PCP) Ur S: NOT DETECTED
Tricyclic, Ur Screen: POSITIVE — AB

## 2019-12-02 LAB — TRIGLYCERIDES: Triglycerides: 127 mg/dL (ref <150)

## 2019-12-02 LAB — PROTIME-INR
INR: 1.3 — ABNORMAL HIGH (ref 0.8–1.2)
Prothrombin Time: 15.5 seconds — ABNORMAL HIGH (ref 11.4–15.2)

## 2019-12-02 LAB — GLUCOSE, CAPILLARY
Glucose-Capillary: 272 mg/dL — ABNORMAL HIGH (ref 70–99)
Glucose-Capillary: 309 mg/dL — ABNORMAL HIGH (ref 70–99)
Glucose-Capillary: 250 mg/dL — ABNORMAL HIGH (ref 70–99)
Glucose-Capillary: 187 mg/dL — ABNORMAL HIGH (ref 70–99)

## 2019-12-02 LAB — ETHANOL: Alcohol, Ethyl (B): <10 mg/dL (ref <10)

## 2019-12-02 LAB — SARS CORONAVIRUS 2 BY RT PCR (HOSPITAL ORDER, PERFORMED IN ~~LOC~~ HOSPITAL LAB): SARS Coronavirus 2: NEGATIVE

## 2019-12-02 LAB — HIV ANTIBODY (ROUTINE TESTING W REFLEX): HIV Screen 4th Generation wRfx: NONREACTIVE

## 2019-12-02 LAB — MRSA PCR SCREENING: MRSA by PCR: POSITIVE — AB

## 2019-12-02 LAB — LACTIC ACID, PLASMA
Lactic Acid, Venous: 2.5 mmol/L (ref 0.5–1.9)
Lactic Acid, Venous: 1.8 mmol/L (ref 0.5–1.9)

## 2019-12-02 LAB — AMMONIA: Ammonia: 23 umol/L (ref 9–35)

## 2019-12-02 MED ORDER — SODIUM CHLORIDE 0.9 % IV SOLN
3.0000 g | Freq: Four times a day (QID) | INTRAVENOUS | Status: DC
  Administered 2019-12-02 – 2019-12-05 (×11): 3 g via INTRAVENOUS
  Filled 2019-12-02: qty 3
  Filled 2019-12-02: qty 8
  Filled 2019-12-02 (×5): qty 3
  Filled 2019-12-02 (×4): qty 8
  Filled 2019-12-02: qty 3
  Filled 2019-12-02: qty 8
  Filled 2019-12-02 (×2): qty 3

## 2019-12-02 MED ORDER — CHLORHEXIDINE GLUCONATE 0.12% ORAL RINSE (MEDLINE KIT)
15.0000 mL | Freq: Two times a day (BID) | OROMUCOSAL | Status: DC
  Administered 2019-12-02 – 2019-12-04 (×7): 15 mL via OROMUCOSAL

## 2019-12-02 MED ORDER — DOCUSATE SODIUM 50 MG/5ML PO LIQD: 100.0000 mg | Freq: Two times a day (BID) | ORAL | Status: DC

## 2019-12-02 MED ORDER — ETOMIDATE 2 MG/ML IV SOLN
INTRAVENOUS | Status: AC | PRN
  Administered 2019-12-02: 30 mg via INTRAVENOUS

## 2019-12-02 MED ORDER — FENTANYL 2500MCG IN NS 250ML (10MCG/ML) PREMIX INFUSION
25.0000 ug/h | INTRAVENOUS | Status: DC
  Administered 2019-12-02: 25 ug/h via INTRAVENOUS
  Administered 2019-12-04: 200 ug/h via INTRAVENOUS
  Filled 2019-12-02 (×2): qty 250

## 2019-12-02 MED ORDER — MUPIROCIN 2 % EX OINT
1.0000 "application " | TOPICAL_OINTMENT | Freq: Two times a day (BID) | CUTANEOUS | Status: DC
  Administered 2019-12-02 – 2019-12-06 (×9): 1 via NASAL
  Filled 2019-12-02: qty 22

## 2019-12-02 MED ORDER — DOCUSATE SODIUM 100 MG PO CAPS: 100.0000 mg | ORAL_CAPSULE | Freq: Two times a day (BID) | ORAL | Status: DC | PRN

## 2019-12-02 MED ORDER — LACTATED RINGERS IV SOLN
INTRAVENOUS | Status: DC
  Administered 2019-12-03 – 2019-12-05 (×2): 75 mL/h via INTRAVENOUS

## 2019-12-02 MED ORDER — FENTANYL BOLUS VIA INFUSION
25.0000 ug | INTRAVENOUS | Status: DC | PRN
  Filled 2019-12-02: qty 25

## 2019-12-02 MED ORDER — SODIUM CHLORIDE 0.9 % IV SOLN
2.0000 g | Freq: Once | INTRAVENOUS | Status: AC
  Administered 2019-12-02: 2 g via INTRAVENOUS
  Filled 2019-12-02: qty 2

## 2019-12-02 MED ORDER — FENTANYL CITRATE (PF) 100 MCG/2ML IJ SOLN
25.0000 ug | Freq: Once | INTRAMUSCULAR | Status: AC
  Administered 2019-12-02: 25 ug via INTRAVENOUS
  Filled 2019-12-02: qty 2

## 2019-12-02 MED ORDER — SODIUM CHLORIDE 0.9 % IV BOLUS
900.0000 mL | Freq: Once | INTRAVENOUS | Status: AC
  Administered 2019-12-02: 900 mL via INTRAVENOUS

## 2019-12-02 MED ORDER — CHLORHEXIDINE GLUCONATE CLOTH 2 % EX PADS
6.0000 | MEDICATED_PAD | Freq: Every day | CUTANEOUS | Status: DC
  Administered 2019-12-02: 6 via TOPICAL

## 2019-12-02 MED ORDER — SUCCINYLCHOLINE CHLORIDE 20 MG/ML IJ SOLN
INTRAMUSCULAR | Status: AC | PRN
  Administered 2019-12-02: 120 mg via INTRAVENOUS

## 2019-12-02 MED ORDER — DOCUSATE SODIUM 50 MG/5ML PO LIQD
100.0000 mg | Freq: Two times a day (BID) | ORAL | Status: DC
  Administered 2019-12-02 – 2019-12-04 (×4): 100 mg
  Filled 2019-12-02 (×4): qty 10

## 2019-12-02 MED ORDER — POLYETHYLENE GLYCOL 3350 17 G PO PACK: 17.0000 g | PACK | Freq: Every day | ORAL | Status: DC | PRN

## 2019-12-02 MED ORDER — SODIUM CHLORIDE 0.9 % IV BOLUS
1000.0000 mL | Freq: Once | INTRAVENOUS | Status: AC
  Administered 2019-12-02: 1000 mL via INTRAVENOUS

## 2019-12-02 MED ORDER — PERFLUTREN LIPID MICROSPHERE
1.0000 mL | INTRAVENOUS | Status: AC | PRN
  Administered 2019-12-02: 4 mL via INTRAVENOUS
  Filled 2019-12-02: qty 10

## 2019-12-02 MED ORDER — POLYETHYLENE GLYCOL 3350 17 G PO PACK
17.0000 g | PACK | Freq: Every day | ORAL | Status: DC
  Administered 2019-12-03 – 2019-12-04 (×2): 17 g
  Filled 2019-12-02 (×2): qty 1

## 2019-12-02 MED ORDER — FENTANYL CITRATE (PF) 100 MCG/2ML IJ SOLN
INTRAMUSCULAR | Status: AC | PRN
  Administered 2019-12-02 (×2): 100 ug via INTRAVENOUS

## 2019-12-02 MED ORDER — PROPOFOL 1000 MG/100ML IV EMUL
5.0000 ug/kg/min | INTRAVENOUS | Status: DC
  Administered 2019-12-02: 25 ug/kg/min via INTRAVENOUS
  Administered 2019-12-02: 10 ug/kg/min via INTRAVENOUS
  Administered 2019-12-02: 40 ug/kg/min via INTRAVENOUS
  Administered 2019-12-02: 20 ug/kg/min via INTRAVENOUS
  Administered 2019-12-02: 40 ug/kg/min via INTRAVENOUS
  Administered 2019-12-03 (×2): 20 ug/kg/min via INTRAVENOUS
  Administered 2019-12-03: 30 ug/kg/min via INTRAVENOUS
  Filled 2019-12-02 (×7): qty 100

## 2019-12-02 MED ORDER — PROPOFOL 1000 MG/100ML IV EMUL
INTRAVENOUS | Status: AC
  Administered 2019-12-02: 60 ug/kg/min via INTRAVENOUS
  Filled 2019-12-02: qty 100

## 2019-12-02 MED ORDER — METRONIDAZOLE IN NACL 5-0.79 MG/ML-% IV SOLN
500.0000 mg | Freq: Once | INTRAVENOUS | Status: AC
  Administered 2019-12-02: 500 mg via INTRAVENOUS
  Filled 2019-12-02: qty 100

## 2019-12-02 MED ORDER — VANCOMYCIN HCL IN DEXTROSE 1-5 GM/200ML-% IV SOLN
1000.0000 mg | Freq: Once | INTRAVENOUS | Status: AC
  Administered 2019-12-02: 1000 mg via INTRAVENOUS
  Filled 2019-12-02: qty 200

## 2019-12-02 MED ORDER — HEPARIN SODIUM (PORCINE) 5000 UNIT/ML IJ SOLN
5000.0000 [IU] | Freq: Three times a day (TID) | INTRAMUSCULAR | Status: DC
  Administered 2019-12-02 – 2019-12-07 (×15): 5000 [IU] via SUBCUTANEOUS
  Filled 2019-12-02 (×14): qty 1

## 2019-12-02 MED ORDER — ORAL CARE MOUTH RINSE
15.0000 mL | OROMUCOSAL | Status: DC
  Administered 2019-12-02 – 2019-12-04 (×20): 15 mL via OROMUCOSAL

## 2019-12-02 MED ORDER — MIDAZOLAM HCL 5 MG/5ML IJ SOLN
INTRAMUSCULAR | Status: AC | PRN
  Administered 2019-12-02 (×2): 2 mg via INTRAVENOUS

## 2019-12-02 MED ORDER — CHLORHEXIDINE GLUCONATE CLOTH 2 % EX PADS
6.0000 | MEDICATED_PAD | Freq: Every day | CUTANEOUS | Status: DC
  Administered 2019-12-03 – 2019-12-04 (×2): 6 via TOPICAL

## 2019-12-02 MED ORDER — INSULIN ASPART 100 UNIT/ML ~~LOC~~ SOLN
0.0000 [IU] | SUBCUTANEOUS | Status: DC
  Administered 2019-12-02: 11 [IU] via SUBCUTANEOUS
  Administered 2019-12-02: 5 [IU] via SUBCUTANEOUS
  Administered 2019-12-02 – 2019-12-03 (×5): 3 [IU] via SUBCUTANEOUS
  Administered 2019-12-04: 2 [IU] via SUBCUTANEOUS
  Administered 2019-12-04 (×5): 3 [IU] via SUBCUTANEOUS
  Administered 2019-12-04: 2 [IU] via SUBCUTANEOUS
  Filled 2019-12-02 (×12): qty 1

## 2019-12-02 NOTE — ED Notes (Signed)
Pt had large amount of diarrhea in bed- pt cleansed of BM, sheets changed, and clean gown placed on pt

## 2019-12-02 NOTE — ED Triage Notes (Addendum)
Pt arrives via EMS from home after pt was found unresponsive by wife- pt has not been feeling well the last couple days- wife suspects possible OD on methadone- pt was given narcan at home and became slightly more awake but still only responsive to pain- pt was combative upon arrival and not keeping on nonrebreather- RA sat on 84% and on nonrebreather at 91%- pt axillary temp 99.8- pt has a hx of having high ammonia

## 2019-12-02 NOTE — ED Notes (Signed)
Pt to CT with this RN and respiratory

## 2019-12-02 NOTE — ED Notes (Signed)
Attempted to place 45fr temp foley but could not get it to pass

## 2019-12-02 NOTE — H&P (Signed)
Name: Michael Mack MRN: [id number] DOB: [birth year]     CONSULTATION DATE: 12/02/2019  REFERRING MD :  Dr. 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 He would intermittently moan or mutter. Once EMS arrived they found him hypoxic in the mid to low 80s, but he became agitated with NRBM and would not allow it to remain. On arrival to the ED, it was much the same. Because of his persistent hypoxia and inability to wear exogenous oxygen, he was intubated tin the ED. He had a low grade temp just under 101F. Family was suspicious that the patient overdosed on his methadone prescription, however UDS also showed benzos, which are not on the patient's home list. His wife admits that he finds  her Xanax from time to time.  PAST MEDICAL HISTORY :   has a past medical history of Bulging lumbar disc, Diabetes mellitus without complication ([place]), GERD (gastroesophageal reflux disease), Hepatitis C, Hypertension, and Migraines.  has a past surgical history that includes Knee surgery. Prior to Admission medications   Medication Sig Start Date End Date Taking? Authorizing Provider  acetaminophen (TYLENOL) 500 MG tablet Take 500 mg by mouth as needed.   Yes [provider]  amitriptyline (ELAVIL) 50 MG tablet 100 mg. TAKE 2 TABLETS BY MOUTH NIGHTLY   Yes [provider]  baclofen (LIORESAL) 10 MG tablet Take 10 mg by mouth 3 (three) times daily. 10/28/19  Yes [provider]  busPIRone (BUSPAR) 10 MG tablet Take 10 mg by mouth 3 (three) times daily as needed. 11/11/19  Yes [provider]  Cholecalciferol 25 MCG (1000 UT) tablet Take by mouth.   Yes [provider]  guaifenesin (ROBITUSSIN) 100 MG/5ML syrup Take 5 mLs (100 mg total) by mouth 3 (three) times  daily as needed for cough. 07/06/18  Yes Tawni Millers, MD  hydrochlorothiazide (HYDRODIURIL) 25 MG tablet TAKE ONE TABLET BY MOUTH EVERY DAY Patient taking differently: Take 25 mg by mouth daily.  07/11/19  Yes Tawni Millers, MD  Insulin Glargine (LANTUS SOLOSTAR) 100 UNIT/ML Solostar Pen INJECT 50 UNITS UNDER THE SKIN AT BEDTIME 05/18/18  Yes Chaplin, Dianah Field, MD  insulin glulisine (APIDRA) 100 UNIT/ML injection Inject 0.35 mLs (35 Units total) into the skin 3 (three) times daily with meals. 05/18/18  Yes Tawni Millers, MD  lactulose (CHRONULAC) 10 GM/15ML solution TAKE 30MLS BY MOUTH 3 TIMES A DAY 04/06/18  Yes Tawni Millers, MD  lisinopril (ZESTRIL) 40 MG tablet TAKE ONE TABLET BY MOUTH EVERY DAY 10/09/19  Yes Tawni Millers, MD  loratadine (CLARITIN) 10 MG tablet TAKE ONE TABLET BY MOUTH EVERY DAY 10/21/18  Yes Tawni Millers, MD  methadone (DOLOPHINE) 10 MG tablet TAKE 1 OR 2 TABLETS BY MOUTH 3 TO 4 TIMES A DAY   Yes [provider]  naloxone (NARCAN) nasal spray 4 mg/0.1 mL Place into the nose. 05/26/19  Yes [provider]  oxycodone (ROXICODONE) 30 MG immediate release tablet This is prescribed by his pain management team. TAKE ONE TABLET BY MOUTH THREE TIMES DAILY AS NEEDED FOR PAIN FOR 30 DAYS   Yes [provider]  rifaximin (XIFAXAN) 550 MG TABS tablet Take 550 mg by mouth 2 (two) times daily.    Yes  [provider]  albuterol (VENTOLIN HFA) 108 (90 Base) MCG/ACT inhaler SMARTSIG:2 Puff(s) By Mouth Every 4-6 Hours PRN 08/14/19   [provider]   No Active Allergies  FAMILY HISTORY:  family history includes Cancer in his mother. SOCIAL HISTORY:  reports that he has never smoked. He has never used smokeless tobacco. He reports that he does not drink alcohol and does not use drugs.  REVIEW OF SYSTEMS:   Unable to obtain due to critical illness      Estimated body mass index is 38.43 kg/m as calculated from the following:   Height as of  this encounter: '5\' 10"'  (1.778 m).   Weight as of this encounter: 121.5 kg.    VITAL SIGNS: Temp:  [98.4 F (36.9 C)-100.9 F (38.3 C)] 98.4 F (36.9 C) (06/12 1241) Pulse Rate:  [54-114] 73 (06/12 1500) Resp:  [19-46] 20 (06/12 1500) BP: (68-187)/(43-143) 105/70 (06/12 1500) SpO2:  [86 %-100 %] 95 % (06/12 1500) FiO2 (%):  [50 %-100 %] 50 % (06/12 1500) Weight:  [121.5 kg-130 kg] 121.5 kg (06/12 1241)   No intake/output data recorded. Total I/O In: 3032.5 [I.V.:145.5; IV [id number]:2257] Out: 5051 [Urine:475; Other:1300]   SpO2: 95 % FiO2 (%): 50 %   Physical Examination:  GENERAL:critically ill appearing, intubated HEAD: Normocephalic, atraumatic.  EYES: Pupils equal, round, reactive to light.  No scleral icterus.  MOUTH: Moist mucosal membrane. ETT NECK: Supple. No JVD.  PULMONARY: diminished at bases, no rales, scant rhonchi  CARDIOVASCULAR: S1 and S2. Regular rate and rhythm. No murmurs, rubs, or gallops.  GASTROINTESTINAL: Soft/globular, nontender, -distended.  diminished bowel sounds.  MUSCULOSKELETAL: No swelling, clubbing, or edema.  NEUROLOGIC: obtunded SKIN:intact,warm,dry   MEDICATIONS: I have reviewed all medications and confirmed regimen as documented   CULTURE RESULTS   Recent Results (from the past 240 hour(s))  SARS Coronavirus 2 by RT PCR (hospital order, performed in [facility] hospital lab) Nasopharyngeal Nasopharyngeal Swab     Status: None   Collection Time: 12/02/19  8:22 AM   Specimen: Nasopharyngeal Swab  Result Value Ref Range Status   SARS Coronavirus 2 NEGATIVE NEGATIVE Final    Comment: (NOTE) SARS-CoV-2 target nucleic acids are NOT DETECTED.  The SARS-CoV-2 RNA is generally detectable in upper and lower respiratory specimens during the acute phase of infection. The lowest concentration of SARS-CoV-2 viral copies this assay can detect is 250 copies / mL. A negative result does not preclude SARS-CoV-2 infection and should not be  used as the sole basis for treatment or other patient management decisions.  A negative result may occur with improper specimen collection / handling, submission of specimen other than nasopharyngeal swab, presence of viral mutation(s) within the areas targeted by this assay, and inadequate number of viral copies (<250 copies / mL). A negative result must be combined with clinical observations, patient history, and epidemiological information.  Fact Sheet for Patients:   StrictlyIdeas.no  Fact Sheet for Healthcare Providers: BankingDealers.co.za  This test is not yet approved or  cleared by the [place] FDA and has been authorized for detection and/or diagnosis of SARS-CoV-2 by FDA under an Emergency Use Authorization (EUA).  This EUA will remain in effect (meaning this test can be used) for the duration of the COVID-19 declaration under Section 564(b)(1) of the Act, 21 U.S.C. section 360bbb-3(b)(1), unless the authorization is terminated or revoked sooner.  Performed at [facility], [address], [place], [place] [address]   MRSA PCR Screening  Status: Abnormal   Collection Time: 12/02/19 12:42 PM   Specimen: Nasopharyngeal  Result Value Ref Range Status   MRSA by PCR POSITIVE (A) NEGATIVE Final    Comment:        The GeneXpert MRSA Assay (FDA approved for NASAL specimens only), is one component of a comprehensive MRSA colonization surveillance program. It is not intended to diagnose MRSA infection nor to guide or monitor treatment for MRSA infections. RESULT CALLED TO, READ BACK BY AND VERIFIED WITH: [facility] MOORE AT 7425 12/02/19.PMF Performed at [facility], [address] [place], [place] [address]           IMAGING    CT Head Wo Contrast  Result Date: 12/02/2019 CLINICAL DATA:  Altered mental status. Found unresponsive by wife. Potential overdose. EXAM: CT HEAD WITHOUT CONTRAST  TECHNIQUE: Contiguous axial images were obtained from the base of the skull through the vertex without intravenous contrast. COMPARISON:  Chest radiograph-earlier same day FINDINGS: Brain: Gray-white differentiation is maintained. No CT evidence of acute large territory infarct. No intraparenchymal or extra-axial mass or hemorrhage. Normal size and configuration of the ventricles and the basilar cisterns. Vascular: Intracranial atherosclerosis. Skull: No displaced calvarial fracture. Sinuses/Orbits: Scattered opacification of the right bilateral posterior ethmoidal air cells, right greater than left, likely the sequela of intubation. Mucosal thickening involving the right maxillary sinus. Rightward nasal septal deviation. Mastoid air cells appear well aerated. Other: Regional soft tissues appear normal. IMPRESSION: No acute intracranial process. Electronically Signed   By: Sandi Mariscal M.D.   On: 12/02/2019 09:57   DG Chest Portable 1 View  Result Date: 12/02/2019 CLINICAL DATA:  Hypoxia EXAM: PORTABLE CHEST 1 VIEW COMPARISON:  July 06, 2018 FINDINGS: Endotracheal tube tip is 2.3 cm above the carina. Nasogastric tube tip and side port are below the diaphragm. No pneumothorax. Mild elevation of the right hemidiaphragm is stable. The lungs are clear. The heart is upper normal in size with pulmonary vascularity normal. No adenopathy. No bone lesions. There is aortic atherosclerosis. IMPRESSION: Tube positions as described without pneumothorax. Mild elevation of right hemidiaphragm is stable. Lungs are clear. Heart upper normal in size. Aortic Atherosclerosis (ICD10-I70.0). Electronically Signed   By: Lowella Grip III M.D.   On: 12/02/2019 09:05     Nutrition Status:           Indwelling Urinary Catheter continued, requirement due to   Reason to continue Indwelling Urinary Catheter strict Intake/Output monitoring for hemodynamic instability         Ventilator continued, requirement due to  severe respiratory failure   Ventilator Sedation RASS 0 to -2      ASSESSMENT AND PLAN SYNOPSIS   Severe ACUTE Hypoxic and Hypercapnic Respiratory Failure -suspect chronic aspiration -continue Full MV support -continue Bronchodilator Therapy -Wean Fio2 and PEEP as tolerated -will perform SAT/SBT when respiratory parameters are met -VAP/VENT bundle implementation  Morbid obesity, possible OSA.   Will certainly impact respiratory mechanics, ventilator weaning Suspect will need to consider additional PEEP, possible extubation to BiPAP when appropriate to consider   ACUTE DIASTOLIC CARDIAC FAILURE?  -echo pending -maintain afterload reduction and rate control   ACUTE KIDNEY INJURY/Renal Failure -creatinine baseline <1, presently 1.82 -continue Foley Catheter-assess need -Avoid nephrotoxic agents -Follow urine output, BMP -Ensure adequate renal perfusion, optimize oxygenation -Renal dose medications   NEUROLOGY - intubated and sedated - minimal sedation to achieve a RASS goal: -1 -treat NH4, allow methadone and xanax to clear   Acute toxic metabolic encephalopathy, need  for sedation Goal RASS -2 to -3   CARDIAC ICU monitoring 2D echo pending  ID -cefepime, vanc, flagyl up front -de-escalate coverage as possible -follow up cultures  GI GI PROPHYLAXIS as indicated   ENDO - will use ICU hypoglycemic\Hyperglycemia protocol if needed  ELECTROLYTES -follow labs as needed -replace as needed -pharmacy consultation and following  DVT/GI PRX ordered and assessed TRANSFUSIONS AS NEEDED MONITOR FSBS I Assessed the need for Labs I Assessed the need for Foley I Assessed the need for Central Venous Line Family Discussion with patient's wife at the bedside I Assessed the need for Mobilization I made an Assessment of medications to be adjusted accordingly Safety Risk assessment Completed     Critical Care Time devoted to patient care services described in  this note is 8mnutes.   Overall, patient is critically ill, prognosis is guarded.    //Braxtyn Bojarski

## 2019-12-02 NOTE — ED Provider Notes (Signed)
[facility] Emergency Department Provider Note  Time seen: 8:22 AM  I have reviewed the triage vital signs and the nursing notes.   HISTORY  Chief Complaint Altered Mental Status   HPI Michael Mack is a 59 y.o. male with a past medical history of diabetes, gastric reflux, hepatitis C, hypertension, methadone use, presents to the emergency department for unresponsiveness.  According EMS report family called for EMS this morning after finding the patient largely unresponsive.  Patient will occasionally moan turn, and occasionally make a purposeful movement but appears quite confused.  Upon arrival patient does react to sternal rub, but otherwise he is quite tachypneic, satting in the 80s on room air.  Attempting to put a nonrebreather on but very difficult as the patient is thrashing around pulling the nonrebreather off.  Patient is not redirectable as not reasonable at this time as he is quite confused.  Unable to answer questions or follow commands.   Per EMS 99.8 temperature.  Past Medical History:  Diagnosis Date   Bulging lumbar disc    Diabetes mellitus without complication (HCC)    GERD (gastroesophageal reflux disease)    Hepatitis C    Hypertension    Migraines     Patient Active Problem List   Diagnosis Date Noted   Hepatic encephalopathy ([place]) 07/13/2018   Thrombocytopenia ([place]) 09/11/2015   Cirrhosis ([place]) 09/11/2015   Hepatitis C 04/18/2015   Diabetes ([place]) 01/01/2015   GERD (gastroesophageal reflux disease) 01/01/2015   Hypertension 01/01/2015   Fatty liver 01/01/2015    Past Surgical History:  Procedure Laterality Date   KNEE SURGERY     Right Knee x 3, Left Knee x 1    Prior to Admission medications   Medication Sig Start Date End Date Taking? Authorizing Provider  acetaminophen (TYLENOL) 500 MG tablet Take 500 mg by mouth as needed.    [provider]  ALPRAZolam Duanne Moron) 1 MG tablet Take 1 mg by mouth 3 (three) times  daily.    [provider]  amitriptyline (ELAVIL) 50 MG tablet Take 50 mg by mouth at bedtime. Uses for sleep    [provider]  guaifenesin (ROBITUSSIN) 100 MG/5ML syrup Take 5 mLs (100 mg total) by mouth 3 (three) times daily as needed for cough. 07/06/18   Tawni Millers, MD  hydrochlorothiazide (HYDRODIURIL) 25 MG tablet TAKE ONE TABLET BY MOUTH EVERY DAY 07/11/19   Tawni Millers, MD  ibuprofen (ADVIL,MOTRIN) 600 MG tablet Take 1 tablet (600 mg total) by mouth every 8 (eight) hours as needed. 08/19/17   Sable Feil, PA-C  Insulin Glargine (LANTUS SOLOSTAR) 100 UNIT/ML Solostar Pen INJECT 50 UNITS UNDER THE SKIN AT BEDTIME 05/18/18   Tawni Millers, MD  insulin glulisine (APIDRA) 100 UNIT/ML injection Inject 0.35 mLs (35 Units total) into the skin 3 (three) times daily with meals. 05/18/18   Tawni Millers, MD  insulin lispro (HUMALOG) 100 UNIT/ML injection Inject 35 units tid with meals 01/11/18   McGowan, Larene Beach A, PA-C  lactulose (CHRONULAC) 10 GM/15ML solution Take by mouth 3 (three) times daily.    [provider]  lactulose (CHRONULAC) 10 GM/15ML solution Take by mouth. 09/28/17   [provider]  lactulose (CHRONULAC) 10 GM/15ML solution TAKE 30MLS BY MOUTH 3 TIMES A DAY 04/06/18   Tawni Millers, MD  lisinopril (ZESTRIL) 40 MG tablet TAKE ONE TABLET BY MOUTH EVERY DAY 10/09/19   Tawni Millers, MD  loratadine (CLARITIN) 10  MG tablet TAKE ONE TABLET BY MOUTH EVERY DAY 10/21/18   Tawni Millers, MD  methadone (DOLOPHINE) 10 MG tablet Take 10 mg by mouth daily.    [provider]  oxycodone (ROXICODONE) 30 MG immediate release tablet Take 30 mg by mouth 2 (two) times daily. This is prescribed by his pain management team.    [provider]  ranitidine (ZANTAC) 150 MG tablet Take 1 tablet (150 mg total) by mouth daily. 05/18/18   Tawni Millers, MD  rifaximin (XIFAXAN) 550 MG TABS tablet Take by mouth. 09/28/17   [provider]     No Known Allergies  Family History  Problem Relation Age of Onset   Cancer Mother     Social History Social History   Tobacco Use   Smoking status: Never Smoker   Smokeless tobacco: Never Used  Substance Use Topics   Alcohol use: No   Drug use: No    Review of Systems Unable to obtain adequate/accurate review of systems secondary to altered mental status  ____________________________________________   PHYSICAL EXAM:  VITAL SIGNS: ED Triage Vitals  Enc Vitals Group     BP 12/02/19 0757 (!) 156/88     Pulse Rate 12/02/19 0757 (!) 114     Resp 12/02/19 0757 (!) 40     Temp --      Temp src --      SpO2 12/02/19 0757 97 %     Weight 12/02/19 0758 286 lb 9.6 oz (130 kg)     Height 12/02/19 0758 5\' 11"  (1.803 m)     Head Circumference --      Peak Flow --      Pain Score --      Pain Loc --      Pain Edu? --      Excl. in [place]? --    Constitutional: Patient is somnolent, does awaken at times thrashes around, attempts to pull off his oxygen mask.  Does not answer questions or follow commands. Eyes: Normal exam ENT      Head: Normocephalic and atraumatic.      Mouth/Throat: Dry appearing mucous membranes. Cardiovascular: Patient has a regular rhythm rate around 120.  No obvious murmur. Respiratory: Patient is quite tachypneic around 40 breaths/min, shallow lung sounds.  No obvious rhonchi. Gastrointestinal: Obese, no obvious distention.  Does not react to palpation. Musculoskeletal: Atraumatic appearing extremities. Neurologic: Patient is quite confused appearing.  Agitated at times, writhing around in bed at times.  Unable to follow commands or answer questions.  Is not speaking. Skin:  Skin is warm, dry.  Patient has defecated on himself. Psychiatric: Responsive to painful stimuli, writhing around in bed.  Not following commands  ____________________________________________    EKG  EKG viewed and interpreted by myself shows sinus tachycardia 113 bpm with a  slightly widened QRS, normal axis, largely normal intervals nonspecific ST changes.  ____________________________________________   INITIAL IMPRESSION / ASSESSMENT AND PLAN / ED COURSE  Pertinent labs & imaging results that were available during my care of the patient were reviewed by me and considered in my medical decision making (see chart for details).   Patient arrives to the emergency department with altered mental status, minimally responsive, confused and writhing around in bed.  Not keeping his mask on, hypoxic in the 80s.  Differential would include hepatic encephalopathy, DKA, sepsis, metabolic abnormality, infectious etiologies such as aspiration pneumonia or urinary tract infection, possible methadone overdose.  We will check labs, cultures,  cover with broad-spectrum antibiotics.  As the patient is unable to tolerate work-up, cannot leave his mask on, hypoxic, confused I have made the decision to intubate the patient for airway protection/respiratory failure and to allow [place] to do an urgent/emergent work-up.    INTUBATION Performed by: Harvest Dark  Required items: required blood products, implants, devices, and special equipment available Patient identity confirmed: provided demographic data and hospital-assigned identification number Time out: Immediately prior to procedure a "time out" was called to verify the correct patient, procedure, equipment, support staff and site/side marked as required.  Indications: Airway protection, respiratory failure  Intubation method: S4 Glidescope Laryngoscopy   Preoxygenation: 100% BVM  Sedatives: 30 mg etomidate Paralytic: 120 mg succinylcholine  Tube Size: 7.5 cuffed  Post-procedure assessment: chest rise and ETCO2 monitor Breath sounds: equal and absent over the epigastrium Tube secured with: ETT holder Chest x-ray interpreted by radiologist and me.  Chest x-ray findings: endotracheal tube in appropriate position  Patient  tolerated the procedure well with no immediate complications.     Michael Mack was evaluated in Emergency Department on 12/02/2019 for the symptoms described in the history of present illness. He was evaluated in the context of the global COVID-19 pandemic, which necessitated consideration that the patient might be at risk for infection with the SARS-CoV-2 virus that causes COVID-19. Institutional protocols and algorithms that pertain to the evaluation of patients at risk for COVID-19 are in a state of rapid change based on information released by regulatory bodies including the CDC and federal and state organizations. These policies and algorithms were followed during the patient's care in the ED.  CRITICAL CARE Performed by: Harvest Dark   Total critical care time: 60 minutes  Critical care time was exclusive of separately billable procedures and treating other patients.  Critical care was necessary to treat or prevent imminent or life-threatening deterioration.  Critical care was time spent personally by me on the following activities: development of treatment plan with patient and/or surrogate as well as nursing, discussions with consultants, evaluation of patient's response to treatment, examination of patient, obtaining history from patient or surrogate, ordering and performing treatments and interventions, ordering and review of laboratory studies, ordering and review of radiographic studies, pulse oximetry and re-evaluation of patient's condition.  ____________________________________________   FINAL CLINICAL IMPRESSION(S) / ED DIAGNOSES  Altered mental status   Harvest Dark, MD 12/07/19 [phone]

## 2019-12-02 NOTE — Progress Notes (Signed)
Patient admitted from emergency department during this shift. Wife visited at bedside and was able to speak with intensivist. No belongings at bedside besides patient's socks.

## 2019-12-02 NOTE — Consult Note (Signed)
PHARMACY -  BRIEF ANTIBIOTIC NOTE   Pharmacy has received consult(s) for vancomycin and cefepime from an ED provider. Patient is also ordered metronidazole. The patient's profile has been reviewed for ht/wt/allergies/indication/available labs.    One time order(s) placed for -Cefepime 2 g -Vancomycin 1 g  Further antibiotics/pharmacy consults should be ordered by admitting physician if indicated.                       Thank you, [place] Resident 12/02/2019  9:48 AM

## 2019-12-02 NOTE — Consult Note (Signed)
Pharmacy Antibiotic Note  Michael Mack is a 59 y.o. male admitted on 12/02/2019 with aspiration pneumonia.  Pharmacy has been consulted for Unasyn dosing. Patient received a dose of Vancomycin, Flagyl and Cefepime in ED.  Plan: Unasyn 3g IV Q6 hours  Height: 5\' 10"  (177.8 cm) Weight: 121.5 kg (267 lb 13.7 oz) IBW/kg (Calculated) : 73  Temp (24hrs), Avg:99.7 F (37.6 C), Min:98.4 F (36.9 C), Max:100.9 F (38.3 C)  Recent Labs  Lab 11/29/19 1001 12/02/19 0802 12/02/19 1026 12/02/19 1412  WBC 4.0 10.9*  --  5.9  CREATININE 0.72* 1.86*  --  1.82*  LATICACIDVEN  --  2.5* 1.8  --     Estimated Creatinine Clearance: 57.8 mL/min (A) (by C-G formula based on SCr of 1.82 mg/dL (H)).    No Active Allergies  Antimicrobials this admission: Unasyn 6/12 >>  Vancomycin/Cefepime/Flagyl 6/12 x1  Microbiology results: 6/12 BCx: pending 6/12 UCx: pending  6/12 MRSA PCR: positive  Thank you for allowing pharmacy to be a part of this patient's care.  Pearla Dubonnet 12/02/2019 7:27 PM

## 2019-12-02 NOTE — ED Notes (Signed)
Pt back to room from CT

## 2019-12-02 NOTE — Progress Notes (Signed)
Notified provider and bedside nurse of need to order fluid bolus.  Messaged sent to MD and Rn  I see where he had a period of time of low bp and map. Due to this it is recommended the patient gets full fluid resuscitation of 3921ml based on his documented weight. Would you please consider adding an additional 2995ml of fluid to meet this reccomendation.

## 2019-12-02 NOTE — ED Notes (Signed)
Multiple instances of flatus noted but no diarrhea at this time

## 2019-12-02 NOTE — Progress Notes (Signed)
*  PRELIMINARY RESULTS* Echocardiogram 2D Echocardiogram has been performed. Definity IV Contrast was used on this study.  Michael Mack 12/02/2019, 3:40 PM

## 2019-12-02 NOTE — ED Notes (Signed)
Pt intubated with positive color change

## 2019-12-02 NOTE — ED Notes (Signed)
Report given to [place], [place]

## 2019-12-03 ENCOUNTER — Inpatient Hospital Stay: Payer: Self-pay

## 2019-12-03 ENCOUNTER — Encounter: Payer: Self-pay | Admitting: Pulmonary Disease

## 2019-12-03 DIAGNOSIS — G9340 Encephalopathy, unspecified: Secondary | ICD-10-CM

## 2019-12-03 LAB — COMPREHENSIVE METABOLIC PANEL
ALT: 89 U/L — ABNORMAL HIGH (ref 0–44)
AST: 101 U/L — ABNORMAL HIGH (ref 15–41)
Albumin: 3 g/dL — ABNORMAL LOW (ref 3.5–5.0)
Alkaline Phosphatase: 54 U/L (ref 38–126)
Anion gap: 7 (ref 5–15)
BUN: 26 mg/dL — ABNORMAL HIGH (ref 6–20)
CO2: 26 mmol/L (ref 22–32)
Calcium: 8 mg/dL — ABNORMAL LOW (ref 8.9–10.3)
Chloride: 108 mmol/L (ref 98–111)
Creatinine, Ser: 1.11 mg/dL (ref 0.61–1.24)
GFR calc Af Amer: >60 mL/min (ref >60)
GFR calc non Af Amer: >60 mL/min (ref >60)
Glucose, Bld: 179 mg/dL — ABNORMAL HIGH (ref 70–99)
Potassium: 3.8 mmol/L (ref 3.5–5.1)
Sodium: 141 mmol/L (ref 135–145)
Total Bilirubin: 0.8 mg/dL (ref 0.3–1.2)
Total Protein: 6 g/dL — ABNORMAL LOW (ref 6.5–8.1)

## 2019-12-03 LAB — TRIGLYCERIDES: Triglycerides: 64 mg/dL (ref <150)

## 2019-12-03 LAB — URINE CULTURE: Culture: NO GROWTH

## 2019-12-03 LAB — GLUCOSE, CAPILLARY
Glucose-Capillary: 155 mg/dL — ABNORMAL HIGH (ref 70–99)
Glucose-Capillary: 163 mg/dL — ABNORMAL HIGH (ref 70–99)
Glucose-Capillary: 165 mg/dL — ABNORMAL HIGH (ref 70–99)
Glucose-Capillary: 161 mg/dL — ABNORMAL HIGH (ref 70–99)
Glucose-Capillary: 108 mg/dL — ABNORMAL HIGH (ref 70–99)

## 2019-12-03 LAB — CBC
HCT: 36.4 % — ABNORMAL LOW (ref 39.0–52.0)
Hemoglobin: 12.3 g/dL — ABNORMAL LOW (ref 13.0–17.0)
MCH: 30.6 pg (ref 26.0–34.0)
MCHC: 33.8 g/dL (ref 30.0–36.0)
MCV: 90.5 fL (ref 80.0–100.0)
Platelets: 75 10*3/uL — ABNORMAL LOW (ref 150–400)
RBC: 4.02 MIL/uL — ABNORMAL LOW (ref 4.22–5.81)
RDW: 14.6 % (ref 11.5–15.5)
WBC: 4.3 10*3/uL (ref 4.0–10.5)
nRBC: 0 % (ref 0.0–0.2)

## 2019-12-03 LAB — ECHOCARDIOGRAM COMPLETE
Height: 70 in
Weight: 4285.74 oz

## 2019-12-03 MED ORDER — DEXMEDETOMIDINE HCL IN NACL 400 MCG/100ML IV SOLN
0.4000 ug/kg/h | INTRAVENOUS | Status: DC
  Administered 2019-12-03: 0.4 ug/kg/h via INTRAVENOUS
  Administered 2019-12-04 (×2): 0.8 ug/kg/h via INTRAVENOUS
  Administered 2019-12-04 (×2): 1.2 ug/kg/h via INTRAVENOUS
  Filled 2019-12-03: qty 100
  Filled 2019-12-03 (×2): qty 200
  Filled 2019-12-03 (×3): qty 100

## 2019-12-03 NOTE — Progress Notes (Signed)
Name: Michael Mack MRN: [id number] DOB: 02/05/1961     CONSULTATION DATE: 12/02/2019  REFERRING MD :  Dr. Kerman Passey  CHIEF COMPLAINT:  Altered mental status  EVENTS OVERNIGHT: The patient had a relatively uneventful evening, remaining ETT/MV/Sedated  Estimated body mass index is 38.43 kg/m as calculated from the following:   Height as of this encounter: '5\' 10"'  (1.778 m).   Weight as of this encounter: 121.5 kg.    VITAL SIGNS: Temp:  [98.4 F (36.9 C)-100.9 F (38.3 C)] 98.4 F (36.9 C) (06/12 2000) Pulse Rate:  [54-110] 70 (06/13 0600) Resp:  [19-46] 20 (06/13 0600) BP: (68-187)/(43-143) 94/53 (06/13 0600) SpO2:  [86 %-100 %] 93 % (06/13 0600) FiO2 (%):  [50 %] 50 % (06/13 0806) Weight:  [121.5 kg] 121.5 kg (06/12 1241)   I/O last 3 completed shifts: In: 4422.3 [I.V.:1435.4; IV Piggyback:2986.8] Out: 2825 [Urine:1525; Other:1300] No intake/output data recorded.   SpO2: 93 % FiO2 (%): 50 %   Physical Examination:  GENERAL:critically ill appearing, intubated HEAD: Normocephalic, atraumatic.  EYES: Pupils equal, round, reactive to light.  No scleral icterus.  MOUTH: Moist mucosal membrane. ETT NECK: Supple. No JVD.  PULMONARY: diminished at bases, no rales, scant rhonchi  CARDIOVASCULAR: S1 and S2. Regular rate and rhythm. No murmurs, rubs, or gallops.  GASTROINTESTINAL: Soft/globular, nontender, -distended.  diminished bowel sounds.  MUSCULOSKELETAL: No swelling, clubbing, or edema.  NEUROLOGIC: obtunded SKIN:intact,warm,dry   MEDICATIONS: I have reviewed all medications and confirmed regimen as documented   CULTURE RESULTS   Recent Results (from the past 240 hour(s))  Blood Culture (routine x 2)     Status: None (Preliminary result)   Collection Time: 12/02/19  8:02 AM   Specimen: BLOOD  Result Value Ref Range Status   Specimen Description BLOOD BLOOD LEFT FOREARM  Final   Special Requests   Final    BOTTLES DRAWN AEROBIC AND ANAEROBIC Blood  Culture adequate volume   Culture   Final    NO GROWTH < 24 HOURS Performed at [facility], [address] [place], [place] [address]    Report Status PENDING  Incomplete  Blood Culture (routine x 2)     Status: None (Preliminary result)   Collection Time: 12/02/19  8:02 AM   Specimen: BLOOD  Result Value Ref Range Status   Specimen Description BLOOD BLOOD RIGHT HAND  Final   Special Requests   Final    BOTTLES DRAWN AEROBIC AND ANAEROBIC Blood Culture adequate volume   Culture   Final    NO GROWTH < 24 HOURS Performed at [facility], [address], [place],  [address]    Report Status PENDING  Incomplete  SARS Coronavirus 2 by RT PCR (hospital order, performed in [place] hospital lab) Nasopharyngeal Nasopharyngeal Swab     Status: None   Collection Time: 12/02/19  8:22 AM   Specimen: Nasopharyngeal Swab  Result Value Ref Range Status   SARS Coronavirus 2 NEGATIVE NEGATIVE Final    Comment: (NOTE) SARS-CoV-2 target nucleic acids are NOT DETECTED.  The SARS-CoV-2 RNA is generally detectable in upper and lower respiratory specimens during the acute phase of infection. The lowest concentration of SARS-CoV-2 viral copies this assay can detect is 250 copies / mL. A negative result does not preclude SARS-CoV-2 infection and should not be used as the sole basis for treatment or other patient management decisions.  A negative result may occur with improper specimen collection / handling, submission of specimen other  than nasopharyngeal swab, presence of viral mutation(s) within the areas targeted by this assay, and inadequate number of viral copies (<250 copies / mL). A negative result must be combined with clinical observations, patient history, and epidemiological information.  Fact Sheet for Patients:   StrictlyIdeas.no  Fact Sheet for Healthcare Providers: BankingDealers.co.za  This test is not  yet approved or  cleared by the [place] FDA and has been authorized for detection and/or diagnosis of SARS-CoV-2 by FDA under an Emergency Use Authorization (EUA).  This EUA will remain in effect (meaning this test can be used) for the duration of the COVID-19 declaration under Section 564(b)(1) of the Act, 21 U.S.C. section 360bbb-3(b)(1), unless the authorization is terminated or revoked sooner.  Performed at [facility], [address] [place], [place] [address]   MRSA PCR Screening     Status: Abnormal   Collection Time: 12/02/19 12:42 PM   Specimen: Nasopharyngeal  Result Value Ref Range Status   MRSA by PCR POSITIVE (A) NEGATIVE Final    Comment:        The GeneXpert MRSA Assay (FDA approved for NASAL specimens only), is one component of a comprehensive MRSA colonization surveillance program. It is not intended to diagnose MRSA infection nor to guide or monitor treatment for MRSA infections. RESULT CALLED TO, READ BACK BY AND VERIFIED WITH: [facility] MOORE AT 1497 12/02/19.PMF Performed at [facility], [address] [place], [place] [address]           IMAGING    CT Head Wo Contrast  Result Date: 12/02/2019 CLINICAL DATA:  Altered mental status. Found unresponsive by wife. Potential overdose. EXAM: CT HEAD WITHOUT CONTRAST TECHNIQUE: Contiguous axial images were obtained from the base of the skull through the vertex without intravenous contrast. COMPARISON:  Chest radiograph-earlier same day FINDINGS: Brain: Gray-white differentiation is maintained. No CT evidence of acute large territory infarct. No intraparenchymal or extra-axial mass or hemorrhage. Normal size and configuration of the ventricles and the basilar cisterns. Vascular: Intracranial atherosclerosis. Skull: No displaced calvarial fracture. Sinuses/Orbits: Scattered opacification of the right bilateral posterior ethmoidal air cells, right greater than left, likely the sequela of  intubation. Mucosal thickening involving the right maxillary sinus. Rightward nasal septal deviation. Mastoid air cells appear well aerated. Other: Regional soft tissues appear normal. IMPRESSION: No acute intracranial process. Electronically Signed   By: Sandi Mariscal M.D.   On: 12/02/2019 09:57   DG Chest Port 1 View  Result Date: 12/03/2019 CLINICAL DATA:  Acute respiratory failure EXAM: PORTABLE CHEST 1 VIEW COMPARISON:  12/02/2019 FINDINGS: Endotracheal tube with tip at the clavicular heads. The enteric tube reaches the distal stomach at least. Low lung volumes with infrahilar hazy density. No edema, effusion, or pneumothorax. Upper normal heart size. IMPRESSION: Low volume chest with presumed atelectasis at the bases. Stable hardware positioning. Electronically Signed   By: Monte Fantasia M.D.   On: 12/03/2019 07:50   DG Chest Portable 1 View  Result Date: 12/02/2019 CLINICAL DATA:  Hypoxia EXAM: PORTABLE CHEST 1 VIEW COMPARISON:  July 06, 2018 FINDINGS: Endotracheal tube tip is 2.3 cm above the carina. Nasogastric tube tip and side port are below the diaphragm. No pneumothorax. Mild elevation of the right hemidiaphragm is stable. The lungs are clear. The heart is upper normal in size with pulmonary vascularity normal. No adenopathy. No bone lesions. There is aortic atherosclerosis. IMPRESSION: Tube positions as described without pneumothorax. Mild elevation of right hemidiaphragm is stable. Lungs are clear. Heart upper normal  in size. Aortic Atherosclerosis (ICD10-I70.0). Electronically Signed   By: Lowella Grip III M.D.   On: 12/02/2019 09:05   DG Abd Portable 1V  Result Date: 12/02/2019 CLINICAL DATA:  OG tube placement EXAM: PORTABLE ABDOMEN - 1 VIEW COMPARISON:  None FINDINGS: Gastric tube tip in the distal stomach, side port in the mid stomach. Visualized bowel gas pattern unremarkable but nonspecific as the entire abdomen is not imaged. No acute skeletal process. IMPRESSION: Gastric  tube tip in the distal stomach, side port in the mid stomach. Electronically Signed   By: Zetta Bills M.D.   On: 12/02/2019 16:36     Nutrition Status:           Indwelling Urinary Catheter continued, requirement due to   Reason to continue Indwelling Urinary Catheter strict Intake/Output monitoring for hemodynamic instability         Ventilator continued, requirement due to severe respiratory failure   Ventilator Sedation Lighten today, goal RASS 0      ASSESSMENT AND PLAN SYNOPSIS  Severe ACUTE Hypoxic and Hypercapnic Respiratory Failure -CXR with basilar volume loss -suspect chronic aspiration -continue Full MV support as indicated -continue Bronchodilator Therapy -Wean Fio2 but may require higher PEEP for body habitus -Aggressive sedation wean this morning -Plan to  perform SAT/SBT when respiratory parameters are met -VAP/VENT bundle implementation  Morbid obesity, possible OSA.   Will certainly impact respiratory mechanics, ventilator weaning Suspect will need to consider additional PEEP, possible extubation to BiPAP when appropriate to consider   ACUTE DIASTOLIC CARDIAC FAILURE?  -echo pending -maintain afterload reduction and rate control   ACUTE KIDNEY INJURY/Renal Failure -creatinine returned to normal 1.11 from  1.82 -continue Foley Catheter-assess need -Avoid nephrotoxic agents -Follow urine output, BMP -Ensure adequate renal perfusion, optimize oxygenation -Renal dose medications   NEUROLOGY - intubated and sedated, will wean sedation today hopefully patient will be alert - ammonia was only 23 yesterday, maintain regular dosing of lactulose - potential benzo abuse? - methadone?  CARDIAC ICU monitoring 2D echo pending  ID -Unasyn  -follow up cultures  GI Cirrhosis, HCV/EtOH Hepatic encephalopathy  Mild transaminitis   Maintain Lactulose  GI PROPHYLAXIS as indicated   ENDO - will use ICU hypoglycemic\Hyperglycemia protocol if  needed  ELECTROLYTES -follow labs as needed -replace as needed -pharmacy consultation and following  DVT/GI PRX ordered and assessed TRANSFUSIONS AS NEEDED MONITOR FSBS I Assessed the need for Labs I Assessed the need for Foley I Assessed the need for Central Venous Line Family Discussion with patient's wife at the bedside I Assessed the need for Mobilization I made an Assessment of medications to be adjusted accordingly Safety Risk assessment Completed   Critical Care Time devoted to patient care services described in this note is 35 minutes.   Overall, patient is critically ill, prognosis is guarded.    //Jil Penland

## 2019-12-03 NOTE — Progress Notes (Signed)
[place] visited pt.'s rm. while rounding on ICU and observing visitors @ bedside; pt. intubated and sedated lying in bed; pt.'s wife Rodena Piety @ bedside along w/pt.'s 'lifelong friend' [name escapes CH's memory].  No needs expressed at this time, but support persons aware of CH's availability if needed.     12/03/19 1400  Clinical Encounter Type  Visited With Family  Visit Type Initial

## 2019-12-03 NOTE — Plan of Care (Signed)
Per care plan 

## 2019-12-04 LAB — CBC
HCT: 39.1 % (ref 39.0–52.0)
Hemoglobin: 13.1 g/dL (ref 13.0–17.0)
MCH: 30.3 pg (ref 26.0–34.0)
MCHC: 33.5 g/dL (ref 30.0–36.0)
MCV: 90.3 fL (ref 80.0–100.0)
Platelets: 72 K/uL — ABNORMAL LOW (ref 150–400)
RBC: 4.33 MIL/uL (ref 4.22–5.81)
RDW: 14.3 % (ref 11.5–15.5)
WBC: 4.5 K/uL (ref 4.0–10.5)
nRBC: 0 % (ref 0.0–0.2)

## 2019-12-04 LAB — COMPREHENSIVE METABOLIC PANEL WITH GFR
ALT: 191 U/L — ABNORMAL HIGH (ref 0–44)
AST: 196 U/L — ABNORMAL HIGH (ref 15–41)
Albumin: 3 g/dL — ABNORMAL LOW (ref 3.5–5.0)
Alkaline Phosphatase: 50 U/L (ref 38–126)
Anion gap: 7 (ref 5–15)
BUN: 25 mg/dL — ABNORMAL HIGH (ref 6–20)
CO2: 26 mmol/L (ref 22–32)
Calcium: 8 mg/dL — ABNORMAL LOW (ref 8.9–10.3)
Chloride: 110 mmol/L (ref 98–111)
Creatinine, Ser: 1.05 mg/dL (ref 0.61–1.24)
GFR calc Af Amer: >60 mL/min
GFR calc non Af Amer: >60 mL/min
Glucose, Bld: 177 mg/dL — ABNORMAL HIGH (ref 70–99)
Potassium: 4.5 mmol/L (ref 3.5–5.1)
Sodium: 143 mmol/L (ref 135–145)
Total Bilirubin: 1.2 mg/dL (ref 0.3–1.2)
Total Protein: 6.4 g/dL — ABNORMAL LOW (ref 6.5–8.1)

## 2019-12-04 LAB — GLUCOSE, CAPILLARY
Glucose-Capillary: 133 mg/dL — ABNORMAL HIGH (ref 70–99)
Glucose-Capillary: 139 mg/dL — ABNORMAL HIGH (ref 70–99)
Glucose-Capillary: 161 mg/dL — ABNORMAL HIGH (ref 70–99)
Glucose-Capillary: 164 mg/dL — ABNORMAL HIGH (ref 70–99)
Glucose-Capillary: 168 mg/dL — ABNORMAL HIGH (ref 70–99)
Glucose-Capillary: 180 mg/dL — ABNORMAL HIGH (ref 70–99)
Glucose-Capillary: 194 mg/dL — ABNORMAL HIGH (ref 70–99)

## 2019-12-04 LAB — TRIGLYCERIDES: Triglycerides: 98 mg/dL (ref <150)

## 2019-12-04 MED ORDER — CHLORHEXIDINE GLUCONATE CLOTH 2 % EX PADS
6.0000 | MEDICATED_PAD | Freq: Every day | CUTANEOUS | Status: DC
  Administered 2019-12-05: 22:00:00 6 via TOPICAL

## 2019-12-04 MED ORDER — ORAL CARE MOUTH RINSE: 15.0000 mL | Freq: Two times a day (BID) | OROMUCOSAL | Status: DC

## 2019-12-04 MED ORDER — PANTOPRAZOLE SODIUM 40 MG IV SOLR
40.0000 mg | INTRAVENOUS | Status: DC
  Administered 2019-12-04: 40 mg via INTRAVENOUS

## 2019-12-04 NOTE — Progress Notes (Signed)
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 He would intermittently moan or mutter. Once EMS arrived they found him hypoxic in the mid to low 80s, but he became agitated with NRBM and would not allow it to remain. On arrival to the ED, it was much the same. Because of his persistent hypoxia and inability to wear exogenous oxygen, he was intubated tin the ED. He had a low grade temp just under 101F. Family was suspicious that the patient overdosed on his methadone prescription, however UDS also showed benzos, which are not on the patient's home list. His wife admits that he finds  her Xanax from time to time.  6/13 remains intubated,sedated, severe resp failure 6/14 remains intubated and sedated, severe resp failure      CC  follow up respiratory failure  SUBJECTIVE Patient remains critically ill Prognosis is guarded   BP (!) 143/66   Pulse 69   Temp 98.4 F (36.9 C) (Axillary)   Resp 20   Ht '5\' 10"'  (1.778 m)   Wt 121 kg   SpO2 95%   BMI 38.28 kg/m    I/O last 3 completed shifts: In: 4011 [I.V.:3349.7; IV Piggyback:661.3] Out: 2600 [Urine:1900; Emesis/NG output:700] No intake/output data recorded.  SpO2: 95 % FiO2 (%): 50 %  Estimated body mass index is 38.28 kg/m as calculated from the following:   Height as of this encounter: '5\' 10"'  (1.778 m).   Weight as of this encounter: 121 kg.  SIGNIFICANT EVENTS   REVIEW OF SYSTEMS  PATIENT IS UNABLE TO PROVIDE COMPLETE REVIEW OF SYSTEMS DUE TO SEVERE CRITICAL ILLNESS        PHYSICAL EXAMINATION:  GENERAL:critically ill appearing, +resp distress HEAD: Normocephalic, atraumatic.  EYES: Pupils equal, round, reactive to light.  No scleral icterus.  MOUTH: Moist mucosal membrane. NECK: Supple.  PULMONARY: +rhonchi, +wheezing CARDIOVASCULAR: S1 and S2. Regular rate and rhythm. No  murmurs, rubs, or gallops.  GASTROINTESTINAL: Soft, nontender, -distended.  Positive bowel sounds.   MUSCULOSKELETAL: No swelling, clubbing, or edema.  NEUROLOGIC: obtunded, GCS<8 SKIN:intact,warm,dry  MEDICATIONS: I have reviewed all medications and confirmed regimen as documented   CULTURE RESULTS   Recent Results (from the past 240 hour(s))  Blood Culture (routine x 2)     Status: None (Preliminary result)   Collection Time: 12/02/19  8:02 AM   Specimen: BLOOD  Result Value Ref Range Status   Specimen Description BLOOD BLOOD LEFT FOREARM  Final   Special Requests   Final    BOTTLES DRAWN AEROBIC AND ANAEROBIC Blood Culture adequate volume   Culture   Final    NO GROWTH 2 DAYS Performed at [facility], [address] [place], [place] [address]    Report Status PENDING  Incomplete  Blood Culture (routine x 2)     Status: None (Preliminary result)   Collection Time: 12/02/19  8:02 AM   Specimen: BLOOD  Result Value Ref Range Status   Specimen Description BLOOD BLOOD RIGHT HAND  Final   Special Requests   Final    BOTTLES DRAWN AEROBIC AND ANAEROBIC Blood Culture adequate volume   Culture   Final    NO GROWTH 2 DAYS Performed at [facility], [address], [place],  [address]    Report Status PENDING  Incomplete  SARS Coronavirus 2 by RT PCR (hospital order, performed in  [facility] hospital lab) Nasopharyngeal Nasopharyngeal Swab     Status: None   Collection Time: 12/02/19  8:22 AM   Specimen: Nasopharyngeal Swab  Result Value Ref Range Status   SARS Coronavirus 2 NEGATIVE NEGATIVE Final    Comment: (NOTE) SARS-CoV-2 target nucleic acids are NOT DETECTED.  The SARS-CoV-2 RNA is generally detectable in upper and lower respiratory specimens during the acute phase of infection. The lowest concentration of SARS-CoV-2 viral copies this assay can detect is 250 copies / mL. A negative result does not preclude SARS-CoV-2 infection and should not  be used as the sole basis for treatment or other patient management decisions.  A negative result may occur with improper specimen collection / handling, submission of specimen other than nasopharyngeal swab, presence of viral mutation(s) within the areas targeted by this assay, and inadequate number of viral copies (<250 copies / mL). A negative result must be combined with clinical observations, patient history, and epidemiological information.  Fact Sheet for Patients:   StrictlyIdeas.no  Fact Sheet for Healthcare Providers: BankingDealers.co.za  This test is not yet approved or  cleared by the [place] FDA and has been authorized for detection and/or diagnosis of SARS-CoV-2 by FDA under an Emergency Use Authorization (EUA).  This EUA will remain in effect (meaning this test can be used) for the duration of the COVID-19 declaration under Section 564(b)(1) of the Act, 21 U.S.C. section 360bbb-3(b)(1), unless the authorization is terminated or revoked sooner.  Performed at [facility], [address], [place], [place] [address]   Urine culture     Status: None   Collection Time: 12/02/19  9:15 AM   Specimen: In/Out Cath Urine  Result Value Ref Range Status   Specimen Description   Final    IN/OUT CATH URINE Performed at [facility], [address] [place], [place] [address]    Special Requests   Final    NONE Performed at [facility], [address] [place], [place] [address]    Culture   Final    NO GROWTH Performed at [facility], [place] [address], [place], [place] [address]    Report Status 12/03/2019 FINAL  Final  MRSA PCR Screening     Status: Abnormal   Collection Time: 12/02/19 12:42 PM   Specimen: Nasopharyngeal  Result Value Ref Range Status   MRSA by PCR POSITIVE (A) NEGATIVE Final    Comment:        The GeneXpert MRSA Assay (FDA approved for NASAL specimens only),  is one component of a comprehensive MRSA colonization surveillance program. It is not intended to diagnose MRSA infection nor to guide or monitor treatment for MRSA infections. RESULT CALLED TO, READ BACK BY AND VERIFIED WITH: [facility] MOORE AT 1829 12/02/19.PMF Performed at [facility], [address] [place], [place] [address]           IMAGING    No results found.   Nutrition Status:           Indwelling Urinary Catheter continued, requirement due to   Reason to continue Indwelling Urinary Catheter strict Intake/Output monitoring for hemodynamic instability         Ventilator continued, requirement due to severe respiratory failure   Ventilator Sedation RASS 0 to -2      ASSESSMENT AND PLAN SYNOPSIS  59 yo morbidly obese WM with acute resp failure from severe encephalopathy from Polysubstance drug abuse and poisoning with aspiration pnuemonia   Severe ACUTE  Hypoxic and Hypercapnic Respiratory Failure -continue Full MV support -continue Bronchodilator Therapy -Wean Fio2 and PEEP as tolerated -will perform SAT/SBT when respiratory parameters are met -VAP/VENT bundle implementation  ACUTE DIASTOLIC CARDIAC FAILURE-  -oxygen as needed -Lasix as tolerated Echo pending   Morbid obesity, possible OSA.   Will certainly impact respiratory mechanics, ventilator weaning Suspect will need to consider additional PEEP, possible extubation to BiPAP when appropriate to consider   ACUTE KIDNEY INJURY/Renal Failure -continue Foley Catheter-assess need -Avoid nephrotoxic agents -Follow urine output, BMP -Ensure adequate renal perfusion, optimize oxygenation -Renal dose medications     NEUROLOGY - intubated and sedated - minimal sedation to achieve a RASS goal: -1 Wake up assessment pending  CARDIAC ICU monitoring  ID -continue IV abx as prescibed -follow up cultures  GI GI PROPHYLAXIS as indicated  NUTRITIONAL STATUS Nutrition  Status:         DIET-->TF's as tolerated Constipation protocol as indicated  ENDO - will use ICU hypoglycemic\Hyperglycemia protocol if indicated     ELECTROLYTES -follow labs as needed -replace as needed -pharmacy consultation and following   DVT/GI PRX ordered and assessed TRANSFUSIONS AS NEEDED MONITOR FSBS I Assessed the need for Labs I Assessed the need for Foley I Assessed the need for Central Venous Line Family Discussion when available I Assessed the need for Mobilization I made an Assessment of medications to be adjusted accordingly Safety Risk assessment completed   CASE DISCUSSED IN MULTIDISCIPLINARY ROUNDS WITH ICU TEAM  Critical Care Time devoted to patient care services described in this note is 32 minutes.   Overall, patient is critically ill, prognosis is guarded.  Patient with Multiorgan failure and at high risk for cardiac arrest and death.    Corrin Parker, M.D.  [facility]  Medical Director [place] Director [facility] Cardio-Pulmonary Department

## 2019-12-04 NOTE — Progress Notes (Signed)
Extubated patient to nasal cannula per Dr. Montine Circle. Patient on 4.5L at this time oxygen saturation 92%.

## 2019-12-05 LAB — COMPREHENSIVE METABOLIC PANEL
ALT: 158 U/L — ABNORMAL HIGH (ref 0–44)
AST: 111 U/L — ABNORMAL HIGH (ref 15–41)
Albumin: 2.8 g/dL — ABNORMAL LOW (ref 3.5–5.0)
Alkaline Phosphatase: 46 U/L (ref 38–126)
Anion gap: 9 (ref 5–15)
BUN: 20 mg/dL (ref 6–20)
CO2: 27 mmol/L (ref 22–32)
Calcium: 7.9 mg/dL — ABNORMAL LOW (ref 8.9–10.3)
Chloride: 108 mmol/L (ref 98–111)
Creatinine, Ser: 0.77 mg/dL (ref 0.61–1.24)
GFR calc Af Amer: >60 mL/min (ref >60)
GFR calc non Af Amer: >60 mL/min (ref >60)
Glucose, Bld: 108 mg/dL — ABNORMAL HIGH (ref 70–99)
Potassium: 3.4 mmol/L — ABNORMAL LOW (ref 3.5–5.1)
Sodium: 144 mmol/L (ref 135–145)
Total Bilirubin: 1.1 mg/dL (ref 0.3–1.2)
Total Protein: 5.9 g/dL — ABNORMAL LOW (ref 6.5–8.1)

## 2019-12-05 LAB — CBC
HCT: 35.5 % — ABNORMAL LOW (ref 39.0–52.0)
Hemoglobin: 12.1 g/dL — ABNORMAL LOW (ref 13.0–17.0)
MCH: 30.3 pg (ref 26.0–34.0)
MCHC: 34.1 g/dL (ref 30.0–36.0)
MCV: 89 fL (ref 80.0–100.0)
Platelets: 65 10*3/uL — ABNORMAL LOW (ref 150–400)
RBC: 3.99 MIL/uL — ABNORMAL LOW (ref 4.22–5.81)
RDW: 13.7 % (ref 11.5–15.5)
WBC: 3.5 10*3/uL — ABNORMAL LOW (ref 4.0–10.5)
nRBC: 0 % (ref 0.0–0.2)

## 2019-12-05 LAB — GLUCOSE, CAPILLARY
Glucose-Capillary: 112 mg/dL — ABNORMAL HIGH (ref 70–99)
Glucose-Capillary: 95 mg/dL (ref 70–99)
Glucose-Capillary: 183 mg/dL — ABNORMAL HIGH (ref 70–99)
Glucose-Capillary: 168 mg/dL — ABNORMAL HIGH (ref 70–99)
Glucose-Capillary: 155 mg/dL — ABNORMAL HIGH (ref 70–99)

## 2019-12-05 LAB — PHOSPHORUS: Phosphorus: 3 mg/dL (ref 2.5–4.6)

## 2019-12-05 LAB — MAGNESIUM: Magnesium: 1.5 mg/dL — ABNORMAL LOW (ref 1.7–2.4)

## 2019-12-05 MED ORDER — MAGNESIUM SULFATE 2 GM/50ML IV SOLN
2.0000 g | Freq: Once | INTRAVENOUS | Status: AC
  Administered 2019-12-05: 2 g via INTRAVENOUS
  Filled 2019-12-05: qty 50

## 2019-12-05 MED ORDER — ACETAMINOPHEN 325 MG PO TABS
650.0000 mg | ORAL_TABLET | Freq: Four times a day (QID) | ORAL | Status: DC | PRN
  Administered 2019-12-05 – 2019-12-07 (×3): 650 mg via ORAL
  Filled 2019-12-05 (×2): qty 2

## 2019-12-05 MED ORDER — LISINOPRIL 20 MG PO TABS
40.0000 mg | ORAL_TABLET | Freq: Every day | ORAL | Status: DC
  Administered 2019-12-05 – 2019-12-07 (×3): 40 mg via ORAL
  Filled 2019-12-05 (×3): qty 2

## 2019-12-05 MED ORDER — ACETAMINOPHEN 325 MG PO TABS
ORAL_TABLET | ORAL | Status: AC
  Filled 2019-12-05: qty 2

## 2019-12-05 MED ORDER — POLYETHYLENE GLYCOL 3350 17 G PO PACK: 17.0000 g | PACK | Freq: Every day | ORAL | Status: DC

## 2019-12-05 MED ORDER — TRAZODONE HCL 50 MG PO TABS
50.0000 mg | ORAL_TABLET | Freq: Every evening | ORAL | Status: DC | PRN
  Administered 2019-12-05 – 2019-12-06 (×2): 50 mg via ORAL
  Filled 2019-12-05 (×2): qty 1

## 2019-12-05 MED ORDER — DOCUSATE SODIUM 100 MG PO CAPS: 100.0000 mg | ORAL_CAPSULE | Freq: Two times a day (BID) | ORAL | Status: DC

## 2019-12-05 MED ORDER — AMITRIPTYLINE HCL 25 MG PO TABS
50.0000 mg | ORAL_TABLET | Freq: Every evening | ORAL | Status: DC | PRN
  Administered 2019-12-05: 50 mg via ORAL
  Filled 2019-12-05 (×2): qty 1

## 2019-12-05 MED ORDER — POTASSIUM CHLORIDE CRYS ER 20 MEQ PO TBCR
40.0000 meq | EXTENDED_RELEASE_TABLET | Freq: Once | ORAL | Status: AC
  Administered 2019-12-05: 40 meq via ORAL
  Filled 2019-12-05: qty 2

## 2019-12-05 MED ORDER — PANTOPRAZOLE SODIUM 40 MG PO TBEC
40.0000 mg | DELAYED_RELEASE_TABLET | Freq: Every day | ORAL | Status: DC
  Administered 2019-12-05 – 2019-12-07 (×3): 40 mg via ORAL
  Filled 2019-12-05 (×3): qty 1

## 2019-12-05 MED ORDER — AMOXICILLIN-POT CLAVULANATE 875-125 MG PO TABS
1.0000 | ORAL_TABLET | Freq: Two times a day (BID) | ORAL | Status: DC
  Administered 2019-12-05 – 2019-12-07 (×4): 1 via ORAL
  Filled 2019-12-05 (×5): qty 1

## 2019-12-05 MED ORDER — INSULIN ASPART 100 UNIT/ML ~~LOC~~ SOLN
0.0000 [IU] | Freq: Three times a day (TID) | SUBCUTANEOUS | Status: DC
  Administered 2019-12-05 (×2): 3 [IU] via SUBCUTANEOUS
  Administered 2019-12-06: 2 [IU] via SUBCUTANEOUS
  Administered 2019-12-06: 3 [IU] via SUBCUTANEOUS
  Administered 2019-12-06: 5 [IU] via SUBCUTANEOUS
  Administered 2019-12-07: 3 [IU] via SUBCUTANEOUS
  Administered 2019-12-07: 2 [IU] via SUBCUTANEOUS
  Filled 2019-12-05 (×7): qty 1

## 2019-12-05 MED ORDER — RIFAXIMIN 550 MG PO TABS
550.0000 mg | ORAL_TABLET | Freq: Two times a day (BID) | ORAL | Status: DC
  Administered 2019-12-05 – 2019-12-07 (×5): 550 mg via ORAL
  Filled 2019-12-05 (×5): qty 1

## 2019-12-05 MED ORDER — LACTULOSE 10 GM/15ML PO SOLN
20.0000 g | Freq: Three times a day (TID) | ORAL | Status: DC
  Administered 2019-12-05 – 2019-12-07 (×7): 20 g via ORAL
  Filled 2019-12-05 (×7): qty 30

## 2019-12-05 NOTE — Progress Notes (Signed)
One dose of amitriptyline given at 0212 to assist with anxiety and promote rest. This medication was helpful in providing relief for the patient, and he was better able to rest after administration.  Cameron Ali, RN

## 2019-12-05 NOTE — Progress Notes (Signed)
Accepted patient from PCCM Dr. Mortimer Fries to be picked up by [facility] on 12/06/2019 at 7 AM.  Patient admitted for with acute resp failure from severe encephalopathy from Polysubstance drug abuse and poisoning with aspiration pnuemonia.  Was intubated on admission and extubated on 12/04/2019.  Foley catheter to be discontinued before transfer to the floors.  He is on room air now.

## 2019-12-05 NOTE — Progress Notes (Addendum)
Patient successfully extubated at around 1950 on 6/15. Patient is currently on 4 liters nasal cannula to assist with oxygenation. He is alert and oriented x4 and somewhat anxious, using his call bell very often for requesting ice, wash cloths, warm blankets etc. The patient was reassured and educated on call light use. I believe he will be more calm when his wife Rodena Piety) returns to the bedside in the morning. The patient passed the [place], so his diet was transitioned to clear liquids. He is receiving lactated ringers at 75 mL/hr and Unasyn every 6 hours. He has a Foley catheter for output collection. He is currently comfortable and in no distress. Vital signs are stable. Will continue to monitor.   Cameron Ali, RN

## 2019-12-05 NOTE — Progress Notes (Signed)
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 He would intermittently moan or mutter. Once EMS arrived they found him hypoxic in the mid to low 80s, but he became agitated with NRBM and would not allow it to remain. On arrival to the ED, it was much the same. Because of his persistent hypoxia and inability to wear exogenous oxygen, he was intubated tin the ED. He had a low grade temp just under 101F. Family was suspicious that the patient overdosed on his methadone prescription, however UDS also showed benzos, which are not on the patient's home list. His wife admits that he finds  her Xanax from time to time.  6/13 remains intubated,sedated, severe resp failure 6/14 remains intubated and sedated, severe resp failure 6/14 successfully extubated 6/15 alert and awake, follows commands, off oxygen      CC  Follow up resp failure  SUBJECTIVE Alert and awake Follows commands NAD    BP (!) 154/74   Pulse 65   Temp 98.2 F (36.8 C) (Oral)   Resp 17   Ht 5\' 10"  (1.778 m)   Wt 124.3 kg   SpO2 96%   BMI 39.32 kg/m    I/O last 3 completed shifts: In: 4237.6 [I.V.:3276.1; IV Piggyback:961.5] Out: 2575 [Urine:2225; Emesis/NG output:350] No intake/output data recorded.  SpO2: 96 % O2 Flow Rate (L/min): 2 L/min FiO2 (%): 36 %  Estimated body mass index is 39.32 kg/m as calculated from the following:   Height as of this encounter: 5\' 10"  (1.778 m).   Weight as of this encounter: 124.3 kg.   Review of Systems:  Gen:  Denies  fever, sweats, chills weight loss  HEENT: Denies blurred vision, double vision, ear pain, eye pain, hearing loss, nose bleeds, sore throat Cardiac:  No dizziness, chest pain or heaviness, chest tightness,edema, No JVD Resp:   No cough, -sputum production, -shortness of breath,-wheezing, -hemoptysis,  Gi: Denies swallowing  difficulty, stomach pain, nausea or vomiting, diarrhea, constipation, bowel incontinence Gu:  Denies bladder incontinence, burning urine Psych:   Denies depression, insomnia or hallucinations  Other:  All other systems negative    Physical Examination:   General Appearance: No distress  Neuro:without focal findings,  speech normal,  HEENT: PERRLA, EOM intact.   Pulmonary: normal breath sounds, No wheezing.  CardiovascularNormal S1,S2.  No m/r/g.   Abdomen: Benign, Soft, non-tender. Renal:  No costovertebral tenderness  GU:  Not performed at this time. Endoc: No evident thyromegaly Skin:   warm, no rashes, no ecchymosis  Extremities: normal, no cyanosis, clubbing. PSYCHIATRIC: Mood, affect within normal limits.   ALL OTHER ROS ARE NEGATIVE   MEDICATIONS: I have reviewed all medications and confirmed regimen as documented   CULTURE RESULTS   Recent Results (from the past 240 hour(s))  Blood Culture (routine x 2)     Status: None (Preliminary result)   Collection Time: 12/02/19  8:02 AM   Specimen: BLOOD  Result Value Ref Range Status   Specimen Description BLOOD BLOOD LEFT FOREARM  Final   Special Requests   Final    BOTTLES DRAWN AEROBIC AND ANAEROBIC Blood Culture adequate volume   Culture   Final    NO GROWTH 3 DAYS Performed at [facility], [address], [place], [place] [address]    Report Status PENDING  Incomplete  Blood Culture (routine x 2)  Status: None (Preliminary result)   Collection Time: 12/02/19  8:02 AM   Specimen: BLOOD  Result Value Ref Range Status   Specimen Description BLOOD BLOOD RIGHT HAND  Final   Special Requests   Final    BOTTLES DRAWN AEROBIC AND ANAEROBIC Blood Culture adequate volume   Culture   Final    NO GROWTH 3 DAYS Performed at [facility], [address] [place], [place] [address]    Report Status PENDING  Incomplete  SARS Coronavirus 2 by RT PCR (hospital order, performed in [facility] hospital  lab) Nasopharyngeal Nasopharyngeal Swab     Status: None   Collection Time: 12/02/19  8:22 AM   Specimen: Nasopharyngeal Swab  Result Value Ref Range Status   SARS Coronavirus 2 NEGATIVE NEGATIVE Final    Comment: (NOTE) SARS-CoV-2 target nucleic acids are NOT DETECTED.  The SARS-CoV-2 RNA is generally detectable in upper and lower respiratory specimens during the acute phase of infection. The lowest concentration of SARS-CoV-2 viral copies this assay can detect is 250 copies / mL. A negative result does not preclude SARS-CoV-2 infection and should not be used as the sole basis for treatment or other patient management decisions.  A negative result may occur with improper specimen collection / handling, submission of specimen other than nasopharyngeal swab, presence of viral mutation(s) within the areas targeted by this assay, and inadequate number of viral copies (<250 copies / mL). A negative result must be combined with clinical observations, patient history, and epidemiological information.  Fact Sheet for Patients:   StrictlyIdeas.no  Fact Sheet for Healthcare Providers: BankingDealers.co.za  This test is not yet approved or  cleared by the [place] FDA and has been authorized for detection and/or diagnosis of SARS-CoV-2 by FDA under an Emergency Use Authorization (EUA).  This EUA will remain in effect (meaning this test can be used) for the duration of the COVID-19 declaration under Section 564(b)(1) of the Act, 21 U.S.C. section 360bbb-3(b)(1), unless the authorization is terminated or revoked sooner.  Performed at [facility], [address], [place], [place] [address]   Urine culture     Status: None   Collection Time: 12/02/19  9:15 AM   Specimen: In/Out Cath Urine  Result Value Ref Range Status   Specimen Description   Final    IN/OUT CATH URINE Performed at [facility], [address], [place], [place] [address]    Special Requests   Final    NONE Performed at [facility], [address] [place], [place] [address]    Culture   Final    NO GROWTH Performed at [facility], [place] [address], [place], [place] [address]    Report Status 12/03/2019 FINAL  Final  MRSA PCR Screening     Status: Abnormal   Collection Time: 12/02/19 12:42 PM   Specimen: Nasopharyngeal  Result Value Ref Range Status   MRSA by PCR POSITIVE (A) NEGATIVE Final    Comment:        The GeneXpert MRSA Assay (FDA approved for NASAL specimens only), is one component of a comprehensive MRSA colonization surveillance program. It is not intended to diagnose MRSA infection nor to guide or monitor treatment for MRSA infections. RESULT CALLED TO, READ BACK BY AND VERIFIED WITH: [facility] MOORE AT 7253 12/02/19.PMF Performed at [facility], [address] [place], [place] [address]         BMP Latest Ref Rng & Units 12/05/2019 12/04/2019  12/03/2019  Glucose 70 - 99 mg/dL 108(H) 177(H) 179(H)  BUN 6 - 20 mg/dL 20 25(H) 26(H)  Creatinine 0.61 - 1.24 mg/dL 0.77 1.05 1.11  BUN/Creat Ratio 9 - 20 - - -  Sodium 135 - 145 mmol/L 144 143 141  Potassium 3.5 - 5.1 mmol/L 3.4(L) 4.5 3.8  Chloride 98 - 111 mmol/L 108 110 108  CO2 22 - 32 mmol/L 27 26 26   Calcium 8.9 - 10.3 mg/dL 7.9(L) 8.0(L) 8.0(L)       ASSESSMENT AND PLAN SYNOPSIS  59 yo morbidly obese WM with acute resp failure from severe encephalopathy from Polysubstance drug abuse and poisoning with aspiration pnuemonia  resp failure resolved  ACUTE DIASTOLIC CARDIAC FAILURE- resolved  Morbid obesity, possible OSA.    ID for aspiration pneumonia -continue IV abx as prescibed   DIET--as tolerated Constipation protocol as indicated  ENDO - will use ICU hypoglycemic\Hyperglycemia protocol if indicated     ELECTROLYTES -follow labs as needed -replace as needed -pharmacy consultation and  following   DVT/GI PRX ordered and assessed Remove foley  Transfer to med surg Transfer to [place] consult pending  Corrin Parker, M.D.  [facility]  Medical Director [place] Director [place] Department

## 2019-12-06 ENCOUNTER — Encounter: Payer: Self-pay | Admitting: Pulmonary Disease

## 2019-12-06 ENCOUNTER — Ambulatory Visit: Payer: Self-pay | Admitting: Internal Medicine

## 2019-12-06 ENCOUNTER — Ambulatory Visit: Payer: Self-pay | Admitting: Gerontology

## 2019-12-06 DIAGNOSIS — K7460 Unspecified cirrhosis of liver: Secondary | ICD-10-CM

## 2019-12-06 DIAGNOSIS — N179 Acute kidney failure, unspecified: Secondary | ICD-10-CM | POA: Diagnosis present

## 2019-12-06 DIAGNOSIS — G8929 Other chronic pain: Secondary | ICD-10-CM | POA: Diagnosis present

## 2019-12-06 DIAGNOSIS — G9341 Metabolic encephalopathy: Secondary | ICD-10-CM

## 2019-12-06 LAB — GLUCOSE, CAPILLARY
Glucose-Capillary: 131 mg/dL — ABNORMAL HIGH (ref 70–99)
Glucose-Capillary: 231 mg/dL — ABNORMAL HIGH (ref 70–99)
Glucose-Capillary: 191 mg/dL — ABNORMAL HIGH (ref 70–99)

## 2019-12-06 MED ORDER — HYDROCHLOROTHIAZIDE 25 MG PO TABS
25.0000 mg | ORAL_TABLET | Freq: Every day | ORAL | Status: DC
  Administered 2019-12-06 – 2019-12-07 (×2): 25 mg via ORAL
  Filled 2019-12-06 (×2): qty 1

## 2019-12-06 NOTE — Progress Notes (Addendum)
Progress Note    Michael Mack  NKN:[id number] DOB: 04/19/1961  DOA: 12/02/2019 PCP: Tawni Millers, MD    Brief Narrative:     Medical records reviewed and are as summarized below:  Michael Mack is an 59 y.o. male with a past medical history that includes hepatic encephalopathy related to EtOH and HCV, hypertension, diabetes type 2, chronic pain followed by pain specialist, cirrhosis, anxiety admitted June 12 by critical care service with acute respiratory failure with hypoxia likely related to drug overdose. He was intubated on that day and extubated 2 days later and transferred to hospitalist service. Awaiting evaluation by behavioral health  Assessment/Plan:   Principal Problem:   Acute respiratory failure with hypoxia (HCC) Active Problems:   Aspiration into lower respiratory tract   Diabetes (HCC)   GERD (gastroesophageal reflux disease)   Hypertension   Cirrhosis ([place])   Acute metabolic encephalopathy   Acute kidney injury ([place])   Chronic pain   #1. Acute respiratory failure with hypoxia secondary to drug overdose in the setting of likely chronic aspiration. Was intubated on admission and extubated 2 days later. He was provided with IV antibiotics as well was transitioned to p.o. yesterday. He remained afebrile hemodynamically stable and nontoxic-appearing. Urine drug screen positive for benzos which is not on his home medication list. Wife reports that he gets into hers from "time to time". Home medications to include methadone, oxycodone. Urine drug screen positive for benzos, methadone, opiates, tricyclics. Oxygen saturation level 94% on room air. -Continue Augmentin -Monitor oxygen saturation level -Incentive spirometry  #2. Aspiration pneumonia. Chest x-ray on admission with atelectasis. IV antibiotics transition to Augmentin on 6/15 for 7 days. -Continue Augmentin -Consider speech eval  #3. aCute metabolic encephalopathy in the setting of drug overdose and  chronic liver disease. Resolved -Monitor  #4. Acute kidney injury. Likely related to above. Dineen 1.8 on admission. Yesterday creatinine within the level of normal -Hold nephrotoxins -Monitor intake and output  #5. Cirrhosis. Occasions include lactulose, rifaximin. Liver function tests appear stable at baseline. Total bili 1.1. Report compliance with medications -Continue home meds  #6. Diabetes. Type II. Uncontrolled. Hemoglobin A1c 7.3. Home medications include insulin. Serum glucose 359 on admission. Currently on moderate sliding scale. Control so far -Continue sliding scale -Monitor closely and resume Lantus once appetite becomes more reliable  #7. Hypertension. High end of normal. Home medications include hydrochlorothiazide, lisinopril. -Lisinopril was ordered but hydrochlorothiazide initially held -resume HCTZ -monitor  #8. Chronic pain. Chart review indicates a long history of chronic low back pain as well as bilateral knee pain. Is followed by [place] pain specialist. Home medications include baclofen, methadone 10 mg 1 or 2 tabs 3 or 4 times a day, oxycodone 30 mg 3 times a day as needed, amitriptyline and Narcan. These were all positive in his urine drug screen as well as benzos that reportedly belonged to his wife. 1 presumed accidental. Denies intentional OD -await eval with BH -close follow up with pain specialist   Family Communication/Anticipated D/C date and plan/Code Status   DVT prophylaxis: Lovenox ordered. Code Status: Full Code.  Family Communication: Patient Disposition Plan: Status is: Inpatient  Remains inpatient appropriate because:Inpatient level of care appropriate due to severity of illness   Dispo: The patient is from: Home              Anticipated d/c is to: Home              Anticipated d/c date  is: 1 day              Patient currently is not medically stable to d/c.         Medical Consultants:    None.   Anti-Infectives:     None  Subjective:   Reports that he has a lot of pain and therefore needs all of his home meds. Denies sob.  Objective:    Vitals:   12/05/19 1933 12/05/19 2337 12/06/19 0312 12/06/19 0729  BP: (!) 155/73 (!) 164/80 (!) 173/90 (!) 178/86  Pulse: 81 81 83 84  Resp: 16 17 18 20   Temp: 98.6 F (37 C) 98.1 F (36.7 C) 98.5 F (36.9 C) 98.5 F (36.9 C)  TempSrc: Oral Oral Oral Oral  SpO2: 96% 92% 92% 94%  Weight:      Height:        Intake/Output Summary (Last 24 hours) at 12/06/2019 1194 Last data filed at 12/05/2019 2337 Gross per 24 hour  Intake 623.36 ml  Output 2050 ml  Net -1426.64 ml   Filed Weights   12/02/19 1241 12/04/19 0500 12/05/19 1740  Weight: 121.5 kg 121 kg 124.3 kg    Exam: General: Sitting up in bed eating breakfast obese alert no acute distress CV regular rate and rhythm no murmur gallop or rub Respiratory no increased work of breathing with conversation breath sounds clear bilaterally Abdomen: Obese soft positive bowel sounds throughout Musculoskeletal joints without swelling/erythema full range of motion Neuro: Alert and oriented x3 speech clear facial symmetry  Data Reviewed:   I have personally reviewed following labs and imaging studies:  Labs: Labs show the following:   Basic Metabolic Panel: Recent Labs  Lab 11/29/19 1001 11/29/19 1001 12/02/19 0802 12/02/19 0802 12/02/19 1412 12/03/19 0516 12/03/19 0516 12/04/19 0556 12/05/19 0432  NA 138  --  136  --   --  141  --  143 144  K 4.3   < > 4.4   < >  --  3.8   < > 4.5 3.4*  CL 99  --  100  --   --  108  --  110 108  CO2 24  --  22  --   --  26  --  26 27  GLUCOSE 288*  --  359*  --   --  179*  --  177* 108*  BUN 15  --  27*  --   --  26*  --  25* 20  CREATININE 0.72*   < > 1.86*  --  1.82* 1.11  --  1.05 0.77  CALCIUM 9.0  --  9.4  --   --  8.0*  --  8.0* 7.9*  MG  --   --   --   --   --   --   --   --  1.5*  PHOS  --   --   --   --   --   --   --   --  3.0   < > =  values in this interval not displayed.   GFR Estimated Creatinine Clearance: 133.1 mL/min (by C-G formula based on SCr of 0.77 mg/dL). Liver Function Tests: Recent Labs  Lab 11/29/19 1001 12/02/19 0802 12/03/19 0516 12/04/19 0556 12/05/19 0432  AST 25 88* 101* 196* 111*  ALT 22 62* 89* 191* 158*  ALKPHOS 76 71 54 50 46  BILITOT 0.3 0.9 0.8 1.2 1.1  PROT 6.6 7.7 6.0* 6.4* 5.9*  ALBUMIN 3.7* 4.0 3.0* 3.0* 2.8*   No results for input(s): LIPASE, AMYLASE in the last 168 hours. Recent Labs  Lab 12/02/19 1412  AMMONIA 23   Coagulation profile Recent Labs  Lab 11/29/19 1001 12/02/19 0802  INR 1.1 1.3*    CBC: Recent Labs  Lab 12/02/19 0802 12/02/19 1412 12/03/19 0516 12/04/19 0556 12/05/19 0432  WBC 10.9* 5.9 4.3 4.5 3.5*  NEUTROABS 9.5*  --   --   --   --   HGB 14.6 12.8* 12.3* 13.1 12.1*  HCT 42.2 38.1* 36.4* 39.1 35.5*  MCV 89.4 91.4 90.5 90.3 89.0  PLT 123* 86* 75* 72* 65*   Cardiac Enzymes: No results for input(s): CKTOTAL, CKMB, CKMBINDEX, TROPONINI in the last 168 hours. BNP (last 3 results) No results for input(s): PROBNP in the last 8760 hours. CBG: Recent Labs  Lab 12/05/19 0754 12/05/19 1121 12/05/19 1707 12/05/19 2142 12/06/19 0725  GLUCAP 95 183* 168* 155* 131*   D-Dimer: No results for input(s): DDIMER in the last 72 hours. Hgb A1c: No results for input(s): HGBA1C in the last 72 hours. Lipid Profile: Recent Labs    12/04/19 0556  TRIG 98   Thyroid function studies: No results for input(s): TSH, T4TOTAL, T3FREE, THYROIDAB in the last 72 hours.  Invalid input(s): FREET3 Anemia work up: No results for input(s): VITAMINB12, FOLATE, FERRITIN, TIBC, IRON, RETICCTPCT in the last 72 hours. Sepsis Labs: Recent Labs  Lab 12/02/19 0802 12/02/19 0802 12/02/19 1026 12/02/19 1412 12/03/19 0516 12/04/19 0556 12/05/19 0432  WBC 10.9*   < >  --  5.9 4.3 4.5 3.5*  LATICACIDVEN 2.5*  --  1.8  --   --   --   --    < > = values in this  interval not displayed.    Microbiology Recent Results (from the past 240 hour(s))  Blood Culture (routine x 2)     Status: None (Preliminary result)   Collection Time: 12/02/19  8:02 AM   Specimen: BLOOD  Result Value Ref Range Status   Specimen Description BLOOD BLOOD LEFT FOREARM  Final   Special Requests   Final    BOTTLES DRAWN AEROBIC AND ANAEROBIC Blood Culture adequate volume   Culture   Final    NO GROWTH 4 DAYS Performed at [facility], [address] [place], [place] [address]    Report Status PENDING  Incomplete  Blood Culture (routine x 2)     Status: None (Preliminary result)   Collection Time: 12/02/19  8:02 AM   Specimen: BLOOD  Result Value Ref Range Status   Specimen Description BLOOD BLOOD RIGHT HAND  Final   Special Requests   Final    BOTTLES DRAWN AEROBIC AND ANAEROBIC Blood Culture adequate volume   Culture   Final    NO GROWTH 4 DAYS Performed at [facility], [address] [place], [place] [address]    Report Status PENDING  Incomplete  SARS Coronavirus 2 by RT PCR (hospital order, performed in [place] hospital lab) Nasopharyngeal Nasopharyngeal Swab     Status: None   Collection Time: 12/02/19  8:22 AM   Specimen: Nasopharyngeal Swab  Result Value Ref Range Status   SARS Coronavirus 2 NEGATIVE NEGATIVE Final    Comment: (NOTE) SARS-CoV-2 target nucleic acids are NOT DETECTED.  The SARS-CoV-2 RNA is generally detectable in upper and lower respiratory specimens during the acute phase of infection. The lowest concentration of SARS-CoV-2 viral copies this assay can detect is  250 copies / mL. A negative result does not preclude SARS-CoV-2 infection and should not be used as the sole basis for treatment or other patient management decisions.  A negative result may occur with improper specimen collection / handling, submission of specimen other than nasopharyngeal swab, presence of viral mutation(s) within the areas targeted  by this assay, and inadequate number of viral copies (<250 copies / mL). A negative result must be combined with clinical observations, patient history, and epidemiological information.  Fact Sheet for Patients:   StrictlyIdeas.no  Fact Sheet for Healthcare Providers: BankingDealers.co.za  This test is not yet approved or  cleared by the [place] FDA and has been authorized for detection and/or diagnosis of SARS-CoV-2 by FDA under an Emergency Use Authorization (EUA).  This EUA will remain in effect (meaning this test can be used) for the duration of the COVID-19 declaration under Section 564(b)(1) of the Act, 21 U.S.C. section 360bbb-3(b)(1), unless the authorization is terminated or revoked sooner.  Performed at [facility], [address], [place], [place] [address]   Urine culture     Status: None   Collection Time: 12/02/19  9:15 AM   Specimen: In/Out Cath Urine  Result Value Ref Range Status   Specimen Description   Final    IN/OUT CATH URINE Performed at [facility], [address], [place], [place] [address]    Special Requests   Final    NONE Performed at [facility], [address] [place], [place] [address]    Culture   Final    NO GROWTH Performed at [facility], [place] [address], [place], [place] [address]    Report Status 12/03/2019 FINAL  Final  MRSA PCR Screening     Status: Abnormal   Collection Time: 12/02/19 12:42 PM   Specimen: Nasopharyngeal  Result Value Ref Range Status   MRSA by PCR POSITIVE (A) NEGATIVE Final    Comment:        The GeneXpert MRSA Assay (FDA approved for NASAL specimens only), is one component of a comprehensive MRSA colonization surveillance program. It is not intended to diagnose MRSA infection nor to guide or monitor treatment for MRSA infections. RESULT CALLED TO, READ BACK BY AND VERIFIED WITH: [facility] MOORE AT 4081  12/02/19.PMF Performed at [facility], [address] [place], [place] [address]     Procedures and diagnostic studies:  No results found.  Medications:   . amoxicillin-clavulanate  1 tablet Oral Q12H  . Chlorhexidine Gluconate Cloth  6 each Topical Q0600  . heparin  5,000 Units Subcutaneous Q8H  . insulin aspart  0-15 Units Subcutaneous TID WC  . lactulose  20 g Oral TID  . lisinopril  40 mg Oral Daily  . mouth rinse  15 mL Mouth Rinse BID  . mupirocin ointment  1 application Nasal BID  . pantoprazole  40 mg Oral Daily  . rifaximin  550 mg Oral BID   Continuous Infusions:   LOS: 4 days   Radene Gunning NP [facility]   How to contact the [facility] Attending or Consulting provider [place] or covering provider during after hours [place], for this patient?  1. Check the care team in [facility] and look for a) attending/consulting [facility] provider listed and b) the [facility] team listed 2. Log into [email or web address] and use [place]'s universal password to access. If you do not have the password, please contact the hospital operator. 3. Locate the [facility] provider  you are looking for under [facility] and page to a number that you can be directly reached. 4. If you still have difficulty reaching the provider, please page the [facility] (Director on Call) for the Hospitalists listed on amion for assistance.  12/06/2019, 9:28 AM    Attending provider attestation: I have reviewed patient's medical records, labs, vitals, medications myself.  I have reviewed his case with nurse practitioner/Karen Black and have reviewed her documentation myself.  I have also seen and examined the patient myself and discussed the plan of care with him.  59 year old male with history of hepatic encephalopathy from alcohol abuse and HCV, cirrhosis of liver, diabetes mellitus type II, hypertension, chronic pain on pain medications and anxiety presented with acute respiratory failure with hypoxia likely related to  drug overdose for which she was intubated and admitted to ICU.  He was also started on IV antibiotics for possible aspiration.  He was subsequently extubated, antibiotic switched to oral and transferred to medical floor.  TR[facility] is taken over his care from 12/06/2019 onwards.  Currently, respiratory status has much improved and is currently on room air.  He is awaiting evaluation by psychiatry.  PT evaluation is pending.  Continue Augmentin.  Incentive spirometry.  Fall precautions.  Will need outpatient follow-up with his pain management specialist.  Possible discharge in a.m. if cleared by psychiatry.  Repeat a.m. labs.  Aline August, MD [facility]

## 2019-12-06 NOTE — TOC Initial Note (Signed)
Transition of Care [facility]) - Initial/Assessment Note    Patient Details  Name: Michael Mack MRN: [id number] Date of Birth: 1960/12/24  Transition of Care [facility]) CM/SW Contact:    Elease Hashimoto, LCSW Phone Number: 12/06/2019, 10:46 AM  Clinical Narrative:  Met with pt and wife at bedside who reports he did not OD it was his ammonia level that was too high and made him act like that and have to be admitted. She reports he is unable to work due to bad knees and back but is independent and can stay alone while she works. She has family members who check on him while she is working. He can mow the lawn and do some home management if his knees allow him too. He is followed by PCP and she does the driving. She feels they have no needs and if do she will let me know. Pt does need PT eval to see how well he is moving. Will continue to follow and see if any discharge needs.                 Expected Discharge Plan: Home/Self Care Barriers to Discharge: Continued Medical Work up, Inadequate or no insurance   Patient Goals and CMS Choice Patient states their goals for this hospitalization and ongoing recovery are:: I feel better and want a shower to wash my hair      Expected Discharge Plan and Services Expected Discharge Plan: Home/Self Care In-house Referral: Clinical Social Work     Living arrangements for the past 2 months: Single Family Home                                      Prior Living Arrangements/Services Living arrangements for the past 2 months: Single Family Home Lives with:: Spouse Patient language and need for interpreter reviewed:: No Do you feel safe going back to the place where you live?: Yes      Need for Family Participation in Patient Care: Yes (Comment) Care giver support system in place?: Yes (comment) Current home services: DME (cane) Criminal Activity/Legal Involvement Pertinent to Current Situation/Hospitalization: No - Comment as  needed  Activities of Daily Living Home Assistive Devices/Equipment: Eyeglasses, Dentures (specify type) (upper and lower per wife (though currently at home w/ glasse) ADL Screening (condition at time of admission) Patient's cognitive ability adequate to safely complete daily activities?: Yes Is the patient deaf or have difficulty hearing?: No Does the patient have difficulty seeing, even when wearing glasses/contacts?: No Does the patient have difficulty concentrating, remembering, or making decisions?: No Patient able to express need for assistance with ADLs?: Yes Does the patient have difficulty dressing or bathing?: No Independently performs ADLs?: Yes (appropriate for developmental age) Does the patient have difficulty walking or climbing stairs?: Yes (shuffles feet per wife) Weakness of Legs: None Weakness of Arms/Hands: None  Permission Sought/Granted Permission sought to share information with : Family Supports Permission granted to share information with : Yes, Verbal Permission Granted  Share Information with NAME: Rodena Piety     Permission granted to share info w Relationship: wife     Emotional Assessment Appearance:: Appears older than stated age Attitude/Demeanor/Rapport: Engaged Affect (typically observed): Adaptable, Accepting Orientation: : Oriented to Self, Oriented to Place, Oriented to  Time, Oriented to Situation Alcohol / Substance Use: Other (comment) (takes menthdone) Psych Involvement: No (comment)  Admission diagnosis:  Altered mental status [R41.82]  Patient Active Problem List   Diagnosis Date Noted  . Acute kidney injury ([place]) 12/06/2019  . Chronic pain 12/06/2019  . Altered mental status 12/02/2019  . Acute respiratory failure with hypoxia ([place])   . Aspiration into lower respiratory tract   . Acute metabolic encephalopathy 85/20/7409  . Thrombocytopenia ([place]) 09/11/2015  . Cirrhosis ([place]) 09/11/2015  . Hepatitis C 04/18/2015  . Diabetes ([place])  01/01/2015  . GERD (gastroesophageal reflux disease) 01/01/2015  . Hypertension 01/01/2015  . Fatty liver 01/01/2015   PCP:  Tawni Millers, MD Pharmacy:   [facility] [place], [address] [place] [address] Phone: [phone] Fax: [phone]  [place], [place] - [address] [place] [address] Phone: [phone] Fax: [phone]     Social Determinants of Health (SDOH) Interventions    Readmission Risk Interventions No flowsheet data found.

## 2019-12-07 DIAGNOSIS — G92 Toxic encephalopathy: Secondary | ICD-10-CM

## 2019-12-07 LAB — CBC
HCT: 39.1 % (ref 39.0–52.0)
Hemoglobin: 14.2 g/dL (ref 13.0–17.0)
MCH: 30.9 pg (ref 26.0–34.0)
MCHC: 36.3 g/dL — ABNORMAL HIGH (ref 30.0–36.0)
MCV: 85 fL (ref 80.0–100.0)
Platelets: 87 10*3/uL — ABNORMAL LOW (ref 150–400)
RBC: 4.6 MIL/uL (ref 4.22–5.81)
RDW: 13.5 % (ref 11.5–15.5)
WBC: 4.6 10*3/uL (ref 4.0–10.5)
nRBC: 0 % (ref 0.0–0.2)

## 2019-12-07 LAB — BASIC METABOLIC PANEL
Anion gap: 11 (ref 5–15)
BUN: 16 mg/dL (ref 6–20)
CO2: 25 mmol/L (ref 22–32)
Calcium: 8.5 mg/dL — ABNORMAL LOW (ref 8.9–10.3)
Chloride: 104 mmol/L (ref 98–111)
Creatinine, Ser: 0.77 mg/dL (ref 0.61–1.24)
GFR calc Af Amer: >60 mL/min (ref >60)
GFR calc non Af Amer: >60 mL/min (ref >60)
Glucose, Bld: 167 mg/dL — ABNORMAL HIGH (ref 70–99)
Potassium: 3.7 mmol/L (ref 3.5–5.1)
Sodium: 140 mmol/L (ref 135–145)

## 2019-12-07 LAB — GLUCOSE, CAPILLARY
Glucose-Capillary: 138 mg/dL — ABNORMAL HIGH (ref 70–99)
Glucose-Capillary: 198 mg/dL — ABNORMAL HIGH (ref 70–99)

## 2019-12-07 LAB — CULTURE, BLOOD (ROUTINE X 2)
Culture: NO GROWTH
Culture: NO GROWTH
Special Requests: ADEQUATE
Special Requests: ADEQUATE

## 2019-12-07 LAB — MAGNESIUM: Magnesium: 1.8 mg/dL (ref 1.7–2.4)

## 2019-12-07 NOTE — Discharge Summary (Signed)
Physician Discharge Summary  Michael Mack ERX:[id number] DOB: [birth year] DOA: 12/02/2019  PCP: Tawni Millers, MD  Admit date: 12/02/2019 Discharge date: 12/07/2019  Admitted From: Home Disposition: Home  Recommendations for Outpatient Follow-up:  1. Follow up with PCP in 1 week with repeat CBC/BMP 2. Outpatient follow-up with psychiatry and pain management 3. Recommend outpatient follow-up with GI as well 4. Follow up in ED if symptoms worsen or new appear   Home Health: No Equipment/Devices: None  Discharge Condition: Stable CODE STATUS: Full Diet recommendation: Heart healthy/carb modified  Brief/Interim Summary: 59 year old male with history of hepatic encephalopathy from alcohol abuse and HCV, cirrhosis of liver, diabetes mellitus type II, hypertension, chronic pain on pain medications and anxiety presented with acute respiratory failure with hypoxia likely related to drug overdose for which she was intubated and admitted to ICU.  He was also started on IV antibiotics for possible aspiration.  He was subsequently extubated, antibiotic switched to oral and transferred to medical floor.  [facility] took over his care from 12/06/2019 onwards.  Subsequently, mental status has remained stable.  Patient was to go home.  Once patient is seen by psychiatry today and if cleared by psychiatry, patient will be discharged home.  He will need outpatient follow-up with psychiatry/pain management at earliest convenience.  Recommend outpatient follow-up with GI as well.  Recommended patient not take wife's benzodiazepines on top of his own chronic pain meds.  Discharge Diagnoses:   Acute hypoxic respiratory failure secondary to drug overdose possible aspiration pneumonia -Patient was intubated on admission and extubated 2 days later.  He was treated with IV antibiotics as well which was switched to oral Augmentin. -Respiratory status is much improved.  He is currently on room air.  Will not need any more  antibiotics on discharge -Psychiatry evaluation today: If cleared by psychiatry, patient will be discharged home today.  Social worker was also consulted regarding drug overdose.  Outpatient follow-up with psychiatry.  Recommend that patient does not take wife's benzodiazepines on top of his own chronic pain meds.  Apparently, wife had reported that patient gets into her medications from time to time.  Patient denies intentional overdose. -Urine drug screen positive for benzos, methadone, opiates, tricyclics.  Acute toxic encephalopathy -Resolved in the setting of drug overdose.  Acute kidney injury -Likely from above.  Resolved.  Cirrhosis of liver -Compensated.  Continue lactulose and rifaximin.  Outpatient follow-up with GI/PCP  Diabetes mellitus type 2 uncontrolled with hyperglycemia next-A1c 7.3.  Resume home regimen.  Carb modified diet  Hypertension -Resume home regimen.  Chronic pain -Patient has been on chronic pain medications for chronic back pain and bilateral knee pain.  He follows up with a pain management specialist and apparently is on baclofen, methadone 10 mg 1 or 2 tablets 2-4 times a day, oxycodone 30 mg 3 times a day as needed along with amitriptyline.  Baclofen on hold for now.  Will defer dosing of oxycodone and methadone to outpatient pain management specialist.  Follow-up with pain management service list at earliest convenience on discharge.     Discharge Instructions  Discharge Instructions    Diet - low sodium heart healthy   Complete by: As directed    Diet Carb Modified   Complete by: As directed    Increase activity slowly   Complete by: As directed      Allergies as of 12/07/2019   No Active Allergies     Medication List    STOP taking these medications  baclofen 10 MG tablet Commonly known as: LIORESAL   guaifenesin 100 MG/5ML syrup Commonly known as: ROBITUSSIN     TAKE these medications   acetaminophen 500 MG tablet Commonly known  as: TYLENOL Take 500 mg by mouth as needed.   albuterol 108 (90 Base) MCG/ACT inhaler Commonly known as: VENTOLIN HFA SMARTSIG:2 Puff(s) By Mouth Every 4-6 Hours PRN   amitriptyline 50 MG tablet Commonly known as: ELAVIL 100 mg. TAKE 2 TABLETS BY MOUTH NIGHTLY   busPIRone 10 MG tablet Commonly known as: BUSPAR Take 10 mg by mouth 3 (three) times daily as needed.   Cholecalciferol 25 MCG (1000 UT) tablet Take by mouth.   hydrochlorothiazide 25 MG tablet Commonly known as: HYDRODIURIL TAKE ONE TABLET BY MOUTH EVERY DAY   insulin glargine 100 UNIT/ML Solostar Pen Commonly known as: Lantus SoloStar INJECT 50 UNITS UNDER THE SKIN AT BEDTIME   insulin glulisine 100 UNIT/ML injection Commonly known as: APIDRA Inject 0.35 mLs (35 Units total) into the skin 3 (three) times daily with meals.   lactulose 10 GM/15ML solution Commonly known as: CHRONULAC TAKE 30MLS BY MOUTH 3 TIMES A DAY   lisinopril 40 MG tablet Commonly known as: ZESTRIL TAKE ONE TABLET BY MOUTH EVERY DAY   loratadine 10 MG tablet Commonly known as: CLARITIN TAKE ONE TABLET BY MOUTH EVERY DAY   methadone 10 MG tablet Commonly known as: DOLOPHINE TAKE 1 OR 2 TABLETS BY MOUTH 3 TO 4 TIMES A DAY   naloxone 4 MG/0.1ML Liqd nasal spray kit Commonly known as: NARCAN Place into the nose.   oxycodone 30 MG immediate release tablet Commonly known as: ROXICODONE This is prescribed by his pain management team. TAKE ONE TABLET BY MOUTH THREE TIMES DAILY AS NEEDED FOR PAIN FOR 30 DAYS   rifaximin 550 MG Tabs tablet Commonly known as: XIFAXAN Take 550 mg by mouth 2 (two) times daily.       Follow-up Information    Tawni Millers, MD. Schedule an appointment as soon as possible for a visit in 1 week(s).   Specialty: Internal Medicine Why: with repeat cbc/bmp Contact information: [place] [address] [phone]        Psychiatrist. Schedule an appointment as soon as  possible for a visit in 1 week(s).        Pain management provider. Schedule an appointment as soon as possible for a visit in 1 week(s).              No Active Allergies  Consultations:  Psychiatry evaluation pending  PCCM   Procedures/Studies: CT Head Wo Contrast  Result Date: 12/02/2019 CLINICAL DATA:  Altered mental status. Found unresponsive by wife. Potential overdose. EXAM: CT HEAD WITHOUT CONTRAST TECHNIQUE: Contiguous axial images were obtained from the base of the skull through the vertex without intravenous contrast. COMPARISON:  Chest radiograph-earlier same day FINDINGS: Brain: Gray-white differentiation is maintained. No CT evidence of acute large territory infarct. No intraparenchymal or extra-axial mass or hemorrhage. Normal size and configuration of the ventricles and the basilar cisterns. Vascular: Intracranial atherosclerosis. Skull: No displaced calvarial fracture. Sinuses/Orbits: Scattered opacification of the right bilateral posterior ethmoidal air cells, right greater than left, likely the sequela of intubation. Mucosal thickening involving the right maxillary sinus. Rightward nasal septal deviation. Mastoid air cells appear well aerated. Other: Regional soft tissues appear normal. IMPRESSION: No acute intracranial process. Electronically Signed   By: Sandi Mariscal M.D.   On: 12/02/2019 09:57   DG  Chest Port 1 View  Result Date: 12/03/2019 CLINICAL DATA:  Acute respiratory failure EXAM: PORTABLE CHEST 1 VIEW COMPARISON:  12/02/2019 FINDINGS: Endotracheal tube with tip at the clavicular heads. The enteric tube reaches the distal stomach at least. Low lung volumes with infrahilar hazy density. No edema, effusion, or pneumothorax. Upper normal heart size. IMPRESSION: Low volume chest with presumed atelectasis at the bases. Stable hardware positioning. Electronically Signed   By: Monte Fantasia M.D.   On: 12/03/2019 07:50   DG Chest Portable 1 View  Result Date:  12/02/2019 CLINICAL DATA:  Hypoxia EXAM: PORTABLE CHEST 1 VIEW COMPARISON:  July 06, 2018 FINDINGS: Endotracheal tube tip is 2.3 cm above the carina. Nasogastric tube tip and side port are below the diaphragm. No pneumothorax. Mild elevation of the right hemidiaphragm is stable. The lungs are clear. The heart is upper normal in size with pulmonary vascularity normal. No adenopathy. No bone lesions. There is aortic atherosclerosis. IMPRESSION: Tube positions as described without pneumothorax. Mild elevation of right hemidiaphragm is stable. Lungs are clear. Heart upper normal in size. Aortic Atherosclerosis (ICD10-I70.0). Electronically Signed   By: Lowella Grip III M.D.   On: 12/02/2019 09:05   DG Abd Portable 1V  Result Date: 12/02/2019 CLINICAL DATA:  OG tube placement EXAM: PORTABLE ABDOMEN - 1 VIEW COMPARISON:  None FINDINGS: Gastric tube tip in the distal stomach, side port in the mid stomach. Visualized bowel gas pattern unremarkable but nonspecific as the entire abdomen is not imaged. No acute skeletal process. IMPRESSION: Gastric tube tip in the distal stomach, side port in the mid stomach. Electronically Signed   By: Zetta Bills M.D.   On: 12/02/2019 16:36   ECHOCARDIOGRAM COMPLETE  Result Date: 12/03/2019    ECHOCARDIOGRAM REPORT   Patient Name:   CHESNEY KLIMASZEWSKI Date of Exam: 12/02/2019 Medical Rec #:  [id number]      Height:       70.0 in Accession #:    [id number]     Weight:       267.9 lb Date of Birth:  19-May-1961      BSA:          2.363 m Patient Age:    11 years       BP:           126/75 mmHg Patient Gender: M              HR:           73 bpm. Exam Location:  [facility] Procedure: 2D Echo and Intracardiac Opacification Agent Indications:     CHF I50.31  History:         Patient has no prior history of Echocardiogram examinations.  Sonographer:     Arville Go RDCS Referring Phys:  [id number] BOFBPZ BRESCIA Diagnosing Phys: Kate Sable MD  Sonographer Comments: Technically  difficult study due to poor echo windows, suboptimal apical window and echo performed with patient supine and on artificial respirator. Image acquisition challenging due to patient body habitus. IMPRESSIONS  1. Left ventricular ejection fraction, by estimation, is 45 to 50%. The left ventricle has mildly decreased function. The left ventricle demonstrates global hypokinesis. Left ventricular diastolic parameters are indeterminate.  2. Right ventricular systolic function is normal. The right ventricular size is normal.  3. The mitral valve is normal in structure. No evidence of mitral valve regurgitation.  4. The aortic valve is tricuspid. Aortic valve regurgitation is not visualized. FINDINGS  Left Ventricle: Left ventricular ejection fraction,  by estimation, is 45 to 50%. The left ventricle has mildly decreased function. The left ventricle demonstrates global hypokinesis. Definity contrast agent was given IV to delineate the left ventricular  endocardial borders. The left ventricular internal cavity size was normal in size. There is no left ventricular hypertrophy. Left ventricular diastolic parameters are indeterminate. Right Ventricle: The right ventricular size is normal. No increase in right ventricular wall thickness. Right ventricular systolic function is normal. Left Atrium: Left atrial size was normal in size. Right Atrium: Right atrial size was normal in size. Pericardium: There is no evidence of pericardial effusion. Mitral Valve: The mitral valve is normal in structure. No evidence of mitral valve regurgitation. Tricuspid Valve: The tricuspid valve is normal in structure. Tricuspid valve regurgitation is mild. Aortic Valve: The aortic valve is tricuspid. Aortic valve regurgitation is not visualized. Aortic valve peak gradient measures 3.9 mmHg. Pulmonic Valve: The pulmonic valve was grossly normal. Pulmonic valve regurgitation is not visualized. Aorta: The aortic root is normal in size and structure.  Venous: The inferior vena cava was not well visualized. IAS/Shunts: No atrial level shunt detected by color flow Doppler.  LEFT VENTRICLE PLAX 2D LVIDd:         4.12 cm  Diastology LVIDs:         2.87 cm  LV e' lateral:   8.81 cm/s LV PW:         1.38 cm  LV E/e' lateral: 6.9 LV IVS:        1.28 cm  LV e' medial:    6.09 cm/s LVOT diam:     2.00 cm  LV E/e' medial:  10.0 LV SV:         40 LV SV Index:   17 LVOT Area:     3.14 cm  RIGHT VENTRICLE RV S prime:     10.90 cm/s TAPSE (M-mode): 1.9 cm LEFT ATRIUM           Index      RIGHT ATRIUM          Index LA diam:      3.30 cm 1.40 cm/m RA Area:     7.97 cm LA Vol (A4C): 18.0 ml 7.62 ml/m RA Volume:   15.70 ml 6.64 ml/m  AORTIC VALVE                PULMONIC VALVE AV Area (Vmax): 1.97 cm    PV Vmax:       0.87 m/s AV Vmax:        99.10 cm/s  PV Peak grad:  3.0 mmHg AV Peak Grad:   3.9 mmHg LVOT Vmax:      62.00 cm/s LVOT Vmean:     39.700 cm/s LVOT VTI:       0.127 m  AORTA Ao Root diam: 2.60 cm Ao Asc diam:  2.70 cm MITRAL VALVE               TRICUSPID VALVE MV Area (PHT): 3.53 cm    TV Peak grad:   13.7 mmHg MV Decel Time: 215 msec    TV Vmax:        1.85 m/s MV E velocity: 60.80 cm/s MV A velocity: 56.60 cm/s  SHUNTS MV E/A ratio:  1.07        Systemic VTI:  0.13 m                            Systemic  Diam: 2.00 cm Kate Sable MD Electronically signed by Kate Sable MD Signature Date/Time: 12/03/2019/10:48:36 AM    Final        Subjective: Patient seen and examined at bedside.  He feels much better and wants to go home today.  No fever, vomiting, worsening shortness of breath overnight. Discharge Exam: Vitals:   12/07/19 0753 12/07/19 1158  BP: (!) 179/88 (!) 193/90  Pulse: 83 81  Resp: (!) 22 16  Temp: 98.5 F (36.9 C) 98.3 F (36.8 C)  SpO2: 94% 97%    General: Pt is alert, awake, not in acute distress.  Poor historian.  Looks chronically ill  cardiovascular: rate controlled, S1/S2 + Respiratory: bilateral decreased breath  sounds at bases with some scattered crackles Abdominal: Soft, obese, NT, ND, bowel sounds + Extremities: Trace lower extremity edema; no cyanosis    The results of significant diagnostics from this hospitalization (including imaging, microbiology, ancillary and laboratory) are listed below for reference.     Microbiology: Recent Results (from the past 240 hour(s))  Blood Culture (routine x 2)     Status: None   Collection Time: 12/02/19  8:02 AM   Specimen: BLOOD  Result Value Ref Range Status   Specimen Description BLOOD BLOOD LEFT FOREARM  Final   Special Requests   Final    BOTTLES DRAWN AEROBIC AND ANAEROBIC Blood Culture adequate volume   Culture   Final    NO GROWTH 5 DAYS Performed at [facility], [address], [place], [place] [address]    Report Status 12/07/2019 FINAL  Final  Blood Culture (routine x 2)     Status: None   Collection Time: 12/02/19  8:02 AM   Specimen: BLOOD  Result Value Ref Range Status   Specimen Description BLOOD BLOOD RIGHT HAND  Final   Special Requests   Final    BOTTLES DRAWN AEROBIC AND ANAEROBIC Blood Culture adequate volume   Culture   Final    NO GROWTH 5 DAYS Performed at [facility], [address] [place], [place] [address]    Report Status 12/07/2019 FINAL  Final  SARS Coronavirus 2 by RT PCR (hospital order, performed in [facility] hospital lab) Nasopharyngeal Nasopharyngeal Swab     Status: None   Collection Time: 12/02/19  8:22 AM   Specimen: Nasopharyngeal Swab  Result Value Ref Range Status   SARS Coronavirus 2 NEGATIVE NEGATIVE Final    Comment: (NOTE) SARS-CoV-2 target nucleic acids are NOT DETECTED.  The SARS-CoV-2 RNA is generally detectable in upper and lower respiratory specimens during the acute phase of infection. The lowest concentration of SARS-CoV-2 viral copies this assay can detect is 250 copies / mL. A negative result does not preclude SARS-CoV-2 infection and should not be used as  the sole basis for treatment or other patient management decisions.  A negative result may occur with improper specimen collection / handling, submission of specimen other than nasopharyngeal swab, presence of viral mutation(s) within the areas targeted by this assay, and inadequate number of viral copies (<250 copies / mL). A negative result must be combined with clinical observations, patient history, and epidemiological information.  Fact Sheet for Patients:   StrictlyIdeas.no  Fact Sheet for Healthcare Providers: BankingDealers.co.za  This test is not yet approved or  cleared by the [place] FDA and has been authorized for detection and/or diagnosis of SARS-CoV-2 by FDA under an Emergency Use Authorization (EUA).  This EUA will remain in effect (meaning this test can  be used) for the duration of the COVID-19 declaration under Section 564(b)(1) of the Act, 21 U.S.C. section 360bbb-3(b)(1), unless the authorization is terminated or revoked sooner.  Performed at [facility], [address], [place], [place] [address]   Urine culture     Status: None   Collection Time: 12/02/19  9:15 AM   Specimen: In/Out Cath Urine  Result Value Ref Range Status   Specimen Description   Final    IN/OUT CATH URINE Performed at [facility], [address], [place], [place] [address]    Special Requests   Final    NONE Performed at [facility], [address] [place], [place] [address]    Culture   Final    NO GROWTH Performed at [facility], [place] [address] [place], [place] [address]    Report Status 12/03/2019 FINAL  Final  MRSA PCR Screening     Status: Abnormal   Collection Time: 12/02/19 12:42 PM   Specimen: Nasopharyngeal  Result Value Ref Range Status   MRSA by PCR POSITIVE (A) NEGATIVE Final    Comment:        The GeneXpert MRSA Assay (FDA approved for NASAL specimens only), is one  component of a comprehensive MRSA colonization surveillance program. It is not intended to diagnose MRSA infection nor to guide or monitor treatment for MRSA infections. RESULT CALLED TO, READ BACK BY AND VERIFIED WITH: [facility] MOORE AT 8264 12/02/19.PMF Performed at [facility], [address] [place], [place] [address]      Labs: BNP (last 3 results) No results for input(s): BNP in the last 8760 hours. Basic Metabolic Panel: Recent Labs  Lab 12/02/19 0802 12/02/19 0802 12/02/19 1412 12/03/19 0516 12/04/19 0556 12/05/19 0432 12/07/19 0737  NA 136  --   --  141 143 144 140  K 4.4  --   --  3.8 4.5 3.4* 3.7  CL 100  --   --  108 110 108 104  CO2 22  --   --  _0 GLUCOSE 359*  --   --  179* 177* 108* 167*  BUN 27*  --   --  26* 25* 20 16  CREATININE 1.86*   < > 1.82* 1.11 1.05 0.77 0.77  CALCIUM 9.4  --   --  8.0* 8.0* 7.9* 8.5*  MG  --   --   --   --   --  1.5* 1.8  PHOS  --   --   --   --   --  3.0  --    < > = values in this interval not displayed.   Liver Function Tests: Recent Labs  Lab 12/02/19 0802 12/03/19 0516 12/04/19 0556 12/05/19 0432  AST 88* 101* 196* 111*  ALT 62* 89* 191* 158*  ALKPHOS 71 54 50 46  BILITOT 0.9 0.8 1.2 1.1  PROT 7.7 6.0* 6.4* 5.9*  ALBUMIN 4.0 3.0* 3.0* 2.8*   No results for input(s): LIPASE, AMYLASE in the last 168 hours. Recent Labs  Lab 12/02/19 1412  AMMONIA 23   CBC: Recent Labs  Lab 12/02/19 0802 12/02/19 0802 12/02/19 1412 12/03/19 0516 12/04/19 0556 12/05/19 0432 12/07/19 0737  WBC 10.9*   < > 5.9 4.3 4.5 3.5* 4.6  NEUTROABS 9.5*  --   --   --   --   --   --   HGB 14.6   < > 12.8* 12.3* 13.1 12.1* 14.2  HCT 42.2   < > 38.1* 36.4* 39.1 35.5* 39.1  MCV 89.4   < > 91.4 90.5 90.3 89.0 85.0  PLT 123*   < > 86* 75* 72* 65* 87*   < > = values in this interval not displayed.   Cardiac Enzymes: No results for input(s): CKTOTAL, CKMB, CKMBINDEX, TROPONINI in the last 168 hours. BNP: Invalid  input(s): POCBNP CBG: Recent Labs  Lab 12/06/19 0725 12/06/19 1144 12/06/19 1608 12/07/19 0755 12/07/19 1158  GLUCAP 131* 231* 191* 138* 198*   D-Dimer No results for input(s): DDIMER in the last 72 hours. Hgb A1c No results for input(s): HGBA1C in the last 72 hours. Lipid Profile No results for input(s): CHOL, HDL, LDLCALC, TRIG, CHOLHDL, LDLDIRECT in the last 72 hours. Thyroid function studies No results for input(s): TSH, T4TOTAL, T3FREE, THYROIDAB in the last 72 hours.  Invalid input(s): FREET3 Anemia work up No results for input(s): VITAMINB12, FOLATE, FERRITIN, TIBC, IRON, RETICCTPCT in the last 72 hours. Urinalysis    Component Value Date/Time   COLORURINE AMBER (A) 12/02/2019 0915   APPEARANCEUR HAZY (A) 12/02/2019 0915   APPEARANCEUR Clear 08/30/2019 1036   LABSPEC 1.016 12/02/2019 0915   PHURINE 5.0 12/02/2019 0915   GLUCOSEU >=500 (A) 12/02/2019 0915   HGBUR NEGATIVE 12/02/2019 0915   BILIRUBINUR NEGATIVE 12/02/2019 0915   BILIRUBINUR Negative 08/30/2019 [place] 12/02/2019 0915   PROTEINUR 30 (A) 12/02/2019 0915   NITRITE NEGATIVE 12/02/2019 0915   LEUKOCYTESUR NEGATIVE 12/02/2019 0915   Sepsis Labs Invalid input(s): PROCALCITONIN,  WBC,  LACTICIDVEN Microbiology Recent Results (from the past 240 hour(s))  Blood Culture (routine x 2)     Status: None   Collection Time: 12/02/19  8:02 AM   Specimen: BLOOD  Result Value Ref Range Status   Specimen Description BLOOD BLOOD LEFT FOREARM  Final   Special Requests   Final    BOTTLES DRAWN AEROBIC AND ANAEROBIC Blood Culture adequate volume   Culture   Final    NO GROWTH 5 DAYS Performed at [facility], [address], [place], [place] [address]    Report Status 12/07/2019 FINAL  Final  Blood Culture (routine x 2)     Status: None   Collection Time: 12/02/19  8:02 AM   Specimen: BLOOD  Result Value Ref Range Status   Specimen Description BLOOD BLOOD RIGHT HAND  Final    Special Requests   Final    BOTTLES DRAWN AEROBIC AND ANAEROBIC Blood Culture adequate volume   Culture   Final    NO GROWTH 5 DAYS Performed at [facility], [address], [place], [place] [address]    Report Status 12/07/2019 FINAL  Final  SARS Coronavirus 2 by RT PCR (hospital order, performed in [facility] hospital lab) Nasopharyngeal Nasopharyngeal Swab     Status: None   Collection Time: 12/02/19  8:22 AM   Specimen: Nasopharyngeal Swab  Result Value Ref Range Status   SARS Coronavirus 2 NEGATIVE NEGATIVE Final    Comment: (NOTE) SARS-CoV-2 target nucleic acids are NOT DETECTED.  The SARS-CoV-2 RNA is generally detectable in upper and lower respiratory specimens during the acute phase of infection. The lowest concentration of SARS-CoV-2 viral copies this assay can detect is 250 copies / mL. A negative result does not preclude SARS-CoV-2 infection and should not be used as the sole basis for treatment or other patient management decisions.  A negative result may occur with improper specimen collection / handling, submission  of specimen other than nasopharyngeal swab, presence of viral mutation(s) within the areas targeted by this assay, and inadequate number of viral copies (<250 copies / mL). A negative result must be combined with clinical observations, patient history, and epidemiological information.  Fact Sheet for Patients:   StrictlyIdeas.no  Fact Sheet for Healthcare Providers: BankingDealers.co.za  This test is not yet approved or  cleared by the [place] FDA and has been authorized for detection and/or diagnosis of SARS-CoV-2 by FDA under an Emergency Use Authorization (EUA).  This EUA will remain in effect (meaning this test can be used) for the duration of the COVID-19 declaration under Section 564(b)(1) of the Act, 21 U.S.C. section 360bbb-3(b)(1), unless the authorization is terminated or revoked  sooner.  Performed at [facility], [address], [place], [place] [address]   Urine culture     Status: None   Collection Time: 12/02/19  9:15 AM   Specimen: In/Out Cath Urine  Result Value Ref Range Status   Specimen Description   Final    IN/OUT CATH URINE Performed at [facility], [address], [place], [place] [address]    Special Requests   Final    NONE Performed at [facility], [address], [place], [place] [address]    Culture   Final    NO GROWTH Performed at [facility], [place] [address], [place], [place] [address]    Report Status 12/03/2019 FINAL  Final  MRSA PCR Screening     Status: Abnormal   Collection Time: 12/02/19 12:42 PM   Specimen: Nasopharyngeal  Result Value Ref Range Status   MRSA by PCR POSITIVE (A) NEGATIVE Final    Comment:        The GeneXpert MRSA Assay (FDA approved for NASAL specimens only), is one component of a comprehensive MRSA colonization surveillance program. It is not intended to diagnose MRSA infection nor to guide or monitor treatment for MRSA infections. RESULT CALLED TO, READ BACK BY AND VERIFIED WITH: [facility] MOORE AT 4619 12/02/19.PMF Performed at [facility], [address] [place], [place] [address]      Time coordinating discharge: 35 minutes  SIGNED:   Aline August, MD  [facility] 12/07/2019, 1:49 PM

## 2019-12-07 NOTE — Evaluation (Signed)
Physical Therapy Evaluation Patient Details Name: Michael Mack MRN: [id number] DOB: 1960-10-17 Today's Date: 12/07/2019   History of Present Illness  59 y.o. 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  He was intubated for 2 days, extubated 6/14.  Clinical Impression  Pt feeling better and states he feels close to his baseline, eager to go home.  He easily got up to sitting, stood w/o issue and was able to confidently circumambulate the nurses' station (w/o AD or assist) and was able to maintain O2 in the 90s on room air.  Pt is appropriate and safe to return home from a PT perspective, no PT f/u needs once medically cleared for d/c.     Follow Up Recommendations No PT follow up    Equipment Recommendations  None recommended by PT    Recommendations for Other Services       Precautions / Restrictions Precautions Precautions: None Restrictions Weight Bearing Restrictions: No      Mobility  Bed Mobility Overal bed mobility: Independent                Transfers Overall transfer level: Independent Equipment used: None             General transfer comment: easily gets to standing w/o assist, w/o hesitation  Ambulation/Gait Ambulation/Gait assistance: Modified independent (Device/Increase time) Gait Distance (Feet): 200 Feet Assistive device: None       General Gait Details: Pt was able to confidently circumambulate the nurses' station with consistent speed, cadence and no safety concerns.  O2 remained in the 90s and HR WNL with some increased with activity.    Stairs            Wheelchair Mobility    Modified Rankin (Stroke Patients Only)       Balance Overall balance assessment: Modified Independent                                            Pertinent Vitals/Pain Pain Assessment: No/denies pain    Home Living Family/patient expects to be discharged to:: Private residence Living Arrangements: Spouse/significant other Available Help at Discharge: Available PRN/intermittently (wife does work/out of home t/o the day)   Home Access: Ramped entrance       Home Equipment: Environmental consultant - 2 wheels;Cane - single point      Prior Function Level of Independence: Independent               Hand Dominance        Extremity/Trunk Assessment   Upper Extremity Assessment Upper Extremity Assessment: Overall WFL for tasks assessed    Lower Extremity Assessment Lower Extremity Assessment: Overall WFL for tasks assessed       Communication   Communication: No difficulties  Cognition Arousal/Alertness: Awake/alert Behavior During Therapy: WFL for tasks assessed/performed Overall Cognitive Status: Within Functional Limits for tasks assessed                                        General Comments General comments (skin integrity, edema, etc.): Pt feeling slightly weak, but essentially back to baseline  Exercises     Assessment/Plan    PT Assessment Patent does not need any further PT services  PT Problem List         PT Treatment Interventions      PT Goals (Current goals can be found in the Care Plan section)       Frequency     Barriers to discharge        Co-evaluation               AM-PAC PT "6 Clicks" Mobility  Outcome Measure Help needed turning from your back to your side while in a flat bed without using bedrails?: None Help needed moving from lying on your back to sitting on the side of a flat bed without using bedrails?: None Help needed moving to and from a bed to a chair (including a wheelchair)?: None Help needed standing up from a chair using your arms (e.g., wheelchair or bedside chair)?: None Help needed to walk in hospital  room?: None Help needed climbing 3-5 steps with a railing? : None 6 Click Score: 24    End of Session Equipment Utilized During Treatment: Gait belt Activity Tolerance: Patient tolerated treatment well Patient left: with chair alarm set;with call bell/phone within reach   PT Visit Diagnosis: Muscle weakness (generalized) (M62.81);Difficulty in walking, not elsewhere classified (R26.2)    Time: 3220-2542 PT Time Calculation (min) (ACUTE ONLY): 14 min   Charges:   PT Evaluation $PT Eval Low Complexity: 1 Low          Kreg Shropshire, DPT 12/07/2019, 10:21 AM

## 2019-12-07 NOTE — Consult Note (Signed)
[place] [facility] Consult   Reason for Consult:  Possible SI /OD Referring Physician:  IM team Patient Identification: Michael Mack MRN:  [id number] Principal Diagnosis: Acute respiratory failure with hypoxia ([place])   Accidental (by histroy and testimony) ingestion of tablets   History of chronic pain  History of past opioid dependence    Diagnosis:  Principal Problem:   Acute respiratory failure with hypoxia ([place]) Active Problems:   Diabetes ([place])   GERD (gastroesophageal reflux disease)   Hypertension   Cirrhosis ([place])   Acute metabolic encephalopathy   Aspiration into lower respiratory tract   Acute kidney injury ([place])   Chronic pain   Total Time spent with patient: 20-30 min Subjective:   Michael Mack is a 59 y.o. male patient admitted with  Respiratory failure and various metabolic issues , post accidental ingestion of medicines.       HPI:  59 year old male with history of above issues s/p OD of several medications.    One or two weeks PTA he was already feeling nauseated, tired, listless and had a headache.  He has cirrhosis reflux, acute kidney injury and DM which all combined into metabolic encephalopapthy and acute respiratory failure after he accidentally took a few of his wife's xanax and his own tablets including methadone. --He felt it was the confusion of his metabolic issues prior to taking the tablets that was the problem and an intentional OD or suicide attempt.    His wife seems to concur  He has had past opioid dependence from chronic pain and now is on methadone regularly anyway followed chronic pain clinic.   He has no previous attempts or other past psych problems   Past Psychiatric History:  Risk to Self:  minimal  Risk to Others:  none  Prior Inpatient Therapy:  none  Prior Outpatient Therapy:  none specifically for Psych   Past Medical History:  Past Medical History:  Diagnosis Date  . Bulging lumbar disc   . Diabetes  mellitus without complication ([place])   . GERD (gastroesophageal reflux disease)   . Hepatitis C   . Hypertension   . Migraines     Past Surgical History:  Procedure Laterality Date  . KNEE SURGERY     Right Knee x 3, Left Knee x 1   Family History:  Family History  Problem Relation Age of Onset  . Cancer Mother    Family Psychiatric  History:  None significant Social History:  Social History   Substance and Sexual Activity  Alcohol Use No     Social History   Substance and Sexual Activity  Drug Use No    Social History   Socioeconomic History  . Marital status: Married    Spouse name: Shreyan Hinz  . Number of children: 0  . Years of education: Not on file  . Highest education level: 8th grade  Occupational History  . Occupation: unemployed  Tobacco Use  . Smoking status: Never Smoker  . Smokeless tobacco: Never Used  Substance and Sexual Activity  . Alcohol use: No  . Drug use: No  . Sexual activity: Yes  Other Topics Concern  . Not on file  Social History Narrative   Dental problems- Already went to [facility] dental care program that we referred him too. Visited [facility] program but was too expensive.   Social Determinants of Health   Financial Resource Strain:   . Difficulty of Paying Living Expenses:   Food Insecurity:   .  Worried About Charity fundraiser in the Last Year:   . Arboriculturist in the Last Year:   Transportation Needs:   . Film/video editor (Medical):   Marland Kitchen Lack of Transportation (Non-Medical):   Physical Activity:   . Days of Exercise per Week:   . Minutes of Exercise per Session:   Stress:   . Feeling of Stress :   Social Connections:   . Frequency of Communication with Friends and Family:   . Frequency of Social Gatherings with Friends and Family:   . Attends Religious Services:   . Active Member of Clubs or Organizations:   . Attends Archivist Meetings:   Marland Kitchen Marital Status:    Additional Social History:     Allergies:  No Active Allergies  Labs:  Results for orders placed or performed during the hospital encounter of 12/02/19 (from the past 48 hour(s))  Glucose, capillary     Status: Abnormal   Collection Time: 12/05/19  5:07 PM  Result Value Ref Range   Glucose-Capillary 168 (H) 70 - 99 mg/dL    Comment: Glucose reference range applies only to samples taken after fasting for at least 8 hours.  Glucose, capillary     Status: Abnormal   Collection Time: 12/05/19  9:42 PM  Result Value Ref Range   Glucose-Capillary 155 (H) 70 - 99 mg/dL    Comment: Glucose reference range applies only to samples taken after fasting for at least 8 hours.  Glucose, capillary     Status: Abnormal   Collection Time: 12/06/19  7:25 AM  Result Value Ref Range   Glucose-Capillary 131 (H) 70 - 99 mg/dL    Comment: Glucose reference range applies only to samples taken after fasting for at least 8 hours.  Glucose, capillary     Status: Abnormal   Collection Time: 12/06/19 11:44 AM  Result Value Ref Range   Glucose-Capillary 231 (H) 70 - 99 mg/dL    Comment: Glucose reference range applies only to samples taken after fasting for at least 8 hours.  Glucose, capillary     Status: Abnormal   Collection Time: 12/06/19  4:08 PM  Result Value Ref Range   Glucose-Capillary 191 (H) 70 - 99 mg/dL    Comment: Glucose reference range applies only to samples taken after fasting for at least 8 hours.  CBC     Status: Abnormal   Collection Time: 12/07/19  7:37 AM  Result Value Ref Range   WBC 4.6 4.0 - 10.5 K/uL   RBC 4.60 4.22 - 5.81 MIL/uL   Hemoglobin 14.2 13.0 - 17.0 g/dL   HCT 39.1 39 - 52 %   MCV 85.0 80.0 - 100.0 fL   MCH 30.9 26.0 - 34.0 pg   MCHC 36.3 (H) 30.0 - 36.0 g/dL   RDW 13.5 11.5 - 15.5 %   Platelets 87 (L) 150 - 400 K/uL    Comment: Immature Platelet Fraction may be clinically indicated, consider ordering this additional test [id number]    nRBC 0.0 0.0 - 0.2 %    Comment: Performed at [facility], [address] [place], [place] [address]  Basic metabolic panel     Status: Abnormal   Collection Time: 12/07/19  7:37 AM  Result Value Ref Range   Sodium 140 135 - 145 mmol/L   Potassium 3.7 3.5 - 5.1 mmol/L    Comment: HEMOLYSIS AT THIS LEVEL MAY AFFECT RESULT   Chloride 104 98 -  111 mmol/L   CO2 25 22 - 32 mmol/L   Glucose, Bld 167 (H) 70 - 99 mg/dL    Comment: Glucose reference range applies only to samples taken after fasting for at least 8 hours.   BUN 16 6 - 20 mg/dL   Creatinine, Ser 0.77 0.61 - 1.24 mg/dL   Calcium 8.5 (L) 8.9 - 10.3 mg/dL   GFR calc non Af Amer >60 >60 mL/min   GFR calc Af Amer >60 >60 mL/min   Anion gap 11 5 - 15    Comment: Performed at [facility], [address] [place], [place] [address]  Magnesium     Status: None   Collection Time: 12/07/19  7:37 AM  Result Value Ref Range   Magnesium 1.8 1.7 - 2.4 mg/dL    Comment: Performed at [facility], [address] [place],  [address]  Glucose, capillary     Status: Abnormal   Collection Time: 12/07/19  7:55 AM  Result Value Ref Range   Glucose-Capillary 138 (H) 70 - 99 mg/dL    Comment: Glucose reference range applies only to samples taken after fasting for at least 8 hours.  Glucose, capillary     Status: Abnormal   Collection Time: 12/07/19 11:58 AM  Result Value Ref Range   Glucose-Capillary 198 (H) 70 - 99 mg/dL    Comment: Glucose reference range applies only to samples taken after fasting for at least 8 hours.    Current Facility-Administered Medications  Medication Dose Route Frequency Provider Last Rate Last Admin  . acetaminophen (TYLENOL) tablet 650 mg  650 mg Oral Q6H PRN Flora Lipps, MD   650 mg at 12/07/19 1123  . amoxicillin-clavulanate (AUGMENTIN) 875-125 MG per tablet 1 tablet  1 tablet Oral Q12H Flora Lipps, MD   1 tablet at 12/07/19 0903  . docusate sodium (COLACE) capsule 100 mg  100 mg Oral BID PRN Nelle Don, MD      . heparin  injection 5,000 Units  5,000 Units Subcutaneous Q8H Nelle Don, MD   5,000 Units at 12/07/19 1353  . hydrochlorothiazide (HYDRODIURIL) tablet 25 mg  25 mg Oral Daily Radene Gunning, NP   25 mg at 12/07/19 0903  . insulin aspart (novoLOG) injection 0-15 Units  0-15 Units Subcutaneous TID WC Benita Gutter, RPH   3 Units at 12/07/19 1353  . lactulose (CHRONULAC) 10 GM/15ML solution 20 g  20 g Oral TID Flora Lipps, MD   20 g at 12/07/19 0903  . lisinopril (ZESTRIL) tablet 40 mg  40 mg Oral Daily Flora Lipps, MD   40 mg at 12/07/19 0903  . MEDLINE mouth rinse  15 mL Mouth Rinse BID Awilda Bill, NP      . pantoprazole (PROTONIX) EC tablet 40 mg  40 mg Oral Daily Benita Gutter, RPH   40 mg at 12/07/19 0904  . polyethylene glycol (MIRALAX / GLYCOLAX) packet 17 g  17 g Oral Daily PRN Nelle Don, MD      . rifaximin Doreene Nest) tablet 550 mg  550 mg Oral BID Flora Lipps, MD   550 mg at 12/07/19 0903  . traZODone (DESYREL) tablet 50 mg  50 mg Oral QHS PRN Bradly Bienenstock, NP   50 mg at 12/06/19 2138    Musculoskeletal: Strength & Muscle Tone: returning in tone and all  Gait & Station: walks with assistance  Patient leans: n/a   Psychiatric Specialty Exam: Physical Exam  Review of Systems  Blood pressure (!) 193/90, pulse 81, temperature 98.3 F (36.8 C), temperature source Oral, resp. rate 16, height 5\' 10"  (1.778 m), weight 124.3 kg, SpO2 97 %.Body mass index is 39.32 kg/m.  General Appearance: at rest older than stated age overweight   Eye Contact:  Normal   Speech:  Normal   Volume:  Normal   Mood:  Normal slightly quiet   Affect:  Normal   Thought Process:  Normal   Orientation:  Oriented times four   Thought Content:  Normal   Suicidal Thoughts:  None   Homicidal Thoughts:  None   Memory:  Remote recent immediate intact through general questions   Judgement:  Fair    Inssght  Fair   Psychomotor Activity:  Normal   Concentration:  Normal   Recall:  Normal    Fund of Knowledge:  Fair    Language:  Normal   Akathisia:  None   Handed:  right  AIMS (if indicated):     Assets:  Supportive family   ADL's:  About the same at home   Cognition:  No new change   Sleep:        Treatment Plan Summary:   Caucasian male now ready for discharge post accidental OD he says and wife says ----in background of already evolving metabolic problems with symptoms one or two weeks prior  Main issues is opioid dependence where he will continue methadone---doses being reduced as that may have contributed along with taking some of his wife's benzodiazepines   At this time he does not meet inpatient IVC or inpatient criteria, contracts for safety and wants to go home  No other psych med changes now all can be done as an outpatient   Tried to make general supportive statements overall.   Disposition:  He can go home --at this time from psych point of view   Eulas Post, MD 12/07/2019 2:48 PM

## 2019-12-13 ENCOUNTER — Telehealth: Payer: Self-pay | Admitting: Internal Medicine

## 2019-12-13 ENCOUNTER — Ambulatory Visit: Payer: Self-pay | Admitting: Internal Medicine

## 2019-12-13 ENCOUNTER — Other Ambulatory Visit: Payer: Self-pay

## 2019-12-13 DIAGNOSIS — I10 Essential (primary) hypertension: Secondary | ICD-10-CM

## 2019-12-13 DIAGNOSIS — R400 Somnolence: Secondary | ICD-10-CM

## 2019-12-13 DIAGNOSIS — G8929 Other chronic pain: Secondary | ICD-10-CM

## 2019-12-13 NOTE — Progress Notes (Signed)
Established Patient Office Visit  Subjective:  Patient ID: Michael Mack, male    DOB: 05/20/1961  Age: 59 y.o. MRN: [id number]  CC: No chief complaint on file.   HPI AB LEAMING presents for follow up after admission to [facility] for mental status change.   Past Medical History:  Diagnosis Date   Bulging lumbar disc    Diabetes mellitus without complication (HCC)    GERD (gastroesophageal reflux disease)    Hepatitis C    Hypertension    Migraines     Past Surgical History:  Procedure Laterality Date   KNEE SURGERY     Right Knee x 3, Left Knee x 1    Family History  Problem Relation Age of Onset   Cancer Mother     Social History   Socioeconomic History   Marital status: Married    Spouse name: Gus Littler   Number of children: 0   Years of education: Not on file   Highest education level: 8th grade  Occupational History   Occupation: unemployed  Tobacco Use   Smoking status: Never Smoker   Smokeless tobacco: Never Used  Substance and Sexual Activity   Alcohol use: No   Drug use: No   Sexual activity: Yes  Other Topics Concern   Not on file  Social History Narrative   Dental problems- Already went to [place] dental care program that we referred him too. Visited [facility] program but was too expensive.   Social Determinants of Health   Financial Resource Strain:    Difficulty of Paying Living Expenses:   Food Insecurity:    Worried About Charity fundraiser in the Last Year:    Arboriculturist in the Last Year:   Transportation Needs:    Film/video editor (Medical):    Lack of Transportation (Non-Medical):   Physical Activity:    Days of Exercise per Week:    Minutes of Exercise per Session:   Stress:    Feeling of Stress :   Social Connections:    Frequency of Communication with Friends and Family:    Frequency of Social Gatherings with Friends and Family:    Attends Religious Services:    Active Member of  Clubs or Organizations:    Attends Music therapist:    Marital Status:   Intimate Partner Violence:    Fear of Current or Ex-Partner:    Emotionally Abused:    Physically Abused:    Sexually Abused:     Outpatient Medications Prior to Visit  Medication Sig Dispense Refill   acetaminophen (TYLENOL) 500 MG tablet Take 500 mg by mouth as needed.     albuterol (VENTOLIN HFA) 108 (90 Base) MCG/ACT inhaler SMARTSIG:2 Puff(s) By Mouth Every 4-6 Hours PRN     amitriptyline (ELAVIL) 50 MG tablet 100 mg. TAKE 2 TABLETS BY MOUTH NIGHTLY     busPIRone (BUSPAR) 10 MG tablet Take 10 mg by mouth 3 (three) times daily as needed.     Cholecalciferol 25 MCG (1000 UT) tablet Take by mouth.     hydrochlorothiazide (HYDRODIURIL) 25 MG tablet TAKE ONE TABLET BY MOUTH EVERY DAY (Patient taking differently: Take 25 mg by mouth daily. ) 90 tablet 0   Insulin Glargine (LANTUS SOLOSTAR) 100 UNIT/ML Solostar Pen INJECT 50 UNITS UNDER THE SKIN AT BEDTIME 30 mL 2   insulin glulisine (APIDRA) 100 UNIT/ML injection Inject 0.35 mLs (35 Units total) into the skin 3 (three) times daily  with meals. 10 mL 27   lactulose (CHRONULAC) 10 GM/15ML solution TAKE 30MLS BY MOUTH 3 TIMES A DAY 946 mL 0   lisinopril (ZESTRIL) 40 MG tablet TAKE ONE TABLET BY MOUTH EVERY DAY 60 tablet 0   loratadine (CLARITIN) 10 MG tablet TAKE ONE TABLET BY MOUTH EVERY DAY 30 tablet 0   methadone (DOLOPHINE) 10 MG tablet TAKE 1 OR 2 TABLETS BY MOUTH 3 TO 4 TIMES A DAY     naloxone (NARCAN) nasal spray 4 mg/0.1 mL Place into the nose.     oxycodone (ROXICODONE) 30 MG immediate release tablet This is prescribed by his pain management team. TAKE ONE TABLET BY MOUTH THREE TIMES DAILY AS NEEDED FOR PAIN FOR 30 DAYS     rifaximin (XIFAXAN) 550 MG TABS tablet Take 550 mg by mouth 2 (two) times daily.      No facility-administered medications prior to visit.    No Active Allergies  ROS Review of Systems     Objective:    Physical Exam  There were no vitals taken for this visit. Wt Readings from Last 3 Encounters:  12/05/19 274 lb 0.5 oz (124.3 kg)  08/30/19 267 lb 3.2 oz (121.2 kg)  02/22/19 278 lb 4.8 oz (126.2 kg)     Health Maintenance Due  Topic Date Due   FOOT EXAM  Never done   COLONOSCOPY  Never done   OPHTHALMOLOGY EXAM  07/15/2019    There are no preventive care reminders to display for this patient.  Lab Results  Component Value Date   TSH 2.580 07/28/2017   Lab Results  Component Value Date   WBC 4.6 12/07/2019   HGB 14.2 12/07/2019   HCT 39.1 12/07/2019   MCV 85.0 12/07/2019   PLT 87 (L) 12/07/2019   Lab Results  Component Value Date   NA 140 12/07/2019   K 3.7 12/07/2019   CO2 25 12/07/2019   GLUCOSE 167 (H) 12/07/2019   BUN 16 12/07/2019   CREATININE 0.77 12/07/2019   BILITOT 1.1 12/05/2019   ALKPHOS 46 12/05/2019   AST 111 (H) 12/05/2019   ALT 158 (H) 12/05/2019   PROT 5.9 (L) 12/05/2019   ALBUMIN 2.8 (L) 12/05/2019   CALCIUM 8.5 (L) 12/07/2019   ANIONGAP 11 12/07/2019   Lab Results  Component Value Date   CHOL 156 11/09/2018   Lab Results  Component Value Date   HDL 49 11/09/2018   Lab Results  Component Value Date   LDLCALC 81 11/09/2018   Lab Results  Component Value Date   TRIG 98 12/04/2019   Lab Results  Component Value Date   CHOLHDL 3.2 11/09/2018   Lab Results  Component Value Date   HGBA1C 7.3 (H) 11/29/2019      Assessment & Plan:   Problem List Items Addressed This Visit      Cardiovascular and Mediastinum   Hypertension     Other   Chronic pain - Primary    Altered Mental Status Patient recently admitted to [facility] with severe altered mental status required ICU care and intubation.  Appeared to have overdosed on taking partners medication, Xanax making him sombulant. Patient has chronic pain syndrome and takes methadone for control.Also underlying chronic liver cirrhosis.  Apparently taking his wife's  medication as supplement, triggered severed deterioration in mental status and sombulance.  Since hospitalization he is stating to be doing well.  Discharging physician from ER requested follow up labs to confirm stability.   Labs have been scheduled  for July 1. FU apt also scheduled July 14.    No orders of the defined types were placed in this encounter.   Follow-up: No follow-ups on file.    Ciana Simmon Charyl Bigger

## 2019-12-20 NOTE — Telephone Encounter (Signed)
appts made.

## 2019-12-21 ENCOUNTER — Other Ambulatory Visit: Payer: Self-pay

## 2019-12-21 DIAGNOSIS — I10 Essential (primary) hypertension: Secondary | ICD-10-CM

## 2019-12-22 LAB — URINALYSIS, ROUTINE W REFLEX MICROSCOPIC
Bilirubin, UA: NEGATIVE
Glucose, UA: NEGATIVE
Leukocytes,UA: NEGATIVE
Nitrite, UA: NEGATIVE
RBC, UA: NEGATIVE
Specific Gravity, UA: 1.022 (ref 1.005–1.030)
Urobilinogen, Ur: 1 mg/dL (ref 0.2–1.0)
pH, UA: 6 (ref 5.0–7.5)

## 2019-12-22 LAB — MICROSCOPIC EXAMINATION
Bacteria, UA: NONE SEEN
Casts: NONE SEEN /lpf

## 2019-12-22 LAB — HEMOGLOBIN A1C
Est. average glucose Bld gHb Est-mCnc: 157 mg/dL
Hgb A1c MFr Bld: 7.1 % — ABNORMAL HIGH (ref 4.8–5.6)

## 2020-01-01 ENCOUNTER — Telehealth: Payer: Self-pay | Admitting: Pharmacist

## 2020-01-01 NOTE — Telephone Encounter (Signed)
01/01/2020 10:56:41 AM - Lantus Solostar refill to [facility] - Monday, January 01, 2020 10:56 AM --Michael Mack [employer] refill request for Lantus Solostar Inject 50 units daily at bedtime # 3 boxes.

## 2020-01-03 ENCOUNTER — Encounter: Payer: Self-pay | Admitting: Internal Medicine

## 2020-01-03 ENCOUNTER — Other Ambulatory Visit: Payer: Self-pay

## 2020-01-03 ENCOUNTER — Ambulatory Visit: Payer: Self-pay | Admitting: Internal Medicine

## 2020-01-03 DIAGNOSIS — E119 Type 2 diabetes mellitus without complications: Secondary | ICD-10-CM

## 2020-01-03 DIAGNOSIS — G8929 Other chronic pain: Secondary | ICD-10-CM

## 2020-01-03 DIAGNOSIS — Z794 Long term (current) use of insulin: Secondary | ICD-10-CM

## 2020-01-03 DIAGNOSIS — I10 Essential (primary) hypertension: Secondary | ICD-10-CM

## 2020-01-03 DIAGNOSIS — J9601 Acute respiratory failure with hypoxia: Secondary | ICD-10-CM

## 2020-01-03 DIAGNOSIS — K7460 Unspecified cirrhosis of liver: Secondary | ICD-10-CM

## 2020-01-03 NOTE — Progress Notes (Signed)
Established Patient Office Visit  Subjective:  Patient ID: Michael Mack, male    DOB: [birth year]  Age: 59 y.o. MRN: [id number]  CC: No chief complaint on file.   HPI [name] presents for FU. Pt reports doing well, no changes in medications.  Past Medical History:  Diagnosis Date  . Bulging lumbar disc   . Diabetes mellitus without complication ([place])   . GERD (gastroesophageal reflux disease)   . Hepatitis C   . Hypertension   . Migraines     Past Surgical History:  Procedure Laterality Date  . KNEE SURGERY     Right Knee x 3, Left Knee x 1    Family History  Problem Relation Age of Onset  . Cancer Mother     Social History   Socioeconomic History  . Marital status: Married    Spouse name: Michael Mack  . Number of children: 0  . Years of education: Not on file  . Highest education level: 8th grade  Occupational History  . Occupation: unemployed  Tobacco Use  . Smoking status: Never Smoker  . Smokeless tobacco: Never Used  Substance and Sexual Activity  . Alcohol use: No  . Drug use: No  . Sexual activity: Yes  Other Topics Concern  . Not on file  Social History Narrative   Dental problems- Already went to [facility] dental care program that we referred him too. Visited [facility] program but was too expensive.   Social Determinants of Health   Financial Resource Strain:   . Difficulty of Paying Living Expenses:   Food Insecurity:   . Worried About Charity fundraiser in the Last Year:   . Arboriculturist in the Last Year:   Transportation Needs:   . Film/video editor (Medical):   Marland Kitchen Lack of Transportation (Non-Medical):   Physical Activity:   . Days of Exercise per Week:   . Minutes of Exercise per Session:   Stress:   . Feeling of Stress :   Social Connections:   . Frequency of Communication with Friends and Family:   . Frequency of Social Gatherings with Friends and Family:   . Attends Religious Services:   . Active Member of Clubs or  Organizations:   . Attends Archivist Meetings:   Marland Kitchen Marital Status:   Intimate Partner Violence:   . Fear of Current or Ex-Partner:   . Emotionally Abused:   Marland Kitchen Physically Abused:   . Sexually Abused:     Outpatient Medications Prior to Visit  Medication Sig Dispense Refill  . acetaminophen (TYLENOL) 500 MG tablet Take 500 mg by mouth as needed.    Marland Kitchen albuterol (VENTOLIN HFA) 108 (90 Base) MCG/ACT inhaler SMARTSIG:2 Puff(s) By Mouth Every 4-6 Hours PRN    . amitriptyline (ELAVIL) 50 MG tablet 100 mg. TAKE 2 TABLETS BY MOUTH NIGHTLY    . busPIRone (BUSPAR) 10 MG tablet Take 10 mg by mouth 3 (three) times daily as needed.    . Cholecalciferol 25 MCG (1000 UT) tablet Take by mouth.    . hydrochlorothiazide (HYDRODIURIL) 25 MG tablet TAKE ONE TABLET BY MOUTH EVERY DAY (Patient taking differently: Take 25 mg by mouth daily. ) 90 tablet 0  . Insulin Glargine (LANTUS SOLOSTAR) 100 UNIT/ML Solostar Pen INJECT 50 UNITS UNDER THE SKIN AT BEDTIME 30 mL 2  . insulin glulisine (APIDRA) 100 UNIT/ML injection Inject 0.35 mLs (35 Units total) into the skin 3 (three) times daily with meals.  10 mL 27  . lactulose (CHRONULAC) 10 GM/15ML solution TAKE 30MLS BY MOUTH 3 TIMES A DAY 946 mL 0  . lisinopril (ZESTRIL) 40 MG tablet TAKE ONE TABLET BY MOUTH EVERY DAY 60 tablet 0  . loratadine (CLARITIN) 10 MG tablet TAKE ONE TABLET BY MOUTH EVERY DAY 30 tablet 0  . methadone (DOLOPHINE) 10 MG tablet TAKE 1 OR 2 TABLETS BY MOUTH 3 TO 4 TIMES A DAY    . naloxone (NARCAN) nasal spray 4 mg/0.1 mL Place into the nose.    Marland Kitchen oxycodone (ROXICODONE) 30 MG immediate release tablet This is prescribed by his pain management team. TAKE ONE TABLET BY MOUTH THREE TIMES DAILY AS NEEDED FOR PAIN FOR 30 DAYS    . rifaximin (XIFAXAN) 550 MG TABS tablet Take 550 mg by mouth 2 (two) times daily.      No facility-administered medications prior to visit.    No Active Allergies  ROS Review of Systems    Objective:     Physical Exam  There were no vitals taken for this visit. Wt Readings from Last 3 Encounters:  12/05/19 274 lb 0.5 oz (124.3 kg)  08/30/19 267 lb 3.2 oz (121.2 kg)  02/22/19 278 lb 4.8 oz (126.2 kg)     Health Maintenance Due  Topic Date Due  . FOOT EXAM  Never done  . COLONOSCOPY  Never done  . OPHTHALMOLOGY EXAM  07/15/2019    There are no preventive care reminders to display for this patient.  Lab Results  Component Value Date   TSH 2.580 07/28/2017   Lab Results  Component Value Date   WBC 4.6 12/07/2019   HGB 14.2 12/07/2019   HCT 39.1 12/07/2019   MCV 85.0 12/07/2019   PLT 87 (L) 12/07/2019   Lab Results  Component Value Date   NA 140 12/07/2019   K 3.7 12/07/2019   CO2 25 12/07/2019   GLUCOSE 167 (H) 12/07/2019   BUN 16 12/07/2019   CREATININE 0.77 12/07/2019   BILITOT 1.1 12/05/2019   ALKPHOS 46 12/05/2019   AST 111 (H) 12/05/2019   ALT 158 (H) 12/05/2019   PROT 5.9 (L) 12/05/2019   ALBUMIN 2.8 (L) 12/05/2019   CALCIUM 8.5 (L) 12/07/2019   ANIONGAP 11 12/07/2019   Lab Results  Component Value Date   CHOL 156 11/09/2018   Lab Results  Component Value Date   HDL 49 11/09/2018   Lab Results  Component Value Date   LDLCALC 81 11/09/2018   Lab Results  Component Value Date   TRIG 98 12/04/2019   Lab Results  Component Value Date   CHOLHDL 3.2 11/09/2018   Lab Results  Component Value Date   HGBA1C 7.1 (H) 12/21/2019      Assessment & Plan:   Problem List Items Addressed This Visit      Cardiovascular and Mediastinum   Hypertension - Primary    Hypertension: 1. Essential hypertension Recent reading at [facility] found his blood pressues at 136/73 which is therapeutic. No Medication changes.  2. Acute respiratory failure with hypoxia (HCC) Resolved. No reoccurnace, suggesive of medication.    3. Cirrhosis of liver without ascites, unspecified hepatic cirrhosis type [facility]) Recent follow up at [facility] liver clinic. Found his condition  stale, no mediction changes.  Recent MRI June 30th at [facility] appears to be stable, with continued evidence of partal hypertension; stable large upper abdominal varices; no new suspicious hepatic lesions.   Noted almost accluded left portal vein thrombosis.  4.  Type 2 diabetes mellitus without complication, with long-term current use of insulin (HCC) Recent A1C 7.1, appears to be doing ok.  5. Other chronic pain Under management by physican at [facility], recent evaluation, no changes in his program made.  change.   Labs and office FU scheduled for October.  No orders of the defined types were placed in this encounter.   Follow-up: No follow-ups on file.    Abbigael Detlefsen Charyl Bigger

## 2020-01-17 ENCOUNTER — Other Ambulatory Visit: Payer: Self-pay | Admitting: Internal Medicine

## 2020-01-17 DIAGNOSIS — I10 Essential (primary) hypertension: Secondary | ICD-10-CM

## 2020-01-19 ENCOUNTER — Telehealth: Payer: Self-pay | Admitting: Pharmacist

## 2020-01-19 NOTE — Telephone Encounter (Signed)
01/19/2020 3:02:51 PM - Xifaxan call to [facility] & refilled  -- Michael Mack - Friday, January 19, 2020 2:59 PM --Patient in office today asking about Xifaxan-I called [place] spoke with [facility], she stated they shipped to patient and was delivered May 8 a 90 day supply. I was able to place refill today and request to be shipped here as it was on application, she will correct and process for refill. Talked with patient he confirmed he did receive in May, and explained I refilled today, if the shipment come to him, he will need to bring [place] the paperwork.

## 2020-03-06 ENCOUNTER — Other Ambulatory Visit: Payer: Self-pay | Admitting: Gerontology

## 2020-03-12 ENCOUNTER — Telehealth: Payer: Self-pay | Admitting: Pharmacist

## 2020-03-12 NOTE — Telephone Encounter (Signed)
03/12/2020 12:19:40 PM - Lantus Solostar & Apidra Solostar ref to dr  -- Michael Mack - Tuesday, March 12, 2020 12:18 PM --Sending refill request to [facility] for Lantus Solostar Inject 50 units under the skin daily at bedtime # 3 boxes & Apidra Solostar Inject 35 units three times daily # 6 boxes -Max 105 units daily.

## 2020-03-19 ENCOUNTER — Ambulatory Visit: Payer: Self-pay

## 2020-03-19 ENCOUNTER — Ambulatory Visit: Payer: Self-pay | Admitting: Pharmacy Technician

## 2020-03-19 ENCOUNTER — Other Ambulatory Visit: Payer: Self-pay

## 2020-03-19 DIAGNOSIS — Z79899 Other long term (current) drug therapy: Secondary | ICD-10-CM

## 2020-03-22 NOTE — Progress Notes (Signed)
Met with patient completed financial assistance application for [place] due to recent hospital visit.  Patient agreed to be responsible for gathering financial information and forwarding to appropriate department in [facility].    [facility]

## 2020-03-28 ENCOUNTER — Telehealth: Payer: Self-pay | Admitting: Pharmacist

## 2020-03-28 NOTE — Telephone Encounter (Signed)
03/28/2020 1:42:40 PM - Apidra & Lantus Solostars faxed for refill  -- Michael Mack - Thursday, March 28, 2020 1:40 PM --Faxed [employer] refill request for the following: Lantus Solostar Inject 50 units daily at bedtime # 3 boxes Apidra Solostar Inject 35units three times daily # 6 boxes Max 105 units daily.

## 2020-04-03 ENCOUNTER — Other Ambulatory Visit: Payer: Self-pay

## 2020-04-03 DIAGNOSIS — I10 Essential (primary) hypertension: Secondary | ICD-10-CM

## 2020-04-03 NOTE — Progress Notes (Signed)
psa

## 2020-04-04 LAB — COMPREHENSIVE METABOLIC PANEL
ALT: 39 IU/L (ref 0–44)
AST: 47 IU/L — ABNORMAL HIGH (ref 0–40)
Albumin/Globulin Ratio: 1.1 — ABNORMAL LOW (ref 1.2–2.2)
Albumin: 4.1 g/dL (ref 3.8–4.9)
Alkaline Phosphatase: 89 IU/L (ref 44–121)
BUN/Creatinine Ratio: 18 (ref 9–20)
BUN: 14 mg/dL (ref 6–24)
Bilirubin Total: 0.6 mg/dL (ref 0.0–1.2)
CO2: 24 mmol/L (ref 20–29)
Calcium: 9 mg/dL (ref 8.7–10.2)
Chloride: 101 mmol/L (ref 96–106)
Creatinine, Ser: 0.76 mg/dL (ref 0.76–1.27)
GFR calc Af Amer: 116 mL/min/{1.73_m2} (ref >59)
GFR calc non Af Amer: 101 mL/min/{1.73_m2} (ref >59)
Globulin, Total: 3.7 g/dL (ref 1.5–4.5)
Glucose: 225 mg/dL — ABNORMAL HIGH (ref 65–99)
Potassium: 3.9 mmol/L (ref 3.5–5.2)
Sodium: 141 mmol/L (ref 134–144)
Total Protein: 7.8 g/dL (ref 6.0–8.5)

## 2020-04-04 LAB — LIPID PANEL
Chol/HDL Ratio: 2.9 ratio (ref 0.0–5.0)
Cholesterol, Total: 176 mg/dL (ref 100–199)
HDL: 61 mg/dL (ref >39)
LDL Chol Calc (NIH): 97 mg/dL (ref 0–99)
Triglycerides: 100 mg/dL (ref 0–149)
VLDL Cholesterol Cal: 18 mg/dL (ref 5–40)

## 2020-04-04 LAB — CBC WITH DIFFERENTIAL/PLATELET
Basophils Absolute: 0 10*3/uL (ref 0.0–0.2)
Basos: 1 %
EOS (ABSOLUTE): 0.2 10*3/uL (ref 0.0–0.4)
Eos: 3 %
Hematocrit: 45.9 % (ref 37.5–51.0)
Hemoglobin: 15.7 g/dL (ref 13.0–17.7)
Immature Grans (Abs): 0 10*3/uL (ref 0.0–0.1)
Immature Granulocytes: 0 %
Lymphocytes Absolute: 1.5 10*3/uL (ref 0.7–3.1)
Lymphs: 23 %
MCH: 30.9 pg (ref 26.6–33.0)
MCHC: 34.2 g/dL (ref 31.5–35.7)
MCV: 90 fL (ref 79–97)
Monocytes Absolute: 0.4 10*3/uL (ref 0.1–0.9)
Monocytes: 6 %
Neutrophils Absolute: 4.3 10*3/uL (ref 1.4–7.0)
Neutrophils: 67 %
Platelets: 109 10*3/uL — ABNORMAL LOW (ref 150–450)
RBC: 5.08 x10E6/uL (ref 4.14–5.80)
RDW: 13.7 % (ref 11.6–15.4)
WBC: 6.4 10*3/uL (ref 3.4–10.8)

## 2020-04-04 LAB — PSA: Prostate Specific Ag, Serum: <0.1 ng/mL (ref 0.0–4.0)

## 2020-04-04 LAB — HEMOGLOBIN A1C
Est. average glucose Bld gHb Est-mCnc: 169 mg/dL
Hgb A1c MFr Bld: 7.5 % — ABNORMAL HIGH (ref 4.8–5.6)

## 2020-04-04 LAB — TSH: TSH: 2.22 u[IU]/mL (ref 0.450–4.500)

## 2020-04-10 ENCOUNTER — Ambulatory Visit: Payer: Self-pay | Admitting: Internal Medicine

## 2020-04-10 ENCOUNTER — Encounter: Payer: Self-pay | Admitting: Internal Medicine

## 2020-04-10 ENCOUNTER — Other Ambulatory Visit: Payer: Self-pay

## 2020-04-10 DIAGNOSIS — J9601 Acute respiratory failure with hypoxia: Secondary | ICD-10-CM

## 2020-04-10 DIAGNOSIS — K7460 Unspecified cirrhosis of liver: Secondary | ICD-10-CM

## 2020-04-10 DIAGNOSIS — E119 Type 2 diabetes mellitus without complications: Secondary | ICD-10-CM

## 2020-04-10 DIAGNOSIS — I10 Essential (primary) hypertension: Secondary | ICD-10-CM

## 2020-04-10 DIAGNOSIS — Z794 Long term (current) use of insulin: Secondary | ICD-10-CM

## 2020-04-10 NOTE — Progress Notes (Signed)
Established Patient Office Visit  Subjective:  Patient ID: Michael Mack, male    DOB: 08-26-1960  Age: 59 y.o. MRN: [id number]  CC:  Chief Complaint  Patient presents with  . Follow-up    HPI Michael Mack presents for follow up. Pt reports doing well.   Past Medical History:  Diagnosis Date  . Bulging lumbar disc   . Diabetes mellitus without complication ([place])   . GERD (gastroesophageal reflux disease)   . Hepatitis C   . Hypertension   . Migraines     Past Surgical History:  Procedure Laterality Date  . KNEE SURGERY     Right Knee x 3, Left Knee x 1    Family History  Problem Relation Age of Onset  . Cancer Mother     Social History   Socioeconomic History  . Marital status: Married    Spouse name: Michael Mack  . Number of children: 0  . Years of education: Not on file  . Highest education level: 8th grade  Occupational History  . Occupation: unemployed  Tobacco Use  . Smoking status: Never Smoker  . Smokeless tobacco: Never Used  Substance and Sexual Activity  . Alcohol use: No  . Drug use: No  . Sexual activity: Yes  Other Topics Concern  . Not on file  Social History Narrative   Dental problems- Already went to [facility] dental care program that we referred him too. Visited [facility] program but was too expensive.   Social Determinants of Health   Financial Resource Strain:   . Difficulty of Paying Living Expenses: Not on file  Food Insecurity:   . Worried About Charity fundraiser in the Last Year: Not on file  . Ran Out of Food in the Last Year: Not on file  Transportation Needs:   . Lack of Transportation (Medical): Not on file  . Lack of Transportation (Non-Medical): Not on file  Physical Activity:   . Days of Exercise per Week: Not on file  . Minutes of Exercise per Session: Not on file  Stress:   . Feeling of Stress : Not on file  Social Connections:   . Frequency of Communication with Friends and Family: Not on file  . Frequency of  Social Gatherings with Friends and Family: Not on file  . Attends Religious Services: Not on file  . Active Member of Clubs or Organizations: Not on file  . Attends Archivist Meetings: Not on file  . Marital Status: Not on file  Intimate Partner Violence:   . Fear of Current or Ex-Partner: Not on file  . Emotionally Abused: Not on file  . Physically Abused: Not on file  . Sexually Abused: Not on file    Outpatient Medications Prior to Visit  Medication Sig Dispense Refill  . acetaminophen (TYLENOL) 500 MG tablet Take 500 mg by mouth as needed.    Marland Kitchen albuterol (VENTOLIN HFA) 108 (90 Base) MCG/ACT inhaler SMARTSIG:2 Puff(s) By Mouth Every 4-6 Hours PRN    . amitriptyline (ELAVIL) 50 MG tablet 100 mg. TAKE 2 TABLETS BY MOUTH NIGHTLY    . APIDRA SOLOSTAR 100 UNIT/ML Solostar Pen INJECT 35 UNITS UNDER THE SKIN 3 TIMES A DAY WITH MEALS 60 mL 0  . busPIRone (BUSPAR) 10 MG tablet Take 10 mg by mouth 3 (three) times daily as needed.    . Cholecalciferol 25 MCG (1000 UT) tablet Take by mouth.    . hydrochlorothiazide (HYDRODIURIL) 25 MG tablet TAKE  ONE TABLET BY MOUTH EVERY DAY 90 tablet 0  . Insulin Glargine (LANTUS SOLOSTAR) 100 UNIT/ML Solostar Pen INJECT 50 UNITS UNDER THE SKIN AT BEDTIME 30 mL 2  . insulin glulisine (APIDRA) 100 UNIT/ML injection Inject 0.35 mLs (35 Units total) into the skin 3 (three) times daily with meals. 10 mL 27  . lactulose (CHRONULAC) 10 GM/15ML solution TAKE 30MLS BY MOUTH 3 TIMES A DAY 946 mL 0  . lisinopril (ZESTRIL) 40 MG tablet Take 1 tablet (40 mg total) by mouth daily. 90 tablet 0  . loratadine (CLARITIN) 10 MG tablet TAKE ONE TABLET BY MOUTH EVERY DAY 30 tablet 0  . methadone (DOLOPHINE) 10 MG tablet TAKE 1 OR 2 TABLETS BY MOUTH 3 TO 4 TIMES A DAY    . naloxone (NARCAN) nasal spray 4 mg/0.1 mL Place into the nose.    Marland Kitchen oxycodone (ROXICODONE) 30 MG immediate release tablet This is prescribed by his pain management team. TAKE ONE TABLET BY MOUTH THREE  TIMES DAILY AS NEEDED FOR PAIN FOR 30 DAYS    . rifaximin (XIFAXAN) 550 MG TABS tablet Take 550 mg by mouth 2 (two) times daily.      No facility-administered medications prior to visit.    No Active Allergies  ROS Review of Systems    Objective:    Physical Exam  There were no vitals taken for this visit. Wt Readings from Last 3 Encounters:  12/05/19 274 lb 0.5 oz (124.3 kg)  08/30/19 267 lb 3.2 oz (121.2 kg)  02/22/19 278 lb 4.8 oz (126.2 kg)     Health Maintenance Due  Topic Date Due  . FOOT EXAM  Never done  . COLONOSCOPY  Never done  . OPHTHALMOLOGY EXAM  07/15/2019  . INFLUENZA VACCINE  01/21/2020    There are no preventive care reminders to display for this patient.  Lab Results  Component Value Date   TSH 2.220 04/03/2020   Lab Results  Component Value Date   WBC 6.4 04/03/2020   HGB 15.7 04/03/2020   HCT 45.9 04/03/2020   MCV 90 04/03/2020   PLT 109 (L) 04/03/2020   Lab Results  Component Value Date   NA 141 04/03/2020   K 3.9 04/03/2020   CO2 24 04/03/2020   GLUCOSE 225 (H) 04/03/2020   BUN 14 04/03/2020   CREATININE 0.76 04/03/2020   BILITOT 0.6 04/03/2020   ALKPHOS 89 04/03/2020   AST 47 (H) 04/03/2020   ALT 39 04/03/2020   PROT 7.8 04/03/2020   ALBUMIN 4.1 04/03/2020   CALCIUM 9.0 04/03/2020   ANIONGAP 11 12/07/2019   Lab Results  Component Value Date   CHOL 176 04/03/2020   Lab Results  Component Value Date   HDL 61 04/03/2020   Lab Results  Component Value Date   LDLCALC 97 04/03/2020   Lab Results  Component Value Date   TRIG 100 04/03/2020   Lab Results  Component Value Date   CHOLHDL 2.9 04/03/2020   Lab Results  Component Value Date   HGBA1C 7.5 (H) 04/03/2020      Assessment & Plan:   Problem List Items Addressed This Visit      Cardiovascular and Mediastinum   Hypertension - Primary     Respiratory   Acute respiratory failure with hypoxia (HCC)     Digestive   Cirrhosis ([place])     Endocrine    Diabetes ([place])      No orders of the defined types were placed in this  encounter. 1. Primary hypertension Appears to be close to target, last reading done at recent [facility] visit. No change in medications.  2. Acute respiratory failure with hypoxia (HCC) Appears to be doing well, no reoccurrence. No further history of over abuse of pain medications.  3. Cirrhosis of liver without ascites, unspecified hepatic cirrhosis type [facility]) Being followed actively at [facility] liver clinic. Recent upper GI appears to be stable. MRI Pending to check liver nodule.   4. Type 2 diabetes mellitus without complication, with long-term current use of insulin ([place]) Followed by endocrinologist at [facility], recent a1c 7.5.   5. Chronic Pain Followed by [facility] pain clinic, recent evaluation was stable.    Follow-up: No follow-ups on file.  Pt has 3 month fu and 3 month labs scheduled for January 2022.    Kc Summerson Charyl Bigger

## 2020-06-26 ENCOUNTER — Other Ambulatory Visit: Payer: Self-pay

## 2020-06-27 ENCOUNTER — Other Ambulatory Visit: Payer: Self-pay

## 2020-06-27 DIAGNOSIS — J9601 Acute respiratory failure with hypoxia: Secondary | ICD-10-CM

## 2020-06-28 LAB — SPECIMEN STATUS REPORT

## 2020-07-01 ENCOUNTER — Telehealth: Payer: Self-pay | Admitting: Pharmacist

## 2020-07-01 NOTE — Telephone Encounter (Signed)
07/01/2020 10:07:32 AM - Renewal [employer] to pt for Lantus/Toujeo  -- Michael Mack - Monday, July 01, 2020 10:06 AM --Mailing renewal [employer] application to patient to sign & return for [employer] & Apidra Solostar--per Tanez @ [employer] due Feb.

## 2020-07-03 ENCOUNTER — Other Ambulatory Visit: Payer: Self-pay

## 2020-07-03 ENCOUNTER — Telehealth: Payer: Self-pay | Admitting: Internal Medicine

## 2020-07-03 ENCOUNTER — Encounter: Payer: Self-pay | Admitting: Internal Medicine

## 2020-07-03 DIAGNOSIS — E119 Type 2 diabetes mellitus without complications: Secondary | ICD-10-CM

## 2020-07-03 DIAGNOSIS — K7460 Unspecified cirrhosis of liver: Secondary | ICD-10-CM

## 2020-07-03 DIAGNOSIS — C220 Liver cell carcinoma: Secondary | ICD-10-CM

## 2020-07-03 DIAGNOSIS — I10 Essential (primary) hypertension: Secondary | ICD-10-CM

## 2020-07-03 DIAGNOSIS — Z794 Long term (current) use of insulin: Secondary | ICD-10-CM

## 2020-07-03 NOTE — Progress Notes (Signed)
Established Patient Office Visit  Subjective:  Patient ID: Michael Mack, male    DOB: December 28, 1960  Age: 60 y.o. MRN: [id number]  CC:  Chief Complaint  Patient presents with  . Follow-up    Recent surgery of liver    HPI [name] presents for follow up.   Past Medical History:  Diagnosis Date  . Bulging lumbar disc   . Diabetes mellitus without complication ([place])   . GERD (gastroesophageal reflux disease)   . Hepatitis C   . Hypertension   . Migraines     Past Surgical History:  Procedure Laterality Date  . KNEE SURGERY     Right Knee x 3, Left Knee x 1    Family History  Problem Relation Age of Onset  . Cancer Mother     Social History   Socioeconomic History  . Marital status: Married    Spouse name: Khalen Styer  . Number of children: 0  . Years of education: Not on file  . Highest education level: 8th grade  Occupational History  . Occupation: unemployed  Tobacco Use  . Smoking status: Never Smoker  . Smokeless tobacco: Never Used  Substance and Sexual Activity  . Alcohol use: No  . Drug use: No  . Sexual activity: Yes  Other Topics Concern  . Not on file  Social History Narrative   Dental problems- Already went to [facility] dental care program that we referred him too. Visited [facility] program but was too expensive.   Social Determinants of Health   Financial Resource Strain: Not on file  Food Insecurity: Not on file  Transportation Needs: Not on file  Physical Activity: Not on file  Stress: Not on file  Social Connections: Not on file  Intimate Partner Violence: Not on file    Outpatient Medications Prior to Visit  Medication Sig Dispense Refill  . acetaminophen (TYLENOL) 500 MG tablet Take 500 mg by mouth as needed.    Marland Kitchen albuterol (VENTOLIN HFA) 108 (90 Base) MCG/ACT inhaler SMARTSIG:2 Puff(s) By Mouth Every 4-6 Hours PRN    . amitriptyline (ELAVIL) 50 MG tablet 100 mg. TAKE 2 TABLETS BY MOUTH NIGHTLY    . APIDRA SOLOSTAR 100 UNIT/ML  Solostar Pen INJECT 35 UNITS UNDER THE SKIN 3 TIMES A DAY WITH MEALS 60 mL 0  . busPIRone (BUSPAR) 10 MG tablet Take 10 mg by mouth 3 (three) times daily as needed.    . Cholecalciferol 25 MCG (1000 UT) tablet Take by mouth.    . hydrochlorothiazide (HYDRODIURIL) 25 MG tablet TAKE ONE TABLET BY MOUTH EVERY DAY 90 tablet 0  . Insulin Glargine (LANTUS SOLOSTAR) 100 UNIT/ML Solostar Pen INJECT 50 UNITS UNDER THE SKIN AT BEDTIME 30 mL 2  . insulin glulisine (APIDRA) 100 UNIT/ML injection Inject 0.35 mLs (35 Units total) into the skin 3 (three) times daily with meals. 10 mL 27  . lactulose (CHRONULAC) 10 GM/15ML solution TAKE 30MLS BY MOUTH 3 TIMES A DAY 946 mL 0  . lisinopril (ZESTRIL) 40 MG tablet Take 1 tablet (40 mg total) by mouth daily. 90 tablet 0  . loratadine (CLARITIN) 10 MG tablet TAKE ONE TABLET BY MOUTH EVERY DAY 30 tablet 0  . methadone (DOLOPHINE) 10 MG tablet TAKE 1 OR 2 TABLETS BY MOUTH 3 TO 4 TIMES A DAY    . naloxone (NARCAN) nasal spray 4 mg/0.1 mL Place into the nose.    Marland Kitchen oxycodone (ROXICODONE) 30 MG immediate release tablet This is prescribed by  his pain management team. TAKE ONE TABLET BY MOUTH THREE TIMES DAILY AS NEEDED FOR PAIN FOR 30 DAYS    . rifaximin (XIFAXAN) 550 MG TABS tablet Take 550 mg by mouth 2 (two) times daily.      No facility-administered medications prior to visit.    No Active Allergies  ROS Review of Systems    Objective:    Physical Exam  There were no vitals taken for this visit. Wt Readings from Last 3 Encounters:  12/05/19 274 lb 0.5 oz (124.3 kg)  08/30/19 267 lb 3.2 oz (121.2 kg)  02/22/19 278 lb 4.8 oz (126.2 kg)     Health Maintenance Due  Topic Date Due  . FOOT EXAM  Never done  . COLONOSCOPY (Pts 45-84yrs Insurance coverage will need to be confirmed)  Never done  . URINE MICROALBUMIN  05/13/2019  . OPHTHALMOLOGY EXAM  07/15/2019  . COVID-19 Vaccine (3 - Moderna risk 4-dose series) 11/06/2019  . INFLUENZA VACCINE  01/21/2020     There are no preventive care reminders to display for this patient.  Lab Results  Component Value Date   TSH 2.220 04/03/2020   Lab Results  Component Value Date   WBC 4.7 06/27/2020   HGB 13.6 06/27/2020   HCT 39.6 06/27/2020   MCV 90 06/27/2020   PLT 82 (LL) 06/27/2020   Lab Results  Component Value Date   NA 142 06/27/2020   K 3.9 06/27/2020   CO2 19 (L) 06/27/2020   GLUCOSE 116 (H) 06/27/2020   BUN 16 06/27/2020   CREATININE 0.76 06/27/2020   BILITOT 0.5 06/27/2020   ALKPHOS 71 06/27/2020   AST 23 06/27/2020   ALT 28 06/27/2020   PROT 7.0 06/27/2020   ALBUMIN 4.0 06/27/2020   CALCIUM 8.7 06/27/2020   ANIONGAP 11 12/07/2019   Lab Results  Component Value Date   CHOL 176 04/03/2020   Lab Results  Component Value Date   HDL 61 04/03/2020   Lab Results  Component Value Date   LDLCALC 97 04/03/2020   Lab Results  Component Value Date   TRIG 100 04/03/2020   Lab Results  Component Value Date   CHOLHDL 2.9 04/03/2020   Lab Results  Component Value Date   HGBA1C 8.4 (H) 06/27/2020      Assessment & Plan:   Problem List Items Addressed This Visit      Cardiovascular and Mediastinum   Hypertension - Primary     Digestive   Cirrhosis ([place])   Hepatocellular carcinoma ([place])     Endocrine   Diabetes ([place])      No orders of the defined types were placed in this encounter. 1. Hepatocellular carcinoma ([place]) Recent diagnosis at [facility]. Undergoing ablation therapy by [facility].    2. Primary hypertension Appears to be close to target, no medication changes.  3. Cirrhosis of liver without ascites, unspecified hepatic cirrhosis type [facility]) Following by [facility], recent diagnosed with liver cancer.   4. Type 2 diabetes mellitus without complication, with long-term current use of insulin ([place]) Followed by [facility], no medication changes.   5.Acute Respiratory failure with hypoxia No evidence of reoccurrence   6. Chronic  Pain Followed by [facility].  Plan to follow up in 6 months with labs.     Follow-up: Return in about 26 weeks (around 01/01/2021).    Raina Sole Charyl Bigger

## 2020-07-05 LAB — GAMMA GT: GGT: 67 IU/L — ABNORMAL HIGH (ref 0–65)

## 2020-07-05 LAB — CBC WITH DIFFERENTIAL/PLATELET
Basophils Absolute: 0 10*3/uL (ref 0.0–0.2)
Basos: 0 %
EOS (ABSOLUTE): 0.2 10*3/uL (ref 0.0–0.4)
Eos: 4 %
Hematocrit: 39.6 % (ref 37.5–51.0)
Hemoglobin: 13.6 g/dL (ref 13.0–17.7)
Immature Grans (Abs): 0 10*3/uL (ref 0.0–0.1)
Immature Granulocytes: 0 %
Lymphocytes Absolute: 1.2 10*3/uL (ref 0.7–3.1)
Lymphs: 26 %
MCH: 30.8 pg (ref 26.6–33.0)
MCHC: 34.3 g/dL (ref 31.5–35.7)
MCV: 90 fL (ref 79–97)
Monocytes Absolute: 0.4 10*3/uL (ref 0.1–0.9)
Monocytes: 8 %
Neutrophils Absolute: 2.8 10*3/uL (ref 1.4–7.0)
Neutrophils: 62 %
Platelets: 82 10*3/uL — CL (ref 150–450)
RBC: 4.41 x10E6/uL (ref 4.14–5.80)
RDW: 13.5 % (ref 11.6–15.4)
WBC: 4.7 10*3/uL (ref 3.4–10.8)

## 2020-07-05 LAB — COMPREHENSIVE METABOLIC PANEL
ALT: 28 IU/L (ref 0–44)
AST: 23 IU/L (ref 0–40)
Albumin/Globulin Ratio: 1.3 (ref 1.2–2.2)
Albumin: 4 g/dL (ref 3.8–4.9)
Alkaline Phosphatase: 71 IU/L (ref 44–121)
BUN/Creatinine Ratio: 21 — ABNORMAL HIGH (ref 9–20)
BUN: 16 mg/dL (ref 6–24)
Bilirubin Total: 0.5 mg/dL (ref 0.0–1.2)
CO2: 19 mmol/L — ABNORMAL LOW (ref 20–29)
Calcium: 8.7 mg/dL (ref 8.7–10.2)
Chloride: 108 mmol/L — ABNORMAL HIGH (ref 96–106)
Creatinine, Ser: 0.76 mg/dL (ref 0.76–1.27)
GFR calc Af Amer: 115 mL/min/{1.73_m2} (ref >59)
GFR calc non Af Amer: 100 mL/min/{1.73_m2} (ref >59)
Globulin, Total: 3 g/dL (ref 1.5–4.5)
Glucose: 116 mg/dL — ABNORMAL HIGH (ref 65–99)
Potassium: 3.9 mmol/L (ref 3.5–5.2)
Sodium: 142 mmol/L (ref 134–144)
Total Protein: 7 g/dL (ref 6.0–8.5)

## 2020-07-05 LAB — HEMOGLOBIN A1C
Est. average glucose Bld gHb Est-mCnc: 194 mg/dL
Hgb A1c MFr Bld: 8.4 % — ABNORMAL HIGH (ref 4.8–5.6)

## 2020-07-05 LAB — URINALYSIS
Bilirubin, UA: NEGATIVE
Ketones, UA: NEGATIVE
Nitrite, UA: NEGATIVE
Protein,UA: NEGATIVE
RBC, UA: NEGATIVE
Specific Gravity, UA: 1.017 (ref 1.005–1.030)
Urobilinogen, Ur: 0.2 mg/dL (ref 0.2–1.0)
pH, UA: 5.5 (ref 5.0–7.5)

## 2020-07-05 LAB — AMMONIA: Ammonia: 123 ug/dL (ref 40–200)

## 2020-07-05 LAB — PROTIME-INR

## 2020-07-17 ENCOUNTER — Telehealth: Payer: Self-pay | Admitting: Pharmacist

## 2020-07-17 NOTE — Telephone Encounter (Signed)
07/17/2020 10:34:03 AM - refills Lantus & Apidra solostars to [facility]  -- Michael Mack - Wednesday, July 17, 2020 10:32 AM --Virgel Gess [employer] refill request for Lantus Solostar Inject 50 units under the skin once daily # 3  & Apidra Solostar Inject 35 units under the skin three times daily # 6.

## 2020-07-19 ENCOUNTER — Encounter: Payer: Self-pay | Admitting: Pharmacist

## 2020-07-19 ENCOUNTER — Ambulatory Visit: Payer: Self-pay | Admitting: Pharmacist

## 2020-07-19 ENCOUNTER — Other Ambulatory Visit: Payer: Self-pay

## 2020-07-19 DIAGNOSIS — Z79899 Other long term (current) drug therapy: Secondary | ICD-10-CM

## 2020-07-19 NOTE — Progress Notes (Signed)
Medication Management Clinic Visit Note  Patient: Michael Mack MRN: [id number] Date of Birth: [birth year] PCP: Tawni Millers, MD   [name] 60 y.o. male presents for a telephone visit for medication management today. Verified patient with two identifiers.   There were no vitals taken for this visit.  Patient Information   Past Medical History:  Diagnosis Date  . Bulging lumbar disc   . Diabetes mellitus without complication ([place])   . GERD (gastroesophageal reflux disease)   . Hepatitis C   . Hypertension   . Migraines       Past Surgical History:  Procedure Laterality Date  . KNEE SURGERY     Right Knee x 3, Left Knee x 1     Family History  Problem Relation Age of Onset  . Cancer Mother     New Diagnoses (since last visit): hepatic encephalopathy   Family Support: Good  Lifestyle Diet: Breakfast: cereal, eggs Lunch: soup or sandwich  Dinner: hamburger or porkchop and vegetables  Drinks: whole milk     Current Exercise Habits: Home exercise routine, Type of exercise: treadmill, Time (Minutes): 30, Frequency (Times/Week): 2, Weekly Exercise (Minutes/Week): 60  Exercise limited by: Other - see comments    Social History   Substance and Sexual Activity  Alcohol Use No      Social History   Tobacco Use  Smoking Status Never Smoker  Smokeless Tobacco Never Used      Health Maintenance  Topic Date Due  . FOOT EXAM  Never done  . COLONOSCOPY (Pts 45-45yrs Insurance coverage will need to be confirmed)  Never done  . URINE MICROALBUMIN  05/13/2019  . OPHTHALMOLOGY EXAM  07/15/2019  . INFLUENZA VACCINE  01/21/2020  . HEMOGLOBIN A1C  12/25/2020  . COVID-19 Vaccine (4 - Booster) 12/26/2020  . TETANUS/TDAP  03/15/2028  . PNEUMOCOCCAL POLYSACCHARIDE VACCINE AGE 50-64 HIGH RISK  Completed  . Hepatitis C Screening  Completed  . HIV Screening  Completed   Health Maintenance/Date Completed  Last ED visit: none recent Last Visit to PCP:  07/06/2020 Next Visit to PCP: 11/13/2020 Specialist Visit: 07/25/2020 - for liver Dental Exam: "been a while, I have dentures now" Eye Exam: informed him [facility] is looking for new optometrist  Prostate Exam: 2021 Colonoscopy: 2021 Flu Vaccine: completed Pneumonia Vaccine: completed  COVID-19 Vaccine: completed primary series and booster Shingrix Vaccine: pt is going to ask provider about this one  Outpatient Encounter Medications as of 07/19/2020  Medication Sig  . acetaminophen (TYLENOL) 500 MG tablet Take 500 mg by mouth as needed for headache.  Marland Kitchen amitriptyline (ELAVIL) 50 MG tablet 100 mg at bedtime.  . APIDRA SOLOSTAR 100 UNIT/ML Solostar Pen INJECT 35 UNITS UNDER THE SKIN 3 TIMES A DAY WITH MEALS  . Cholecalciferol 25 MCG (1000 UT) tablet Take 1,000 Units by mouth daily.  . hydrochlorothiazide (HYDRODIURIL) 25 MG tablet TAKE ONE TABLET BY MOUTH EVERY DAY  . Insulin Glargine (LANTUS SOLOSTAR) 100 UNIT/ML Solostar Pen INJECT 50 UNITS UNDER THE SKIN AT BEDTIME  . lactulose (CHRONULAC) 10 GM/15ML solution TAKE 30MLS BY MOUTH 3 TIMES A DAY  . lisinopril (ZESTRIL) 40 MG tablet Take 1 tablet (40 mg total) by mouth daily.  . methadone (DOLOPHINE) 10 MG tablet 30 mg every 8 (eight) hours as needed for moderate pain or severe pain.  . multivitamin-iron-minerals-folic acid (CENTRUM) chewable tablet Chew 1 tablet by mouth daily.  Marland Kitchen oxycodone (ROXICODONE) 30 MG immediate release tablet 40 mg 2 (two)  times daily as needed for pain. This is prescribed by his pain management team.  . rifaximin (XIFAXAN) 550 MG TABS tablet Take 550 mg by mouth 2 (two) times daily.  . naloxone (NARCAN) nasal spray 4 mg/0.1 mL Place into the nose. (Patient not taking: Reported on 07/19/2020)  . [DISCONTINUED] albuterol (VENTOLIN HFA) 108 (90 Base) MCG/ACT inhaler SMARTSIG:2 Puff(s) By Mouth Every 4-6 Hours PRN (Patient not taking: Reported on 07/19/2020)  . [DISCONTINUED] busPIRone (BUSPAR) 10 MG tablet Take 10 mg by mouth 3  (three) times daily as needed.  . [DISCONTINUED] insulin glulisine (APIDRA) 100 UNIT/ML injection Inject 0.35 mLs (35 Units total) into the skin 3 (three) times daily with meals.  . [DISCONTINUED] loratadine (CLARITIN) 10 MG tablet TAKE ONE TABLET BY MOUTH EVERY DAY  . [DISCONTINUED] rifaximin (XIFAXAN) 550 MG TABS tablet Take 550 mg by mouth 2 (two) times daily.    No facility-administered encounter medications on file as of 07/19/2020.     Assessment and Plan: Diabetes Pt currently takes lantus and apidra. His most recent A1c was 8% on 07/16/20. He checks his BG 4-6 times per day and is usually 100-130s or 200s s/p meal. Pt denies any symptoms or episodes of hypoglycemia. Pt reports minimal exercise of 2-3 times per week for about 30 minutes each time. Encouraged pt to increase his exercise to have 150 minutes for the entire week spread across a few days as tolerated. Also encouraged pt to drink more water and to try and cut back on whole milk and to incorporate more vegetables into his diet. Pt has an appointment in 10/2020 and will be reassessed at this time. Hopeful that patient will make these lifestyles changes to help improve his A1c.   HTN Pt takes lisinopril and HCTZ. He does have a BP cuff at home but states he does not really use it. He states that his last visit on 07/06/20 his BP was 116/50 - controlled. Pt denies any symptoms or episodes of hypotension. See Diabetes for exercise and diet. Continue current regimen.    Hepatic encephalopathy Pt takes rifaxamin and lactulose and does not have any complaints at this time. Continue current regimen.   Depression/Anxiety Pt takes amitriptyline and denies any changes in mood or behavior. Continue current regimen.   Chronic pain Pt is managed at the pain clinic and is taking methadone and oxycodone. He states these medications are helping with his pain and did not have any other complaints at this time. Continue current regimen and follow up  with pain clinic if pain worsens or becomes more frequent.    Access/Adherence Pt endorses adherence and did not need any refills at this time.    Sherilyn Banker, PharmD Pharmacy Resident  07/19/2020 1:55 PM

## 2020-07-30 ENCOUNTER — Other Ambulatory Visit: Payer: Self-pay | Admitting: Gerontology

## 2020-07-30 ENCOUNTER — Other Ambulatory Visit: Payer: Self-pay | Admitting: Internal Medicine

## 2020-07-30 DIAGNOSIS — E119 Type 2 diabetes mellitus without complications: Secondary | ICD-10-CM

## 2020-07-30 DIAGNOSIS — Z794 Long term (current) use of insulin: Secondary | ICD-10-CM

## 2020-07-31 ENCOUNTER — Other Ambulatory Visit: Payer: Self-pay | Admitting: Internal Medicine

## 2020-08-02 ENCOUNTER — Other Ambulatory Visit: Payer: Self-pay | Admitting: Internal Medicine

## 2020-08-02 DIAGNOSIS — I10 Essential (primary) hypertension: Secondary | ICD-10-CM

## 2020-08-06 ENCOUNTER — Other Ambulatory Visit: Payer: Self-pay | Admitting: Internal Medicine

## 2020-08-07 ENCOUNTER — Other Ambulatory Visit: Payer: Self-pay | Admitting: Family

## 2020-08-14 ENCOUNTER — Telehealth: Payer: Self-pay | Admitting: Pharmacist

## 2020-08-14 NOTE — Telephone Encounter (Signed)
08/14/2020 8:13:29 AM - Lantus Solostar&Apidra Solostar renewal to [place] - Wednesday, August 14, 2020 8:10 AM -- Virgel Gess [employer] renewal for Lantus Solostar Inject 50 units daily # 3 boxes-Dx. code:E11.9 & Apidra Solostar Inject 35 units 3 times a day #6 boxes-Dx. code: E11.9. Per previous conversation with a rep-Tanez @ [employer] patient could be renewed after 08/11/2020.

## 2020-09-06 LAB — HM DIABETES EYE EXAM

## 2020-09-16 ENCOUNTER — Telehealth: Payer: Self-pay | Admitting: Pharmacist

## 2020-09-16 NOTE — Telephone Encounter (Signed)
09/16/2020 10:04:17 AM - Xifaxan renewal to pat. & provider -- Elmer Picker - Monday, September 16, 2020 10:03 AM -- Mailing renewal application for Xifaxan to patient to sign & return, also to provider.

## 2020-09-25 ENCOUNTER — Other Ambulatory Visit: Payer: Self-pay

## 2020-10-01 ENCOUNTER — Other Ambulatory Visit: Payer: Self-pay

## 2020-10-01 MED FILL — Insulin Glulisine Soln Pen-Injector Inj 100 Unit/ML: SUBCUTANEOUS | 120 days supply | Qty: 90 | Fill #0 | Status: AC

## 2020-10-01 MED FILL — Insulin Glulisine Soln Pen-Injector Inj 100 Unit/ML: SUBCUTANEOUS | 120 days supply | Qty: 90 | Fill #0 | Status: CN

## 2020-10-02 ENCOUNTER — Other Ambulatory Visit: Payer: Self-pay

## 2020-10-02 MED FILL — Lactulose (Encephalopathy) Solution 10 GM/15ML: ORAL | 21 days supply | Qty: 1892 | Fill #0 | Status: AC

## 2020-10-03 ENCOUNTER — Other Ambulatory Visit: Payer: Self-pay

## 2020-10-03 MED ORDER — INSULIN PEN NEEDLE 32G X 4 MM MISC
11 refills | Status: DC
Start: 2020-10-03 — End: 2022-05-13
  Filled 2020-10-03: qty 100, 30d supply, fill #0
  Filled 2021-04-04: qty 100, 30d supply, fill #1
  Filled 2021-04-29: qty 100, 25d supply, fill #2
  Filled 2021-06-24: qty 100, 25d supply, fill #3
  Filled 2021-09-24: qty 100, 25d supply, fill #4

## 2020-10-16 ENCOUNTER — Other Ambulatory Visit: Payer: Self-pay

## 2020-10-16 ENCOUNTER — Telehealth: Payer: Self-pay | Admitting: Pharmacist

## 2020-10-16 NOTE — Telephone Encounter (Signed)
PATIENT Michael Mack.Michael Mack

## 2020-10-21 ENCOUNTER — Other Ambulatory Visit: Payer: Self-pay

## 2020-10-21 MED FILL — Insulin Glargine Soln Pen-Injector 100 Unit/ML: SUBCUTANEOUS | 90 days supply | Qty: 45 | Fill #0 | Status: AC

## 2020-10-28 ENCOUNTER — Other Ambulatory Visit: Payer: Self-pay

## 2020-10-28 ENCOUNTER — Other Ambulatory Visit: Payer: Self-pay | Admitting: Internal Medicine

## 2020-10-28 MED ORDER — HYDROCHLOROTHIAZIDE 25 MG PO TABS
ORAL_TABLET | Freq: Every day | ORAL | 0 refills | Status: DC
  Filled 2020-10-28 – 2020-10-29 (×2): qty 90, 90d supply, fill #0

## 2020-10-28 MED FILL — Lactulose (Encephalopathy) Solution 10 GM/15ML: ORAL | 21 days supply | Qty: 1892 | Fill #1 | Status: AC

## 2020-10-29 ENCOUNTER — Other Ambulatory Visit: Payer: Self-pay

## 2020-10-30 ENCOUNTER — Other Ambulatory Visit: Payer: Self-pay

## 2020-10-30 MED FILL — Lisinopril Tab 40 MG: ORAL | 90 days supply | Qty: 90 | Fill #0 | Status: AC

## 2020-11-07 ENCOUNTER — Other Ambulatory Visit: Payer: Self-pay

## 2020-11-11 ENCOUNTER — Telehealth: Payer: Self-pay | Admitting: Pharmacist

## 2020-11-11 NOTE — Telephone Encounter (Signed)
11/11/2020 3:25:03 PM - Apidra Solostar refill to provider  -- Elmer Picker - Monday, Nov 11, 2020 3:24 PM --Printed [employer] refill requests for [name] 35 units under the skin three times daily # 6 boxes for [place] to sign in [facility] folder.

## 2020-11-21 ENCOUNTER — Telehealth: Payer: Self-pay | Admitting: Pharmacist

## 2020-11-21 NOTE — Telephone Encounter (Signed)
11/21/2020 1:23:19 PM - Apidra Solostar refill faxed to [place] - Thursday, November 21, 2020 1:22 PM --Faxed [employer] refill for [name] 35 units under the skin three times daily # 6 boxes.

## 2020-11-27 ENCOUNTER — Other Ambulatory Visit: Payer: Self-pay

## 2020-11-28 ENCOUNTER — Other Ambulatory Visit: Payer: Self-pay

## 2020-11-28 MED FILL — Lactulose (Encephalopathy) Solution 10 GM/15ML: ORAL | 21 days supply | Qty: 1892 | Fill #2 | Status: CN

## 2020-11-28 MED FILL — Lactulose (Encephalopathy) Solution 10 GM/15ML: ORAL | 21 days supply | Qty: 1892 | Fill #2 | Status: AC

## 2020-12-06 ENCOUNTER — Other Ambulatory Visit: Payer: Self-pay

## 2020-12-06 MED ORDER — XIFAXAN 550 MG PO TABS
550.0000 mg | ORAL_TABLET | Freq: Two times a day (BID) | ORAL | 3 refills | Status: DC
  Filled 2020-12-06: qty 180, 90d supply, fill #0
  Filled 2021-03-31: qty 180, 90d supply, fill #1

## 2020-12-06 MED FILL — Insulin Glulisine Soln Pen-Injector Inj 100 Unit/ML: SUBCUTANEOUS | 120 days supply | Qty: 90 | Fill #1 | Status: CN

## 2020-12-25 ENCOUNTER — Other Ambulatory Visit: Payer: Self-pay

## 2020-12-26 LAB — MICROSCOPIC EXAMINATION
Bacteria, UA: NONE SEEN
Casts: NONE SEEN /lpf
RBC, Urine: NONE SEEN /hpf (ref 0–2)

## 2020-12-26 LAB — URINALYSIS, ROUTINE W REFLEX MICROSCOPIC
Bilirubin, UA: NEGATIVE
Glucose, UA: NEGATIVE
Ketones, UA: NEGATIVE
Nitrite, UA: NEGATIVE
Protein,UA: NEGATIVE
RBC, UA: NEGATIVE
Specific Gravity, UA: 1.015 (ref 1.005–1.030)
Urobilinogen, Ur: 0.2 mg/dL (ref 0.2–1.0)
pH, UA: 5.5 (ref 5.0–7.5)

## 2020-12-26 LAB — COMPREHENSIVE METABOLIC PANEL
ALT: 14 IU/L (ref 0–44)
AST: 28 IU/L (ref 0–40)
Albumin/Globulin Ratio: 1.1 — ABNORMAL LOW (ref 1.2–2.2)
Albumin: 3.8 g/dL (ref 3.8–4.9)
Alkaline Phosphatase: 73 IU/L (ref 44–121)
BUN/Creatinine Ratio: 18 (ref 9–20)
BUN: 13 mg/dL (ref 6–24)
Bilirubin Total: 0.6 mg/dL (ref 0.0–1.2)
CO2: 24 mmol/L (ref 20–29)
Calcium: 9.2 mg/dL (ref 8.7–10.2)
Chloride: 102 mmol/L (ref 96–106)
Creatinine, Ser: 0.73 mg/dL — ABNORMAL LOW (ref 0.76–1.27)
Globulin, Total: 3.4 g/dL (ref 1.5–4.5)
Glucose: 219 mg/dL — ABNORMAL HIGH (ref 65–99)
Potassium: 3.5 mmol/L (ref 3.5–5.2)
Sodium: 143 mmol/L (ref 134–144)
Total Protein: 7.2 g/dL (ref 6.0–8.5)
eGFR: 105 mL/min/{1.73_m2} (ref >59)

## 2020-12-26 LAB — CBC
Hematocrit: 41.5 % (ref 37.5–51.0)
Hemoglobin: 14.2 g/dL (ref 13.0–17.7)
MCH: 31.8 pg (ref 26.6–33.0)
MCHC: 34.2 g/dL (ref 31.5–35.7)
MCV: 93 fL (ref 79–97)
Platelets: 100 10*3/uL — CL (ref 150–450)
RBC: 4.47 x10E6/uL (ref 4.14–5.80)
RDW: 13.6 % (ref 11.6–15.4)
WBC: 3.3 10*3/uL — ABNORMAL LOW (ref 3.4–10.8)

## 2020-12-26 LAB — CEA: CEA: 5.4 ng/mL — ABNORMAL HIGH (ref 0.0–4.7)

## 2020-12-26 LAB — TSH: TSH: 4.6 u[IU]/mL — ABNORMAL HIGH (ref 0.450–4.500)

## 2020-12-27 ENCOUNTER — Telehealth: Payer: Self-pay | Admitting: Pharmacist

## 2020-12-27 NOTE — Telephone Encounter (Signed)
12/27/2020 10:25:32 AM - Lantus Solostar refill faxed to [place] - Friday, December 27, 2020 10:24 AM --Michael Mack [employer] refill request for Lantus Solostar Inject 50 units once daily # 3 boxes.

## 2020-12-31 ENCOUNTER — Other Ambulatory Visit: Payer: Self-pay

## 2020-12-31 MED FILL — Lactulose (Encephalopathy) Solution 10 GM/15ML: ORAL | 21 days supply | Qty: 1892 | Fill #3 | Status: AC

## 2021-01-01 ENCOUNTER — Ambulatory Visit: Payer: Self-pay | Admitting: Internal Medicine

## 2021-01-08 ENCOUNTER — Ambulatory Visit: Payer: Self-pay | Admitting: Internal Medicine

## 2021-01-13 ENCOUNTER — Other Ambulatory Visit: Payer: Self-pay | Admitting: Internal Medicine

## 2021-01-13 ENCOUNTER — Other Ambulatory Visit: Payer: Self-pay

## 2021-01-13 DIAGNOSIS — E119 Type 2 diabetes mellitus without complications: Secondary | ICD-10-CM

## 2021-01-13 DIAGNOSIS — Z794 Long term (current) use of insulin: Secondary | ICD-10-CM

## 2021-01-14 ENCOUNTER — Other Ambulatory Visit: Payer: Self-pay

## 2021-01-14 MED ORDER — LANTUS SOLOSTAR 100 UNIT/ML ~~LOC~~ SOPN
PEN_INJECTOR | SUBCUTANEOUS | 0 refills | Status: DC
  Filled 2021-01-14: qty 45, fill #0
  Filled 2021-01-27: qty 45, 90d supply, fill #0

## 2021-01-16 ENCOUNTER — Other Ambulatory Visit: Payer: Self-pay

## 2021-01-22 ENCOUNTER — Ambulatory Visit: Payer: Self-pay | Admitting: Internal Medicine

## 2021-01-22 DIAGNOSIS — K7682 Hepatic encephalopathy: Secondary | ICD-10-CM

## 2021-01-22 DIAGNOSIS — E119 Type 2 diabetes mellitus without complications: Secondary | ICD-10-CM

## 2021-01-22 DIAGNOSIS — J9601 Acute respiratory failure with hypoxia: Secondary | ICD-10-CM

## 2021-01-22 DIAGNOSIS — D696 Thrombocytopenia, unspecified: Secondary | ICD-10-CM

## 2021-01-22 DIAGNOSIS — K7290 Hepatic failure, unspecified without coma: Secondary | ICD-10-CM

## 2021-01-22 DIAGNOSIS — I10 Essential (primary) hypertension: Secondary | ICD-10-CM

## 2021-01-22 DIAGNOSIS — K7460 Unspecified cirrhosis of liver: Secondary | ICD-10-CM

## 2021-01-22 DIAGNOSIS — Z794 Long term (current) use of insulin: Secondary | ICD-10-CM

## 2021-01-22 DIAGNOSIS — G8929 Other chronic pain: Secondary | ICD-10-CM

## 2021-01-22 DIAGNOSIS — C220 Liver cell carcinoma: Secondary | ICD-10-CM

## 2021-01-22 DIAGNOSIS — R970 Elevated carcinoembryonic antigen [CEA]: Secondary | ICD-10-CM | POA: Insufficient documentation

## 2021-01-22 NOTE — Progress Notes (Signed)
Established Patient Office Visit  Subjective:  Patient ID: Michael Mack, male    DOB: 02-Jan-1961  Age: 60 y.o. MRN: [id number]  CC: Follow-Up   HPI Michael Mack presents for routine follow-up of hypertension, chronic pain, diabetes and cirrhosis.  Past Medical History:  Diagnosis Date   Bulging lumbar disc    Diabetes mellitus without complication (HCC)    GERD (gastroesophageal reflux disease)    Hepatic encephalopathy (HCC)    Hepatitis C    Hypertension    Migraines     Past Surgical History:  Procedure Laterality Date   KNEE SURGERY     Right Knee x 3, Left Knee x 1    Family History  Problem Relation Age of Onset   Cancer Mother     Social History   Socioeconomic History   Marital status: Married    Spouse name: Michael Mack   Number of children: 0   Years of education: Not on file   Highest education level: 8th grade  Occupational History   Occupation: unemployed  Tobacco Use   Smoking status: Never   Smokeless tobacco: Never  Substance and Sexual Activity   Alcohol use: No   Drug use: No   Sexual activity: Yes  Other Topics Concern   Not on file  Social History Narrative   Dental problems- Already went to [place] dental care program that we referred him too. Visited [facility] program but was too expensive.   Social Determinants of Health   Financial Resource Strain: Not on file  Food Insecurity: Not on file  Transportation Needs: Not on file  Physical Activity: Not on file  Stress: Not on file  Social Connections: Not on file  Intimate Partner Violence: Not on file    Outpatient Medications Prior to Visit  Medication Sig Dispense Refill   acetaminophen (TYLENOL) 500 MG tablet Take 500 mg by mouth as needed for headache.     amitriptyline (ELAVIL) 50 MG tablet 100 mg at bedtime.     APIDRA SOLOSTAR 100 UNIT/ML Solostar Pen INJECT 35 UNITS UNDER THE SKIN 3 TIMES A DAY WITH MEALS 60 mL 0   APIDRA SOLOSTAR 100 UNIT/ML Solostar Pen INJECT 35  UNITS UNDER THE SKIN 3 TIMES A DAY WITH MEALS 90 mL 1   Cholecalciferol 25 MCG (1000 UT) tablet Take 1,000 Units by mouth daily.     hydrochlorothiazide (HYDRODIURIL) 25 MG tablet TAKE ONE TABLET BY MOUTH EVERY DAY 90 tablet 0   hydrochlorothiazide (HYDRODIURIL) 25 MG tablet TAKE ONE TABLET BY MOUTH EVERY DAY 90 tablet 0   Insulin Pen Needle 32G X 4 MM MISC Use as directed with Insulin 100 each 11   lactulose (CHRONULAC) 10 GM/15ML solution TAKE 30MLS BY MOUTH 3 TIMES A DAY 946 mL 0   lactulose, encephalopathy, (CHRONULAC) 10 GM/15ML SOLN TAKE 30 MLS BY MOUTH 3 TIMES A DAY 2700 mL 11   LANTUS SOLOSTAR 100 UNIT/ML Solostar Pen INJECT 50 UNITS UNDER THE SKIN DAILY AT BEDTIME 45 mL 0   lisinopril (ZESTRIL) 40 MG tablet TAKE ONE TABLET BY MOUTH EVERY DAY 90 tablet 0   lisinopril (ZESTRIL) 40 MG tablet TAKE ONE TABLET BY MOUTH EVERY DAY 90 tablet 1   methadone (DOLOPHINE) 10 MG tablet 30 mg every 8 (eight) hours as needed for moderate pain or severe pain.     multivitamin-iron-minerals-folic acid (CENTRUM) chewable tablet Chew 1 tablet by mouth daily.     naloxone (NARCAN) nasal spray 4 mg/0.1 mL  Place into the nose. (Patient not taking: Reported on 07/19/2020)     oxycodone (ROXICODONE) 30 MG immediate release tablet 40 mg 2 (two) times daily as needed for pain. This is prescribed by his pain management team.     rifaximin (XIFAXAN) 550 MG TABS tablet Take 550 mg by mouth 2 (two) times daily.     XIFAXAN 550 MG TABS tablet Take 1 tablet (550 mg total) by mouth 2 (two) times daily. 180 tablet 3   No facility-administered medications prior to visit.    No Active Allergies  ROS Review of Systems    Objective:    Physical Exam  There were no vitals taken for this visit. Wt Readings from Last 3 Encounters:  12/25/20 264 lb 14.4 oz (120.2 kg)  12/05/19 274 lb 0.5 oz (124.3 kg)  08/30/19 267 lb 3.2 oz (121.2 kg)     Health Maintenance Due  Topic Date Due   Pneumococcal Vaccine 0-64 Years  old (1 - PCV) Never done   FOOT EXAM  Never done   Zoster Vaccines- Shingrix (1 of 2) Never done   COLONOSCOPY (Pts 45-76yrs Insurance coverage will need to be confirmed)  Never done   COVID-19 Vaccine (4 - Booster) 09/26/2020   HEMOGLOBIN A1C  12/25/2020   INFLUENZA VACCINE  01/20/2021    There are no preventive care reminders to display for this patient.  Lab Results  Component Value Date   TSH 4.600 (H) 12/25/2020   Lab Results  Component Value Date   WBC 3.3 (L) 12/25/2020   HGB 14.2 12/25/2020   HCT 41.5 12/25/2020   MCV 93 12/25/2020   PLT 100 (LL) 12/25/2020   Lab Results  Component Value Date   NA 143 12/25/2020   K 3.5 12/25/2020   CO2 24 12/25/2020   GLUCOSE 219 (H) 12/25/2020   BUN 13 12/25/2020   CREATININE 0.73 (L) 12/25/2020   BILITOT 0.6 12/25/2020   ALKPHOS 73 12/25/2020   AST 28 12/25/2020   ALT 14 12/25/2020   PROT 7.2 12/25/2020   ALBUMIN 3.8 12/25/2020   CALCIUM 9.2 12/25/2020   ANIONGAP 11 12/07/2019   EGFR 105 12/25/2020   Lab Results  Component Value Date   CHOL 176 04/03/2020   Lab Results  Component Value Date   HDL 61 04/03/2020   Lab Results  Component Value Date   LDLCALC 97 04/03/2020   Lab Results  Component Value Date   TRIG 100 04/03/2020   Lab Results  Component Value Date   CHOLHDL 2.9 04/03/2020   Lab Results  Component Value Date   HGBA1C 8.4 (H) 06/27/2020      Assessment & Plan:   Problem List Items Addressed This Visit       Cardiovascular and Mediastinum   Hypertension     Digestive   Cirrhosis ([place]) - Primary     Endocrine   Diabetes ([place])     Other   Elevated carcinoembryonic antigen (CEA)    1. Cirrhosis of liver without ascites, unspecified hepatic cirrhosis type ([place]) Pt has MRI pending today at [facility] with follow-up with hid gastroenterologist soon.   2. Elevated carcinoembryonic antigen (CEA) Noted in last labs in July an elevation of his CEA.  3. Primary hypertension Recent reading  suggest BP is close to target. No medication changes recommended. No changes in symptoms  4. Type 2 diabetes mellitus without complication, with long-term current use of insulin ([place]) Recent follow up with his diabetic specialist reports no  new symptoms, no recent A1c recorded. No medication modifications noted.  5. Chronic Pain Recent follow-up with pain specialist at [facility]. No modification to his medications suggested.   Follow-up: Plan follow-up in [place] labs   GDede Query [facility]

## 2021-01-27 ENCOUNTER — Other Ambulatory Visit: Payer: Self-pay

## 2021-01-27 ENCOUNTER — Other Ambulatory Visit: Payer: Self-pay | Admitting: Internal Medicine

## 2021-01-27 MED FILL — Lactulose (Encephalopathy) Solution 10 GM/15ML: ORAL | 21 days supply | Qty: 1892 | Fill #4 | Status: AC

## 2021-01-28 ENCOUNTER — Other Ambulatory Visit: Payer: Self-pay

## 2021-01-28 MED ORDER — HYDROCHLOROTHIAZIDE 25 MG PO TABS
ORAL_TABLET | Freq: Every day | ORAL | 0 refills | Status: DC
  Filled 2021-01-28: qty 90, 90d supply, fill #0

## 2021-01-28 MED ORDER — LISINOPRIL 40 MG PO TABS
ORAL_TABLET | Freq: Every day | ORAL | 1 refills | Status: DC
  Filled 2021-01-28: qty 90, 90d supply, fill #0
  Filled 2021-05-14: qty 90, 90d supply, fill #1

## 2021-02-12 ENCOUNTER — Other Ambulatory Visit: Payer: Self-pay

## 2021-02-12 MED FILL — Insulin Glulisine Soln Pen-Injector Inj 100 Unit/ML: SUBCUTANEOUS | 86 days supply | Qty: 90 | Fill #1 | Status: AC

## 2021-02-13 ENCOUNTER — Other Ambulatory Visit: Payer: Self-pay

## 2021-02-14 ENCOUNTER — Other Ambulatory Visit: Payer: Self-pay

## 2021-02-17 ENCOUNTER — Other Ambulatory Visit: Payer: Self-pay

## 2021-02-27 ENCOUNTER — Other Ambulatory Visit: Payer: Self-pay

## 2021-02-27 MED FILL — Lactulose (Encephalopathy) Solution 10 GM/15ML: ORAL | 21 days supply | Qty: 1892 | Fill #5 | Status: AC

## 2021-03-07 ENCOUNTER — Other Ambulatory Visit: Payer: Self-pay

## 2021-03-17 ENCOUNTER — Telehealth: Payer: Self-pay | Admitting: Pharmacist

## 2021-03-17 NOTE — Telephone Encounter (Signed)
--   Michael Mack - Monday, March 17, 2021 12:32 PM --Lantus Solostar Inject 50 units once daily # 3 boxes refill to [facility].

## 2021-03-20 ENCOUNTER — Telehealth: Payer: Self-pay | Admitting: Pharmacist

## 2021-03-20 NOTE — Telephone Encounter (Signed)
--   Elmer Picker - Thursday, March 20, 2021 8:45 AM --Virgel Gess [employer] refill request for Lantus Solostar Inject 50 units once daily # 3 boxes.

## 2021-03-22 HISTORY — PX: RADIOFREQUENCY ABLATION LIVER TUMOR: SHX2293

## 2021-03-24 ENCOUNTER — Telehealth: Payer: Self-pay | Admitting: Pharmacist

## 2021-03-24 NOTE — Telephone Encounter (Signed)
--   Elmer Picker - Monday, March 24, 2021 1:36 PM --Rose Phi spoke with Alicia--to check on refill I called in 01/30/21 to verify shipped--according to her I called 10 days too early and it did not refill---Refilled today, allow 7-10 business days for shipment to be received at [place] office.

## 2021-03-27 ENCOUNTER — Other Ambulatory Visit: Payer: Self-pay

## 2021-03-27 MED FILL — Lactulose (Encephalopathy) Solution 10 GM/15ML: ORAL | 21 days supply | Qty: 1892 | Fill #6 | Status: AC

## 2021-03-28 ENCOUNTER — Other Ambulatory Visit: Payer: Self-pay

## 2021-03-31 ENCOUNTER — Other Ambulatory Visit: Payer: Self-pay

## 2021-04-01 ENCOUNTER — Other Ambulatory Visit: Payer: Self-pay

## 2021-04-04 ENCOUNTER — Other Ambulatory Visit: Payer: Self-pay | Admitting: Gerontology

## 2021-04-04 ENCOUNTER — Other Ambulatory Visit: Payer: Self-pay

## 2021-04-04 DIAGNOSIS — Z794 Long term (current) use of insulin: Secondary | ICD-10-CM

## 2021-04-04 DIAGNOSIS — E119 Type 2 diabetes mellitus without complications: Secondary | ICD-10-CM

## 2021-04-07 ENCOUNTER — Other Ambulatory Visit: Payer: Self-pay

## 2021-04-08 ENCOUNTER — Other Ambulatory Visit: Payer: Self-pay

## 2021-04-08 MED FILL — Insulin Glargine Soln Pen-Injector 100 Unit/ML: SUBCUTANEOUS | Qty: 45 | Fill #0 | Status: CN

## 2021-04-14 ENCOUNTER — Other Ambulatory Visit: Payer: Self-pay

## 2021-04-15 ENCOUNTER — Other Ambulatory Visit: Payer: Self-pay

## 2021-04-21 ENCOUNTER — Telehealth: Payer: Self-pay | Admitting: Pharmacist

## 2021-04-21 NOTE — Telephone Encounter (Signed)
--   Michael Mack - Monday, April 21, 2021 10:01 AM --Sending refill request to [facility] for Apidra Solostar Inject 35 units under the skin 3 times daily # 6 boxes.

## 2021-04-22 ENCOUNTER — Other Ambulatory Visit: Payer: Self-pay

## 2021-04-22 MED FILL — Insulin Glargine Soln Pen-Injector 100 Unit/ML: SUBCUTANEOUS | Qty: 45 | Fill #0 | Status: CN

## 2021-04-23 ENCOUNTER — Other Ambulatory Visit: Payer: Self-pay

## 2021-04-24 ENCOUNTER — Other Ambulatory Visit: Payer: Self-pay

## 2021-04-28 ENCOUNTER — Other Ambulatory Visit: Payer: Self-pay

## 2021-04-28 MED FILL — Insulin Glargine Soln Pen-Injector 100 Unit/ML: SUBCUTANEOUS | 90 days supply | Qty: 45 | Fill #0 | Status: AC

## 2021-04-29 ENCOUNTER — Other Ambulatory Visit: Payer: Self-pay | Admitting: Gerontology

## 2021-04-29 ENCOUNTER — Other Ambulatory Visit: Payer: Self-pay

## 2021-04-29 DIAGNOSIS — E119 Type 2 diabetes mellitus without complications: Secondary | ICD-10-CM

## 2021-04-29 MED ORDER — INSULIN LISPRO (1 UNIT DIAL) 100 UNIT/ML (KWIKPEN)
35.0000 [IU] | PEN_INJECTOR | Freq: Three times a day (TID) | SUBCUTANEOUS | 2 refills | Status: DC
  Filled 2021-04-29: qty 30, 29d supply, fill #0

## 2021-04-29 MED FILL — Lactulose (Encephalopathy) Solution 10 GM/15ML: ORAL | 21 days supply | Qty: 1892 | Fill #7 | Status: AC

## 2021-05-14 ENCOUNTER — Other Ambulatory Visit: Payer: Self-pay

## 2021-05-14 ENCOUNTER — Other Ambulatory Visit: Payer: Self-pay | Admitting: Gerontology

## 2021-05-14 MED ORDER — HYDROCHLOROTHIAZIDE 25 MG PO TABS
ORAL_TABLET | Freq: Every day | ORAL | 0 refills | Status: DC
  Filled 2021-05-14: qty 90, 90d supply, fill #0

## 2021-05-26 ENCOUNTER — Other Ambulatory Visit: Payer: Self-pay

## 2021-05-26 MED ORDER — APIDRA SOLOSTAR 100 UNIT/ML ~~LOC~~ SOPN
PEN_INJECTOR | SUBCUTANEOUS | 0 refills | Status: DC
  Filled 2021-05-26: qty 90, 86d supply, fill #0

## 2021-05-26 MED FILL — Lactulose (Encephalopathy) Solution 10 GM/15ML: ORAL | 11 days supply | Qty: 946 | Fill #8 | Status: AC

## 2021-06-13 ENCOUNTER — Telehealth: Payer: Self-pay | Admitting: Pharmacist

## 2021-06-13 NOTE — Telephone Encounter (Signed)
06/13/2021 9:27:01 AM - Lantus & Apidra solostar refill to dr  -- Elmer Picker - Friday, June 13, 2021 9:24 AM --Lysbeth Penner per representative the patient has 1 refill left on each-Lantus Solostar & Lobbyist. I have printed refill request and put in the [facility] folder for [facility] to sign.

## 2021-06-20 ENCOUNTER — Other Ambulatory Visit: Payer: Self-pay

## 2021-06-20 ENCOUNTER — Other Ambulatory Visit: Payer: Self-pay | Admitting: Gerontology

## 2021-06-24 ENCOUNTER — Other Ambulatory Visit: Payer: Self-pay | Admitting: Gerontology

## 2021-06-24 ENCOUNTER — Other Ambulatory Visit: Payer: Self-pay

## 2021-06-24 DIAGNOSIS — K7460 Unspecified cirrhosis of liver: Secondary | ICD-10-CM

## 2021-06-24 MED ORDER — LACTULOSE 10 GM/15ML PO SOLN
ORAL | 11 refills | Status: DC
  Filled 2021-06-24: qty 2365, 27d supply, fill #0
  Filled 2021-07-30: qty 1892, 21d supply, fill #1
  Filled 2021-08-26: qty 2700, 30d supply, fill #2
  Filled 2021-08-26: qty 1892, 21d supply, fill #2
  Filled 2021-09-24: qty 1892, 21d supply, fill #3
  Filled 2021-10-20: qty 2365, 27d supply, fill #4
  Filled 2021-11-20: qty 2365, 27d supply, fill #0
  Filled 2021-12-24: qty 2838, 32d supply, fill #1
  Filled 2022-01-20: qty 2838, 32d supply, fill #2
  Filled 2022-02-25: qty 2838, 32d supply, fill #3
  Filled 2022-04-01: qty 2838, 30d supply, fill #4
  Filled 2022-04-28: qty 2365, 26d supply, fill #5
  Filled 2022-06-01: qty 2365, 26d supply, fill #6

## 2021-06-24 MED FILL — Lactulose (Encephalopathy) Solution 10 GM/15ML: ORAL | 11 days supply | Qty: 946 | Fill #9 | Status: CN

## 2021-06-25 ENCOUNTER — Other Ambulatory Visit: Payer: Self-pay

## 2021-06-25 DIAGNOSIS — I10 Essential (primary) hypertension: Secondary | ICD-10-CM

## 2021-06-25 DIAGNOSIS — R970 Elevated carcinoembryonic antigen [CEA]: Secondary | ICD-10-CM

## 2021-06-25 DIAGNOSIS — E119 Type 2 diabetes mellitus without complications: Secondary | ICD-10-CM

## 2021-06-26 ENCOUNTER — Other Ambulatory Visit: Payer: Self-pay

## 2021-06-26 ENCOUNTER — Other Ambulatory Visit: Payer: Self-pay | Admitting: Gerontology

## 2021-06-26 DIAGNOSIS — E119 Type 2 diabetes mellitus without complications: Secondary | ICD-10-CM

## 2021-06-26 MED FILL — Insulin Glargine Soln Pen-Injector 100 Unit/ML: SUBCUTANEOUS | 90 days supply | Qty: 45 | Fill #0 | Status: CN

## 2021-06-27 ENCOUNTER — Other Ambulatory Visit: Payer: Self-pay

## 2021-07-01 LAB — COMPREHENSIVE METABOLIC PANEL WITH GFR
ALT: 19 IU/L (ref 0–44)
AST: 30 IU/L (ref 0–40)
Albumin/Globulin Ratio: 1.4 (ref 1.2–2.2)
Albumin: 4.7 g/dL (ref 3.8–4.9)
Alkaline Phosphatase: 79 IU/L (ref 44–121)
BUN/Creatinine Ratio: 18 (ref 10–24)
BUN: 16 mg/dL (ref 8–27)
Bilirubin Total: 0.8 mg/dL (ref 0.0–1.2)
CO2: 22 mmol/L (ref 20–29)
Calcium: 9.5 mg/dL (ref 8.6–10.2)
Chloride: 102 mmol/L (ref 96–106)
Creatinine, Ser: 0.91 mg/dL (ref 0.76–1.27)
Globulin, Total: 3.3 g/dL (ref 1.5–4.5)
Glucose: 39 mg/dL — CL (ref 70–99)
Potassium: 3.9 mmol/L (ref 3.5–5.2)
Sodium: 143 mmol/L (ref 134–144)
Total Protein: 8 g/dL (ref 6.0–8.5)
eGFR: 96 mL/min/1.73 (ref >59)

## 2021-07-01 LAB — CBC WITH DIFFERENTIAL/PLATELET
Basophils Absolute: 0.1 10*3/uL (ref 0.0–0.2)
Basos: 1 %
EOS (ABSOLUTE): 0.2 10*3/uL (ref 0.0–0.4)
Eos: 3 %
Hematocrit: 47.1 % (ref 37.5–51.0)
Hemoglobin: 16.2 g/dL (ref 13.0–17.7)
Immature Grans (Abs): 0 10*3/uL (ref 0.0–0.1)
Immature Granulocytes: 0 %
Lymphocytes Absolute: 2.5 10*3/uL (ref 0.7–3.1)
Lymphs: 33 %
MCH: 30.7 pg (ref 26.6–33.0)
MCHC: 34.4 g/dL (ref 31.5–35.7)
MCV: 89 fL (ref 79–97)
Monocytes Absolute: 0.6 10*3/uL (ref 0.1–0.9)
Monocytes: 8 %
Neutrophils Absolute: 4.2 10*3/uL (ref 1.4–7.0)
Neutrophils: 55 %
Platelets: 143 10*3/uL — ABNORMAL LOW (ref 150–450)
RBC: 5.28 x10E6/uL (ref 4.14–5.80)
RDW: 13.4 % (ref 11.6–15.4)
WBC: 7.6 10*3/uL (ref 3.4–10.8)

## 2021-07-01 LAB — URINALYSIS, ROUTINE W REFLEX MICROSCOPIC
Bilirubin, UA: NEGATIVE
Glucose, UA: NEGATIVE
Ketones, UA: NEGATIVE
Nitrite, UA: NEGATIVE
Protein,UA: NEGATIVE
RBC, UA: NEGATIVE
Specific Gravity, UA: 1.021 (ref 1.005–1.030)
Urobilinogen, Ur: 0.2 mg/dL (ref 0.2–1.0)
pH, UA: 5.5 (ref 5.0–7.5)

## 2021-07-01 LAB — LIPID PANEL
Chol/HDL Ratio: 3.6 ratio (ref 0.0–5.0)
Cholesterol, Total: 182 mg/dL (ref 100–199)
HDL: 51 mg/dL (ref >39)
LDL Chol Calc (NIH): 112 mg/dL — ABNORMAL HIGH (ref 0–99)
Triglycerides: 105 mg/dL (ref 0–149)
VLDL Cholesterol Cal: 19 mg/dL (ref 5–40)

## 2021-07-01 LAB — MICROSCOPIC EXAMINATION
Bacteria, UA: NONE SEEN
Casts: NONE SEEN /lpf
RBC, Urine: NONE SEEN /hpf (ref 0–2)
WBC, UA: >30 /hpf — AB (ref 0–5)

## 2021-07-01 LAB — PSA: Prostate Specific Ag, Serum: <0.1 ng/mL (ref 0.0–4.0)

## 2021-07-01 LAB — TSH: TSH: 3.4 u[IU]/mL (ref 0.450–4.500)

## 2021-07-01 LAB — CEA: CEA: 5.1 ng/mL — ABNORMAL HIGH (ref 0.0–4.7)

## 2021-07-01 LAB — HEMOGLOBIN A1C
Est. average glucose Bld gHb Est-mCnc: 171 mg/dL
Hgb A1c MFr Bld: 7.6 % — ABNORMAL HIGH (ref 4.8–5.6)

## 2021-07-02 ENCOUNTER — Other Ambulatory Visit: Payer: Self-pay

## 2021-07-02 ENCOUNTER — Encounter: Payer: Self-pay | Admitting: Internal Medicine

## 2021-07-02 ENCOUNTER — Ambulatory Visit: Payer: Self-pay | Admitting: Internal Medicine

## 2021-07-02 VITALS — BP 149/77 | HR 96 | Temp 98.1°F | Resp 18 | Ht 68.0 in | Wt 270.3 lb

## 2021-07-02 DIAGNOSIS — J9601 Acute respiratory failure with hypoxia: Secondary | ICD-10-CM

## 2021-07-02 DIAGNOSIS — I10 Essential (primary) hypertension: Secondary | ICD-10-CM

## 2021-07-02 DIAGNOSIS — D696 Thrombocytopenia, unspecified: Secondary | ICD-10-CM

## 2021-07-02 DIAGNOSIS — E119 Type 2 diabetes mellitus without complications: Secondary | ICD-10-CM

## 2021-07-02 DIAGNOSIS — K7460 Unspecified cirrhosis of liver: Secondary | ICD-10-CM

## 2021-07-02 DIAGNOSIS — Z794 Long term (current) use of insulin: Secondary | ICD-10-CM

## 2021-07-02 NOTE — Progress Notes (Signed)
Established Patient Office Visit  Subjective:  Patient ID: Michael Mack, male    DOB: 31-Dec-1960  Age: 61 y.o. MRN: [id number]  CC:  Chief Complaint  Patient presents with   Follow-up    HPI Michael Mack presents for routine follow up of multiple problems. Patient's recent lab work on 06/25/21 showed a critical low glucose of 39. Patient denies any symptoms of low blood sugar. A1C is stable at 7.6. Patient taking insulin as prescribed. Overall patient is stable.  Patient states he has an MRI of his liver scheduled on Oct 23 2021.  Past Medical History:  Diagnosis Date   Bulging lumbar disc    Diabetes mellitus without complication (HCC)    GERD (gastroesophageal reflux disease)    Hepatic encephalopathy    Hepatitis C    Hypertension    Migraines     Past Surgical History:  Procedure Laterality Date   KNEE SURGERY     Right Knee x 3, Left Knee x 1    Family History  Problem Relation Age of Onset   COPD Mother    Cancer Mother    Heart attack Father 64    Social History   Socioeconomic History   Marital status: Married    Spouse name: Keithan Dileonardo   Number of children: 0   Years of education: Not on file   Highest education level: 8th grade  Occupational History   Occupation: unemployed  Tobacco Use   Smoking status: Never   Smokeless tobacco: Never  Vaping Use   Vaping Use: Never used  Substance and Sexual Activity   Alcohol use: No   Drug use: No   Sexual activity: Yes  Other Topics Concern   Not on file  Social History Narrative   Dental problems- Already went to [place] dental care program that we referred him too. Visited [facility] program but was too expensive.   Social Determinants of Health   Financial Resource Strain: Not on file  Food Insecurity: Not on file  Transportation Needs: Not on file  Physical Activity: Not on file  Stress: Not on file  Social Connections: Not on file  Intimate Partner Violence: Not on file    Outpatient  Medications Prior to Visit  Medication Sig Dispense Refill   acetaminophen (TYLENOL) 500 MG tablet Take 500 mg by mouth as needed for headache.     amitriptyline (ELAVIL) 50 MG tablet 100 mg at bedtime.     Cholecalciferol 25 MCG (1000 UT) tablet Take 1,000 Units by mouth daily.     hydrochlorothiazide (HYDRODIURIL) 25 MG tablet TAKE ONE TABLET BY MOUTH EVERY DAY 90 tablet 0   insulin glulisine (APIDRA SOLOSTAR) 100 UNIT/ML Solostar Pen Inject 35 units under the skin 3 times a day. 90 mL 0   Insulin Pen Needle 32G X 4 MM MISC Use as directed with Insulin 100 each 11   lactulose (CHRONULAC) 10 GM/15ML solution TAKE 30ML BY MOUTH 3 TIMES DAILY. 2700 mL 11   LANTUS SOLOSTAR 100 UNIT/ML Solostar Pen INJECT 50 UNITS UNDER THE SKIN DAILY AT BEDTIME 45 mL 0   lisinopril (ZESTRIL) 40 MG tablet TAKE ONE TABLET BY MOUTH EVERY DAY 90 tablet 1   methadone (DOLOPHINE) 10 MG tablet 30 mg every 8 (eight) hours as needed for moderate pain or severe pain.     multivitamin-iron-minerals-folic acid (CENTRUM) chewable tablet Chew 1 tablet by mouth daily.     naloxone (NARCAN) nasal spray 4 mg/0.1 mL Place into  the nose.     Oxycodone HCl 20 MG TABS Take 1 tablet by mouth every 4 (four) hours as needed. Prescribed by pain management     XIFAXAN 550 MG TABS tablet Take 1 tablet (550 mg total) by mouth 2 (two) times daily. 180 tablet 3   oxycodone (ROXICODONE) 30 MG immediate release tablet 40 mg 2 (two) times daily as needed for pain. This is prescribed by his pain management team.     rifaximin (XIFAXAN) 550 MG TABS tablet Take 550 mg by mouth 2 (two) times daily.     No facility-administered medications prior to visit.    No Known Allergies  ROS Review of Systems    Objective:    Physical Exam  BP (!) 149/77 (BP Location: Left Arm, Patient Position: Sitting, Cuff Size: Large)    Pulse 96    Temp 98.1 F (36.7 C) (Oral)    Resp 18    Ht '5\' 8"'  (1.727 m)    Wt 270 lb 4.8 oz (122.6 kg)    SpO2 90%    BMI  41.10 kg/m  Wt Readings from Last 3 Encounters:  07/02/21 270 lb 4.8 oz (122.6 kg)  06/25/21 253 lb 11.2 oz (115.1 kg)  12/25/20 264 lb 14.4 oz (120.2 kg)     Health Maintenance Due  Topic Date Due   Pneumococcal Vaccine 66-58 Years old (1 - PCV) Never done   FOOT EXAM  Never done   Zoster Vaccines- Shingrix (1 of 2) Never done   COLONOSCOPY (Pts 45-70yr Insurance coverage will need to be confirmed)  Never done   COVID-19 Vaccine (4 - Booster) 08/23/2020   INFLUENZA VACCINE  01/20/2021    There are no preventive care reminders to display for this patient.  Lab Results  Component Value Date   TSH 3.400 06/25/2021   Lab Results  Component Value Date   WBC 7.6 06/25/2021   HGB 16.2 06/25/2021   HCT 47.1 06/25/2021   MCV 89 06/25/2021   PLT 143 (L) 06/25/2021   Lab Results  Component Value Date   NA 143 06/25/2021   K 3.9 06/25/2021   CO2 22 06/25/2021   GLUCOSE 39 (LL) 06/25/2021   BUN 16 06/25/2021   CREATININE 0.91 06/25/2021   BILITOT 0.8 06/25/2021   ALKPHOS 79 06/25/2021   AST 30 06/25/2021   ALT 19 06/25/2021   PROT 8.0 06/25/2021   ALBUMIN 4.7 06/25/2021   CALCIUM 9.5 06/25/2021   ANIONGAP 11 12/07/2019   EGFR 96 06/25/2021   Lab Results  Component Value Date   CHOL 182 06/25/2021   Lab Results  Component Value Date   HDL 51 06/25/2021   Lab Results  Component Value Date   LDLCALC 112 (H) 06/25/2021   Lab Results  Component Value Date   TRIG 105 06/25/2021   Lab Results  Component Value Date   CHOLHDL 3.6 06/25/2021   Lab Results  Component Value Date   HGBA1C 7.6 (H) 06/25/2021      Assessment & Plan:   Problem List Items Addressed This Visit       Cardiovascular and Mediastinum   Hypertension     Digestive   Cirrhosis ([place]     Endocrine   Diabetes ([place] - Primary   1. Type 2 diabetes mellitus without complication, with long-term current use of insulin ([place] [facility] A1C appears to be in the  normal range. Recent random sugar in our office around 11am captured a  low glucose value of <70. Patient thinks he may have taken his insulin too early. Reminded patient to check his glucose level before meals and before taking his insulin.  2. Cirrhosis of liver without ascites, unspecified hepatic cirrhosis type ([place]) Appears to be stable. Followed by [facility] liver clinic. Patient has MRI of liver scheduled on 10/23/20.  3. Primary hypertension Appears to be stable no medication changes.  4. Acute respiratory failure No history of reoccurance. Apparently was triggered by over medication of his pain medication.  5. Thrombocytopenia Current labs show significant improvement in platelet count, unsure why. In the past was thought to be related to his cirrhosis   Follow-up:  Follow up in 4 months with labs prior.   Sonnie Alamo, CMA

## 2021-07-17 ENCOUNTER — Other Ambulatory Visit: Payer: Self-pay

## 2021-07-17 ENCOUNTER — Other Ambulatory Visit: Payer: Self-pay | Admitting: Internal Medicine

## 2021-07-17 DIAGNOSIS — E119 Type 2 diabetes mellitus without complications: Secondary | ICD-10-CM

## 2021-07-17 MED FILL — Insulin Lispro Soln Pen-injector 100 Unit/ML (1 Unit Dial): SUBCUTANEOUS | Qty: 15 | Fill #0 | Status: CN

## 2021-07-23 ENCOUNTER — Emergency Department: Payer: Self-pay

## 2021-07-23 ENCOUNTER — Other Ambulatory Visit: Payer: Self-pay

## 2021-07-23 ENCOUNTER — Encounter: Payer: Self-pay | Admitting: Radiology

## 2021-07-23 ENCOUNTER — Inpatient Hospital Stay
Admission: EM | Admit: 2021-07-23 | Discharge: 2021-07-25 | DRG: 280 | Disposition: A | Discharge status: Home / Self Care | Payer: Self-pay | Attending: Internal Medicine | Admitting: Internal Medicine

## 2021-07-23 DIAGNOSIS — B1920 Unspecified viral hepatitis C without hepatic coma: Secondary | ICD-10-CM | POA: Diagnosis present

## 2021-07-23 DIAGNOSIS — I10 Essential (primary) hypertension: Secondary | ICD-10-CM | POA: Diagnosis present

## 2021-07-23 DIAGNOSIS — E1165 Type 2 diabetes mellitus with hyperglycemia: Secondary | ICD-10-CM | POA: Diagnosis present

## 2021-07-23 DIAGNOSIS — Z8505 Personal history of malignant neoplasm of liver: Secondary | ICD-10-CM

## 2021-07-23 DIAGNOSIS — D696 Thrombocytopenia, unspecified: Secondary | ICD-10-CM | POA: Diagnosis present

## 2021-07-23 DIAGNOSIS — Z560 Unemployment, unspecified: Secondary | ICD-10-CM

## 2021-07-23 DIAGNOSIS — K7460 Unspecified cirrhosis of liver: Secondary | ICD-10-CM | POA: Diagnosis present

## 2021-07-23 DIAGNOSIS — F32A Depression, unspecified: Secondary | ICD-10-CM | POA: Diagnosis present

## 2021-07-23 DIAGNOSIS — C220 Liver cell carcinoma: Secondary | ICD-10-CM | POA: Diagnosis present

## 2021-07-23 DIAGNOSIS — R7989 Other specified abnormal findings of blood chemistry: Secondary | ICD-10-CM

## 2021-07-23 DIAGNOSIS — K766 Portal hypertension: Secondary | ICD-10-CM | POA: Diagnosis present

## 2021-07-23 DIAGNOSIS — K7682 Hepatic encephalopathy: Secondary | ICD-10-CM | POA: Diagnosis present

## 2021-07-23 DIAGNOSIS — J9621 Acute and chronic respiratory failure with hypoxia: Secondary | ICD-10-CM | POA: Diagnosis present

## 2021-07-23 DIAGNOSIS — G8929 Other chronic pain: Secondary | ICD-10-CM | POA: Diagnosis present

## 2021-07-23 DIAGNOSIS — Z794 Long term (current) use of insulin: Secondary | ICD-10-CM

## 2021-07-23 DIAGNOSIS — Z8249 Family history of ischemic heart disease and other diseases of the circulatory system: Secondary | ICD-10-CM

## 2021-07-23 DIAGNOSIS — G9341 Metabolic encephalopathy: Secondary | ICD-10-CM | POA: Diagnosis present

## 2021-07-23 DIAGNOSIS — J9622 Acute and chronic respiratory failure with hypercapnia: Secondary | ICD-10-CM | POA: Diagnosis present

## 2021-07-23 DIAGNOSIS — F1190 Opioid use, unspecified, uncomplicated: Secondary | ICD-10-CM

## 2021-07-23 DIAGNOSIS — E669 Obesity, unspecified: Secondary | ICD-10-CM | POA: Diagnosis present

## 2021-07-23 DIAGNOSIS — Z20822 Contact with and (suspected) exposure to covid-19: Secondary | ICD-10-CM | POA: Diagnosis present

## 2021-07-23 DIAGNOSIS — I868 Varicose veins of other specified sites: Secondary | ICD-10-CM | POA: Diagnosis present

## 2021-07-23 DIAGNOSIS — Z79891 Long term (current) use of opiate analgesic: Secondary | ICD-10-CM

## 2021-07-23 DIAGNOSIS — I214 Non-ST elevation (NSTEMI) myocardial infarction: Principal | ICD-10-CM | POA: Diagnosis present

## 2021-07-23 DIAGNOSIS — E119 Type 2 diabetes mellitus without complications: Secondary | ICD-10-CM | POA: Diagnosis present

## 2021-07-23 DIAGNOSIS — J9601 Acute respiratory failure with hypoxia: Secondary | ICD-10-CM

## 2021-07-23 DIAGNOSIS — F419 Anxiety disorder, unspecified: Secondary | ICD-10-CM | POA: Diagnosis present

## 2021-07-23 DIAGNOSIS — K219 Gastro-esophageal reflux disease without esophagitis: Secondary | ICD-10-CM | POA: Diagnosis present

## 2021-07-23 DIAGNOSIS — Z79899 Other long term (current) drug therapy: Secondary | ICD-10-CM

## 2021-07-23 HISTORY — DX: Thrombocytopenia, unspecified: D69.6

## 2021-07-23 HISTORY — DX: Other chronic pain: G89.29

## 2021-07-23 HISTORY — DX: Liver cell carcinoma: C22.0

## 2021-07-23 HISTORY — DX: Morbid (severe) obesity due to excess calories: E66.01

## 2021-07-23 LAB — COMPREHENSIVE METABOLIC PANEL
ALT: 20 U/L (ref 0–44)
AST: 39 U/L (ref 15–41)
Albumin: 3.9 g/dL (ref 3.5–5.0)
Alkaline Phosphatase: 61 U/L (ref 38–126)
Anion gap: 10 (ref 5–15)
BUN: 19 mg/dL (ref 6–20)
CO2: 27 mmol/L (ref 22–32)
Calcium: 9.2 mg/dL (ref 8.9–10.3)
Chloride: 103 mmol/L (ref 98–111)
Creatinine, Ser: 1 mg/dL (ref 0.61–1.24)
GFR, Estimated: >60 mL/min (ref >60)
Glucose, Bld: 218 mg/dL — ABNORMAL HIGH (ref 70–99)
Potassium: 3.8 mmol/L (ref 3.5–5.1)
Sodium: 140 mmol/L (ref 135–145)
Total Bilirubin: 0.7 mg/dL (ref 0.3–1.2)
Total Protein: 7.8 g/dL (ref 6.5–8.1)

## 2021-07-23 LAB — BLOOD GAS, VENOUS
Acid-Base Excess: 2.2 mmol/L — ABNORMAL HIGH (ref 0.0–2.0)
Bicarbonate: 30.5 mmol/L — ABNORMAL HIGH (ref 20.0–28.0)
O2 Saturation: 83.9 %
Patient temperature: 37
pCO2, Ven: 62 mmHg — ABNORMAL HIGH (ref 44.0–60.0)
pH, Ven: 7.3 (ref 7.250–7.430)
pO2, Ven: 54 mmHg — ABNORMAL HIGH (ref 32.0–45.0)

## 2021-07-23 LAB — CBC WITH DIFFERENTIAL/PLATELET
Abs Immature Granulocytes: 0.04 10*3/uL (ref 0.00–0.07)
Basophils Absolute: 0 10*3/uL (ref 0.0–0.1)
Basophils Relative: 1 %
Eosinophils Absolute: 0.2 10*3/uL (ref 0.0–0.5)
Eosinophils Relative: 2 %
HCT: 44.3 % (ref 39.0–52.0)
Hemoglobin: 15 g/dL (ref 13.0–17.0)
Immature Granulocytes: 1 %
Lymphocytes Relative: 17 %
Lymphs Abs: 1.4 10*3/uL (ref 0.7–4.0)
MCH: 30.8 pg (ref 26.0–34.0)
MCHC: 33.9 g/dL (ref 30.0–36.0)
MCV: 91 fL (ref 80.0–100.0)
Monocytes Absolute: 0.5 10*3/uL (ref 0.1–1.0)
Monocytes Relative: 7 %
Neutro Abs: 6.1 10*3/uL (ref 1.7–7.7)
Neutrophils Relative %: 72 %
Platelets: 104 10*3/uL — ABNORMAL LOW (ref 150–400)
RBC: 4.87 MIL/uL (ref 4.22–5.81)
RDW: 13.2 % (ref 11.5–15.5)
WBC: 8.3 10*3/uL (ref 4.0–10.5)
nRBC: 0 % (ref 0.0–0.2)

## 2021-07-23 LAB — CBG MONITORING, ED: Glucose-Capillary: 208 mg/dL — ABNORMAL HIGH (ref 70–99)

## 2021-07-23 LAB — TROPONIN I (HIGH SENSITIVITY): Troponin I (High Sensitivity): 58 ng/L — ABNORMAL HIGH (ref <18)

## 2021-07-23 LAB — BRAIN NATRIURETIC PEPTIDE: B Natriuretic Peptide: 29.9 pg/mL (ref 0.0–100.0)

## 2021-07-23 LAB — RESP PANEL BY RT-PCR (FLU A&B, COVID) ARPGX2
Influenza A by PCR: NEGATIVE
Influenza B by PCR: NEGATIVE
SARS Coronavirus 2 by RT PCR: NEGATIVE

## 2021-07-23 LAB — AMMONIA: Ammonia: 107 umol/L — ABNORMAL HIGH (ref 9–35)

## 2021-07-23 MED ORDER — IOHEXOL 350 MG/ML SOLN
100.0000 mL | Freq: Once | INTRAVENOUS | Status: AC | PRN
  Administered 2021-07-23: 100 mL via INTRAVENOUS

## 2021-07-23 MED ORDER — LACTULOSE 10 GM/15ML PO SOLN
30.0000 g | Freq: Once | ORAL | Status: AC
  Administered 2021-07-24: 30 g via ORAL
  Filled 2021-07-23: qty 60

## 2021-07-23 MED ORDER — IPRATROPIUM-ALBUTEROL 0.5-2.5 (3) MG/3ML IN SOLN
3.0000 mL | Freq: Once | RESPIRATORY_TRACT | Status: AC
  Administered 2021-07-24: 3 mL via RESPIRATORY_TRACT
  Filled 2021-07-23: qty 3

## 2021-07-23 MED ORDER — IPRATROPIUM-ALBUTEROL 0.5-2.5 (3) MG/3ML IN SOLN
3.0000 mL | Freq: Once | RESPIRATORY_TRACT | Status: AC
  Administered 2021-07-23: 3 mL via RESPIRATORY_TRACT
  Filled 2021-07-23: qty 3
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
## 2021-07-24 ENCOUNTER — Encounter: Payer: Self-pay | Admitting: Internal Medicine

## 2021-07-24 ENCOUNTER — Inpatient Hospital Stay (HOSPITAL_COMMUNITY)
Admit: 2021-07-24 | Discharge: 2021-07-24 | Disposition: A | Discharge status: Home / Self Care | Payer: Self-pay | Attending: Cardiovascular Disease | Admitting: Cardiovascular Disease

## 2021-07-24 DIAGNOSIS — I248 Other forms of acute ischemic heart disease: Secondary | ICD-10-CM

## 2021-07-24 DIAGNOSIS — I214 Non-ST elevation (NSTEMI) myocardial infarction: Secondary | ICD-10-CM

## 2021-07-24 DIAGNOSIS — F1190 Opioid use, unspecified, uncomplicated: Secondary | ICD-10-CM

## 2021-07-24 DIAGNOSIS — J9601 Acute respiratory failure with hypoxia: Secondary | ICD-10-CM

## 2021-07-24 DIAGNOSIS — Z794 Long term (current) use of insulin: Secondary | ICD-10-CM

## 2021-07-24 DIAGNOSIS — G9341 Metabolic encephalopathy: Secondary | ICD-10-CM

## 2021-07-24 DIAGNOSIS — R7989 Other specified abnormal findings of blood chemistry: Secondary | ICD-10-CM

## 2021-07-24 DIAGNOSIS — E66813 Obesity, class 3: Secondary | ICD-10-CM

## 2021-07-24 DIAGNOSIS — E119 Type 2 diabetes mellitus without complications: Secondary | ICD-10-CM

## 2021-07-24 LAB — BASIC METABOLIC PANEL
Anion gap: 6 (ref 5–15)
BUN: 24 mg/dL — ABNORMAL HIGH (ref 6–20)
CO2: 27 mmol/L (ref 22–32)
Calcium: 8.9 mg/dL (ref 8.9–10.3)
Chloride: 107 mmol/L (ref 98–111)
Creatinine, Ser: 0.97 mg/dL (ref 0.61–1.24)
GFR, Estimated: >60 mL/min (ref >60)
Glucose, Bld: 197 mg/dL — ABNORMAL HIGH (ref 70–99)
Potassium: 4 mmol/L (ref 3.5–5.1)
Sodium: 140 mmol/L (ref 135–145)

## 2021-07-24 LAB — CBC
HCT: 36.6 % — ABNORMAL LOW (ref 39.0–52.0)
Hemoglobin: 12.5 g/dL — ABNORMAL LOW (ref 13.0–17.0)
MCH: 31.1 pg (ref 26.0–34.0)
MCHC: 34.2 g/dL (ref 30.0–36.0)
MCV: 91 fL (ref 80.0–100.0)
Platelets: 86 10*3/uL — ABNORMAL LOW (ref 150–400)
RBC: 4.02 MIL/uL — ABNORMAL LOW (ref 4.22–5.81)
RDW: 13.3 % (ref 11.5–15.5)
WBC: 5.8 10*3/uL (ref 4.0–10.5)
nRBC: 0 % (ref 0.0–0.2)

## 2021-07-24 LAB — PROTIME-INR
INR: 1.2 (ref 0.8–1.2)
Prothrombin Time: 15.2 seconds (ref 11.4–15.2)

## 2021-07-24 LAB — CBG MONITORING, ED
Glucose-Capillary: 197 mg/dL — ABNORMAL HIGH (ref 70–99)
Glucose-Capillary: 176 mg/dL — ABNORMAL HIGH (ref 70–99)
Glucose-Capillary: 306 mg/dL — ABNORMAL HIGH (ref 70–99)
Glucose-Capillary: 185 mg/dL — ABNORMAL HIGH (ref 70–99)
Glucose-Capillary: 173 mg/dL — ABNORMAL HIGH (ref 70–99)

## 2021-07-24 LAB — HIV ANTIBODY (ROUTINE TESTING W REFLEX): HIV Screen 4th Generation wRfx: NONREACTIVE

## 2021-07-24 LAB — LIPID PANEL
Cholesterol: 129 mg/dL (ref 0–200)
HDL: 43 mg/dL (ref >40)
LDL Cholesterol: 73 mg/dL (ref 0–99)
Total CHOL/HDL Ratio: 3 RATIO
Triglycerides: 64 mg/dL (ref <150)
VLDL: 13 mg/dL (ref 0–40)

## 2021-07-24 LAB — TROPONIN I (HIGH SENSITIVITY)
Troponin I (High Sensitivity): 708 ng/L (ref <18)
Troponin I (High Sensitivity): 1611 ng/L (ref <18)

## 2021-07-24 LAB — ECHOCARDIOGRAM COMPLETE
Height: 65 in
S' Lateral: 2.3 cm
Weight: 4000 oz

## 2021-07-24 LAB — HEPARIN LEVEL (UNFRACTIONATED)
Heparin Unfractionated: 0.14 IU/mL — ABNORMAL LOW (ref 0.30–0.70)
Heparin Unfractionated: 0.14 IU/mL — ABNORMAL LOW (ref 0.30–0.70)

## 2021-07-24 LAB — APTT: aPTT: 29 seconds (ref 24–36)

## 2021-07-24 MED ORDER — METHADONE HCL 10 MG PO TABS
30.0000 mg | ORAL_TABLET | Freq: Three times a day (TID) | ORAL | Status: DC | PRN
Start: 2021-07-24 — End: 2021-07-25
  Administered 2021-07-24: 30 mg via ORAL
  Filled 2021-07-24: qty 3

## 2021-07-24 MED ORDER — HEPARIN BOLUS VIA INFUSION
2600.0000 [IU] | Freq: Once | INTRAVENOUS | Status: AC
  Administered 2021-07-24: 2600 [IU] via INTRAVENOUS
  Filled 2021-07-24: qty 2600

## 2021-07-24 MED ORDER — RIFAXIMIN 550 MG PO TABS
550.0000 mg | ORAL_TABLET | Freq: Two times a day (BID) | ORAL | Status: DC
  Administered 2021-07-24 (×2): 550 mg via ORAL
  Filled 2021-07-24 (×5): qty 1

## 2021-07-24 MED ORDER — FUROSEMIDE 40 MG PO TABS
20.0000 mg | ORAL_TABLET | Freq: Once | ORAL | Status: AC
  Administered 2021-07-24: 20 mg via ORAL
  Filled 2021-07-24: qty 1

## 2021-07-24 MED ORDER — ASPIRIN EC 81 MG PO TBEC: 81.0000 mg | DELAYED_RELEASE_TABLET | Freq: Every day | ORAL | Status: DC

## 2021-07-24 MED ORDER — LACTULOSE 10 GM/15ML PO SOLN
20.0000 g | Freq: Three times a day (TID) | ORAL | Status: DC
  Administered 2021-07-24 (×3): 20 g via ORAL
  Filled 2021-07-24 (×3): qty 30

## 2021-07-24 MED ORDER — METOPROLOL TARTRATE 25 MG PO TABS
12.5000 mg | ORAL_TABLET | Freq: Two times a day (BID) | ORAL | Status: DC
  Administered 2021-07-24 (×2): 12.5 mg via ORAL
  Filled 2021-07-24 (×2): qty 1

## 2021-07-24 MED ORDER — FUROSEMIDE 10 MG/ML IJ SOLN: 40.0000 mg | Freq: Once | INTRAMUSCULAR | Status: DC

## 2021-07-24 MED ORDER — HEPARIN (PORCINE) 25000 UT/250ML-% IV SOLN
1850.0000 [IU]/h | INTRAVENOUS | Status: DC
  Administered 2021-07-24: 1500 [IU]/h via INTRAVENOUS
  Administered 2021-07-24: 1200 [IU]/h via INTRAVENOUS
  Administered 2021-07-25: 1850 [IU]/h via INTRAVENOUS
  Filled 2021-07-24 (×3): qty 250

## 2021-07-24 MED ORDER — OXYCODONE HCL 5 MG PO TABS
20.0000 mg | ORAL_TABLET | ORAL | Status: DC | PRN
  Administered 2021-07-24 (×2): 20 mg via ORAL
  Filled 2021-07-24 (×2): qty 4

## 2021-07-24 MED ORDER — ONDANSETRON HCL 4 MG/2ML IJ SOLN: 4.0000 mg | Freq: Four times a day (QID) | INTRAMUSCULAR | Status: DC | PRN

## 2021-07-24 MED ORDER — ACETAMINOPHEN 325 MG PO TABS: 650.0000 mg | ORAL_TABLET | ORAL | Status: DC | PRN

## 2021-07-24 MED ORDER — ATORVASTATIN CALCIUM 20 MG PO TABS
10.0000 mg | ORAL_TABLET | Freq: Every day | ORAL | Status: DC
  Administered 2021-07-24: 10 mg via ORAL
  Filled 2021-07-24: qty 1

## 2021-07-24 MED ORDER — HEPARIN (PORCINE) 25000 UT/250ML-% IV SOLN: 14.0000 [IU]/kg/h | INTRAVENOUS | Status: DC

## 2021-07-24 MED ORDER — HEPARIN SODIUM (PORCINE) 5000 UNIT/ML IJ SOLN: 4000.0000 [IU] | Freq: Once | INTRAMUSCULAR | Status: DC

## 2021-07-24 MED ORDER — HEPARIN BOLUS VIA INFUSION
4000.0000 [IU] | Freq: Once | INTRAVENOUS | Status: AC
  Administered 2021-07-24: 4000 [IU] via INTRAVENOUS
  Filled 2021-07-24: qty 4000

## 2021-07-24 MED ORDER — NITROGLYCERIN 0.4 MG SL SUBL: 0.4000 mg | SUBLINGUAL_TABLET | SUBLINGUAL | Status: DC | PRN

## 2021-07-24 MED ORDER — INSULIN GLARGINE-YFGN 100 UNIT/ML ~~LOC~~ SOLN
15.0000 [IU] | Freq: Every day | SUBCUTANEOUS | Status: DC
  Administered 2021-07-24: 15 [IU] via SUBCUTANEOUS
  Filled 2021-07-24 (×2): qty 0.15

## 2021-07-24 MED ORDER — INSULIN ASPART 100 UNIT/ML IJ SOLN
0.0000 [IU] | INTRAMUSCULAR | Status: DC
  Administered 2021-07-24 (×2): 4 [IU] via SUBCUTANEOUS
  Administered 2021-07-24: 15 [IU] via SUBCUTANEOUS
  Administered 2021-07-24 (×2): 4 [IU] via SUBCUTANEOUS
  Administered 2021-07-25: 3 [IU] via SUBCUTANEOUS
  Administered 2021-07-25: 4 [IU] via SUBCUTANEOUS
  Administered 2021-07-25: 3 [IU] via SUBCUTANEOUS
  Filled 2021-07-24 (×8): qty 1

## 2021-07-24 NOTE — Progress Notes (Signed)
Dr. Pietro Cassis made aware that pt's last troponin is 1,611. Pt is on heparin gtt, no complaints of chest pain.

## 2021-07-24 NOTE — ED Notes (Signed)
Dr. Beather Arbour notified of Critical lab value: Troponin 708

## 2021-07-24 NOTE — Assessment & Plan Note (Signed)
Followed at [facility] Had MRI liver on 07/23/2021

## 2021-07-24 NOTE — Consult Note (Signed)
Cardiology Consult    Patient ID: Michael Mack MRN: [id number], DOB/AGE: 1960/08/17   Admit date: 07/23/2021 Date of Consult: 07/24/2021  Primary Physician: Tawni Millers, MD Primary Cardiologist: Ida Rogue, MD Requesting Provider: B. Dahal, MD  Patient Profile    Michael Mack is a 61 y.o. male with a history of obesity, HTN, HL, DMII, hepatocellular carcinoma, cirrhosis, hepatic encephalopathy, chronic pain, Hep C, headaches, and anxiety who is being seen today for the evaluation of elevated hsTrop in the setting of hypoxia/fall at the request of Dr. Pietro Cassis.  Past Medical History   Past Medical History:  Diagnosis Date   Bulging lumbar disc    Chronic pain    Diabetes mellitus without complication (HCC)    GERD (gastroesophageal reflux disease)    Hepatic encephalopathy    Hepatitis C    Hepatocellular carcinoma (HCC)    Hypertension    Migraines    Morbid obesity ([place])    Thrombocytopenia ([place])     Past Surgical History:  Procedure Laterality Date   KNEE SURGERY     Right Knee x 3, Left Knee x 1     Allergies  No Known Allergies  History of Present Illness    61 y.o. male with a history of obesity, HTN, HL, DMII, hepatocellular carcinoma, cirrhosis, hepatic encephalopathy, chronic pain, Hep C, headaches, and anxiety.  He has no prior cardiac history.  In 11/2019, he was admitted w/ resp failure req intubation in the setting of drug overdose and possible aspiration.  An echo done during that admission showed mild LV dysfxn, w/ an EF of 45-50%, glob HK, and w/o any significant valvular dzs.  Pt lives locally w/ his wife.  He is followed @ [facility] for hepatic cirrhosis.  He does not routinely exercise but denies any prior h/o chest pain or DOE.  On 2/1, he was seen @ [facility] radiology for MRI.  He was noted to be breathing heavily (wife told staff that she felt he was breathing heavier than usual), and O2 sat was 89-90%.  After discussion w/ radiologist on site, and he was  advised to f/u w/ his PCP.  Upon returning home, he walked out into his yard to do something, and as he came upon an area near a small pond, the ground was very muddy.  He lost his footing and fell forward into the mud.  He tried to get up, but due to the mud, he wasn't able to get his footing or obtain a good hand-hold, and he struggled to get up, though he was able to work himself onto his back.  After about 20 mins of trying to get up, his wife found him lying in the mud.  His wife called EMS b/c he reportedly seemed altered and dyspneic.  He was found to be hypoxic and tachycardic, and was placed on [place] @ 6lpm.  He was taken to the [facility] ED, where ECG showed sinus tachycardia @ 108 w/ RBBB.  He was placed on BiPAP on arrival (ABG w/ pH 7.3 and PCO2 of 62).  BNP 29.  Ammonia 107.  HsTrop 58  700  1611.  CTA of the chest was neg for PE.  CT abd/pelvis w/ portal hypertension, multiple splenic varices and spontaneous L splenorenal shunt.  Head CT w/o acute intracranial abnormality.  In light of elevated hsTrop, Mr. Reffett has been on heparin overnight.  He denies c/p or dyspnea prior to his fall, and also notes that he  never lost consciousness on 2/1.  He remains on BiPAP this AM.  Inpatient Medications     [START ON 07/25/2021] aspirin EC  81 mg Oral Daily   insulin aspart  0-20 Units Subcutaneous Q4H   lactulose  20 g Oral TID   rifaximin  550 mg Oral BID    Family History    Family History  Problem Relation Age of Onset   COPD Mother    Cancer Mother    Heart attack Father 33   He indicated that his mother is deceased. He indicated that his father is deceased.   Social History    Social History   Socioeconomic History   Marital status: Married    Spouse name: Michael Mack   Number of children: 0   Years of education: Not on file   Highest education level: 8th grade  Occupational History   Occupation: unemployed  Tobacco Use   Smoking status: Never   Smokeless tobacco: Never   Vaping Use   Vaping Use: Never used  Substance and Sexual Activity   Alcohol use: No   Drug use: No   Sexual activity: Yes  Other Topics Concern   Not on file  Social History Narrative   Dental problems- Already went to [place] [facility] program that we referred him too. Visited [facility] program but was too expensive.   Social Determinants of Health   Financial Resource Strain: Not on file  Food Insecurity: Not on file  Transportation Needs: Not on file  Physical Activity: Not on file  Stress: Not on file  Social Connections: Not on file  Intimate Partner Violence: Not on file     Review of Systems    General:  No chills, fever, night sweats or weight changes.  Cardiovascular:  No chest pain, +++ dyspnea on exertion, no edema, orthopnea, palpitations, paroxysmal nocturnal dyspnea. Dermatological: No rash, lesions/masses Respiratory: No cough, +++ dyspnea Urologic: No hematuria, dysuria Abdominal:   No nausea, vomiting, diarrhea, bright red blood per rectum, melena, or hematemesis Neurologic:  No visual changes, wkns, changes in mental status. All other systems reviewed and are otherwise negative except as noted above.  Physical Exam    Blood pressure (!) 127/59, pulse 77, temperature 97.9 F (36.6 C), temperature source Axillary, resp. rate 16, height 5\' 5"  (1.651 m), weight 113.4 kg, SpO2 93 %.  General: Pleasant, NAD Psych: Normal affect. Neuro: Alert and oriented X 3. Moves all extremities spontaneously. HEENT: Normal  Neck: Supple without bruits.  Difficult to gauge JVP 2/2 girth. Lungs:  Resp regular and unlabored, diminished breath sounds bilat. Heart: RRR, distant, no s3, s4, or murmurs. Abdomen: Obese, soft, non-tender, non-distended, BS + x 4.  Extremities: No clubbing, cyanosis.  Trace to 1+ bilat LE edema. DP/PT2+, Radials 2+ and equal bilaterally.  Labs    Cardiac Enzymes Recent Labs  Lab 07/23/21 2144 07/24/21 0048 07/24/21 0759  TROPONINIHS 58*  708* 1,611*      Lab Results  Component Value Date   WBC 5.8 07/24/2021   HGB 12.5 (L) 07/24/2021   HCT 36.6 (L) 07/24/2021   MCV 91.0 07/24/2021   PLT 86 (L) 07/24/2021    Recent Labs  Lab 07/23/21 2144 07/24/21 0759  NA 140 140  K 3.8 4.0  CL 103 107  CO2 27 27  BUN 19 24*  CREATININE 1.00 0.97  CALCIUM 9.2 8.9  PROT 7.8  --   BILITOT 0.7  --   ALKPHOS 61  --  ALT 20  --   AST 39  --   GLUCOSE 218* 197*   Lab Results  Component Value Date   CHOL 129 07/24/2021   HDL 43 07/24/2021   LDLCALC 73 07/24/2021   TRIG 64 07/24/2021   Lab Results  Component Value Date   INR 1.2 07/24/2021   INR CANCELED 06/27/2020   INR 1.3 (H) 12/02/2019      Radiology Studies    CT HEAD WO CONTRAST (5MM)  Result Date: 07/24/2021 CLINICAL DATA:  Altered mental status EXAM: CT HEAD WITHOUT CONTRAST TECHNIQUE: Contiguous axial images were obtained from the base of the skull through the vertex without intravenous contrast. RADIATION DOSE REDUCTION: This exam was performed according to the departmental dose-optimization program which includes automated exposure control, adjustment of the mA and/or kV according to patient size and/or use of iterative reconstruction technique. COMPARISON:  CT head dated 12/02/2019 FINDINGS: Brain: No evidence of acute infarction, hemorrhage, hydrocephalus, extra-axial collection or mass lesion/mass effect. Mild subcortical white matter and periventricular small vessel ischemic changes. Vascular: Mild intracranial atherosclerosis. Skull: Normal. Negative for fracture or focal lesion. Sinuses/Orbits: Mild partial opacification of the right maxillary sinus. Visualized paranasal sinuses and mastoid air cells are otherwise clear. Other: None. IMPRESSION: No evidence of acute intracranial abnormality. Mild small vessel ischemic changes. Electronically Signed   By: Julian Hy M.D.   On: 07/24/2021 00:21   CT Angio Chest PE W and/or Wo Contrast  Result Date:  07/24/2021 CLINICAL DATA:  Found face down in pond with shortness of breath, initial encounter EXAM: CT ANGIOGRAPHY CHEST CT ABDOMEN AND PELVIS WITH CONTRAST TECHNIQUE: Multidetector CT imaging of the chest was performed using the standard protocol during bolus administration of intravenous contrast. Multiplanar CT image reconstructions and MIPs were obtained to evaluate the vascular anatomy. Multidetector CT imaging of the abdomen and pelvis was performed using the standard protocol during bolus administration of intravenous contrast. RADIATION DOSE REDUCTION: This exam was performed according to the departmental dose-optimization program which includes automated exposure control, adjustment of the mA and/or kV according to patient size and/or use of iterative reconstruction technique. CONTRAST:  163mL OMNIPAQUE IOHEXOL 350 MG/ML SOLN COMPARISON:: COMPARISON: Chest x-ray from earlier in the same day, CT from 11/12/2009. FINDINGS: CTA CHEST FINDINGS Cardiovascular: Thoracic aorta shows atherosclerotic calcifications without aneurysmal dilatation or dissection. No cardiac enlargement is noted. No pericardial effusion is seen. No coronary calcifications are noted. The pulmonary artery shows a normal branching pattern without definitive filling defect to suggest pulmonary embolism. Mediastinum/Nodes: Thoracic inlet is within normal limits. No sizable hilar or mediastinal adenopathy is noted. The esophagus is within normal limits. Lungs/Pleura: Bibasilar atelectatic changes are noted slightly worse on the right than the left. No sizable effusion is seen. Musculoskeletal: Mild degenerative change of the thoracic spine is noted. No rib abnormality is noted. Review of the MIP images confirms the above findings. CT ABDOMEN and PELVIS FINDINGS Hepatobiliary: Liver demonstrates linear metallic densities throughout the right lobe consistent with prior fiducial marking. A vague area of decreased attenuation is noted in the dome  of the liver just beneath the diaphragm but incompletely evaluated on this exam. Cirrhotic changes of liver are noted. The gallbladder is within normal limits. Pancreas: Unremarkable. No pancreatic ductal dilatation or surrounding inflammatory changes. Spleen: Spleen is prominent. Multiple splenic varices are identified increased in size when compared with the prior exam from 2011. 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 No significant esophageal or gastric varices are seen. Adrenals/Urinary Tract:  Adrenal glands are unremarkable. Kidneys demonstrate a normal enhancement pattern. No renal calculi or obstructive changes are noted. Normal excretion is noted on delayed images. The bladder is well distended. Stomach/Bowel: Mild diverticular change of the colon is noted without evidence of diverticulitis. No obstructive or inflammatory changes are seen. The appendix is within normal limits. No small bowel abnormality is noted. Stomach is within normal limits. Vascular/Lymphatic: Aortic atherosclerosis. Periportal and gastrohepatic lymph nodes are noted. The largest of these is seen on image number 29 of series 3 measuring up to 13 mm in short axis. No other significant lymphadenopathy is noted. Reproductive: Prostate is unremarkable. Other: No abdominal wall hernia or abnormality. No abdominopelvic ascites. Musculoskeletal: No acute or significant osseous findings. Review of the MIP images confirms the above findings. IMPRESSION: CTA of the chest: No evidence of pulmonary emboli. Mild bibasilar atelectasis right greater than left. 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 A vague area of decreased attenuation is noted near the dome of the liver which may be related to the prior ablation treatment. This is incompletely evaluated on this exam. Diverticulosis without diverticulitis. Changes consistent with portal hypertension with multiple splenic varices and spontaneous  left splenorenal shunt. Stable appearing periportal and gastrohepatic lymph nodes when compared with report from an MRI of 07/23/2021 Electronically Signed   By: Inez Catalina M.D.   On: 07/24/2021 00:24   CT ABDOMEN PELVIS W CONTRAST  Result Date: 07/24/2021 CLINICAL DATA:  Found face down in pond with shortness of breath, initial encounter EXAM: CT ANGIOGRAPHY CHEST CT ABDOMEN AND PELVIS WITH CONTRAST TECHNIQUE: Multidetector CT imaging of the chest was performed using the standard protocol during bolus administration of intravenous contrast. Multiplanar CT image reconstructions and MIPs were obtained to evaluate the vascular anatomy. Multidetector CT imaging of the abdomen and pelvis was performed using the standard protocol during bolus administration of intravenous contrast. RADIATION DOSE REDUCTION: This exam was performed according to the departmental dose-optimization program which includes automated exposure control, adjustment of the mA and/or kV according to patient size and/or use of iterative reconstruction technique. CONTRAST:  181mL OMNIPAQUE IOHEXOL 350 MG/ML SOLN COMPARISON:: COMPARISON: Chest x-ray from earlier in the same day, CT from 11/12/2009. FINDINGS: CTA CHEST FINDINGS Cardiovascular: Thoracic aorta shows atherosclerotic calcifications without aneurysmal dilatation or dissection. No cardiac enlargement is noted. No pericardial effusion is seen. No coronary calcifications are noted. The pulmonary artery shows a normal branching pattern without definitive filling defect to suggest pulmonary embolism. Mediastinum/Nodes: Thoracic inlet is within normal limits. No sizable hilar or mediastinal adenopathy is noted. The esophagus is within normal limits. Lungs/Pleura: Bibasilar atelectatic changes are noted slightly worse on the right than the left. No sizable effusion is seen. Musculoskeletal: Mild degenerative change of the thoracic spine is noted. No rib abnormality is noted. Review of the MIP  images confirms the above findings. CT ABDOMEN and PELVIS FINDINGS Hepatobiliary: Liver demonstrates linear metallic densities throughout the right lobe consistent with prior fiducial marking. A vague area of decreased attenuation is noted in the dome of the liver just beneath the diaphragm but incompletely evaluated on this exam. Cirrhotic changes of liver are noted. The gallbladder is within normal limits. Pancreas: Unremarkable. No pancreatic ductal dilatation or surrounding inflammatory changes. Spleen: Spleen is prominent. Multiple splenic varices are identified increased in size when compared with the prior exam from 2011. 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 No significant esophageal or gastric varices are seen. Adrenals/Urinary Tract:  Adrenal glands are unremarkable. Kidneys demonstrate a normal enhancement pattern. No renal calculi or obstructive changes are noted. Normal excretion is noted on delayed images. The bladder is well distended. Stomach/Bowel: Mild diverticular change of the colon is noted without evidence of diverticulitis. No obstructive or inflammatory changes are seen. The appendix is within normal limits. No small bowel abnormality is noted. Stomach is within normal limits. Vascular/Lymphatic: Aortic atherosclerosis. Periportal and gastrohepatic lymph nodes are noted. The largest of these is seen on image number 29 of series 3 measuring up to 13 mm in short axis. No other significant lymphadenopathy is noted. Reproductive: Prostate is unremarkable. Other: No abdominal wall hernia or abnormality. No abdominopelvic ascites. Musculoskeletal: No acute or significant osseous findings. Review of the MIP images confirms the above findings. IMPRESSION: CTA of the chest: No evidence of pulmonary emboli. Mild bibasilar atelectasis right greater than left. 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 A vague area of decreased attenuation is noted near  the dome of the liver which may be related to the prior ablation treatment. This is incompletely evaluated on this exam. Diverticulosis without diverticulitis. Changes consistent with portal hypertension with multiple splenic varices and spontaneous left splenorenal shunt. Stable appearing periportal and gastrohepatic lymph nodes when compared with report from an MRI of 07/23/2021 Electronically Signed   By: Inez Catalina M.D.   On: 07/24/2021 00:24   DG Chest Portable 1 View  Result Date: 07/23/2021 CLINICAL DATA:  Shortness of breath EXAM: PORTABLE CHEST 1 VIEW COMPARISON:  12/03/2019 FINDINGS: Eventration of the right hemidiaphragm. Mild right basilar atelectasis. Increased tissue markings without frank interstitial edema. No pleural effusion or pneumothorax. The heart is normal in size.  Thoracic aortic atherosclerosis. IMPRESSION: Eventration of the right hemidiaphragm with mild right basilar atelectasis. Electronically Signed   By: Julian Hy M.D.   On: 07/23/2021 22:25    ECG & Cardiac Imaging    2/1 - sinus tachycardia, 109, RBBB - personally reviewed. 2/2 - RSR, 88, RBBB - personally reviewed.  Assessment & Plan    1.  Acute hypoxic respiratory failure:  Pt presented 2/1 after being noted to be more short of breath by wife and radiology staff earlier in the day, and subsequently found to be hypoxic following fall in the mud, after which, he struggled and then was ultimately unable to get up b/c of the mud.  CTA chest neg for PE.  Mild R>L bibasilar atx noted.  BNP nl @ 29 (but likely less accurate in setting of obesity).  Has worn BiPAP overnight and currently saturating in the mid-90's. Exam challenging due to body habitus, though he does have mild lower ext edema. Prior h/o resp failure w/ admission in 2021 in the setting of drug overdose (on oxycodone and methadone for chronic pain) w/ EF of 45-50% on echo @ that time.  Echo pending.  Will give a dose of lasix this AM.  2.  Demand  Ischemia vs NSTEMI:  Pt w/o prior h/o CAD.  In setting of above, HsTrop has elevated from 58  700  1611.  He denies any current or prior h/o chest pain.  Dyspnea/hypoxia as above - ? Anginal equivalent.  Cont heparin x 48 hrs.  Await echo.  Will require ischemic eval, possibly cath, depending on degree of LV dysfxn.  With h/o thrombocytopenia and liver dzs, would prefer to avoid cath/antiplatelet therapy, though plts relatively stable on heparin currently.   Will add low-dose ?  blocker and statin (lfts ok).   3.  Essential HTN:  stable.  4.  AMS:  H & P indicates that Pts wife noted AMS upon finding him on the ground.  Ammonia 107  Lactulose ordered.  Currently lucid/appropriate.  5. DMII:  per IM.  6.  Chronic pain:  oxycodone and methadone listed on home med list.  ? Potential role for recurrent overdose leading to hypoxia.  7.  HCC/Hepatic cirrhosis:  followed @ [facility] w/ MRI 2/1   No definite new findings.  Signed, Murray Hodgkins, NP 07/24/2021, 10:16 AM  For questions or updates, please contact   Please consult [email or web address] for contact info under Cardiology/STEMI.

## 2021-07-24 NOTE — Assessment & Plan Note (Signed)
Patient received a dose of lactulose in the ED Continue to monitor

## 2021-07-24 NOTE — Progress Notes (Signed)
MD Dahal made aware that pt's hemoglobin dropped from 15 to 12.5 g/dl. Pt is on heparin gtt. No new orders received.

## 2021-07-24 NOTE — Assessment & Plan Note (Addendum)
Not decompensated.  Has mild thrombocytopenia Splenic varices seen on CT abdomen and pelvics

## 2021-07-24 NOTE — Assessment & Plan Note (Addendum)
Etiology likely multifactorial, including NSTEMI, hepatic encephalopathy, opioid/benzodiazepine use We will keep n.p.o. for now Fall and aspiration precautions Will order UDS Treat acute medical problems outlined below

## 2021-07-24 NOTE — Assessment & Plan Note (Signed)
None opioid pain meds if possible and if not, judicious opioid use for pain control

## 2021-07-24 NOTE — Assessment & Plan Note (Signed)
Complicating factor to overall prognosis and care 

## 2021-07-24 NOTE — Progress Notes (Signed)
ANTICOAGULATION CONSULT NOTE - Initial Consult  Pharmacy Consult for Heparin  Indication: ACS/NSTEMI   No Known Allergies  Patient Measurements: Height: 5\' 5"  (165.1 cm) Weight: 113.4 kg (250 lb) IBW/kg (Calculated) : 61.5 Heparin Dosing Weight: 87.8 kg   Vital Signs: Temp: 97.9 F (36.6 C) (02/02 0900) Temp Source: Axillary (02/02 0900) BP: 127/59 (02/02 0900) Pulse Rate: 77 (02/02 0900)  Labs: Recent Labs    07/23/21 2144 07/24/21 0048 07/24/21 0157 07/24/21 0759  HGB 15.0  --   --  12.5*  HCT 44.3  --   --  36.6*  PLT 104*  --   --  86*  APTT  --   --  29  --   LABPROT  --   --  15.2  --   INR  --   --  1.2  --   HEPARINUNFRC  --   --   --  0.14*  CREATININE 1.00  --   --  0.97  TROPONINIHS 58* 708*  --  1,611*     Estimated Creatinine Clearance: 94.3 mL/min (by C-G formula based on SCr of 0.97 mg/dL).   Medical History: Past Medical History:  Diagnosis Date   Bulging lumbar disc    Chronic pain    Diabetes mellitus without complication (HCC)    GERD (gastroesophageal reflux disease)    Hepatic encephalopathy    Hepatitis C    Hepatocellular carcinoma (HCC)    Hypertension    Migraines    Morbid obesity ([place])    Thrombocytopenia ([place])     Medications:  (Not in a hospital admission)  Assessment: Pharmacy consulted to dose heparin in this 61 year old male admitted with ACS/NSTEMI.  No prior anticoag noted.   CrCl = 91.4 ml/min   Date Time HL Rate/comment 2/2 0759 0.14 1200 un/hr   Goal of Therapy:  Heparin level 0.3-0.7 units/ml Monitor platelets by anticoagulation protocol: Yes   Plan:  Heparin level 0.14, subtherapeutic Bolus heparin 2600 units x 1 and increase heparin infusion to 1500 units/hr Check anti-Xa level in 6 hours and daily while on heparin Continue to monitor H&H and platelets  Darnelle Bos, PharmD  07/24/2021,9:28 AM

## 2021-07-24 NOTE — Assessment & Plan Note (Signed)
Hold oxycodone and methadone until more alert

## 2021-07-24 NOTE — Assessment & Plan Note (Signed)
Sliding scale insulin coverage 

## 2021-07-24 NOTE — Assessment & Plan Note (Signed)
Hydralazine as needed for blood pressure control while n.p.o.

## 2021-07-24 NOTE — ED Notes (Signed)
Pt transitioned to a hospital bed to promote comfort.  °

## 2021-07-24 NOTE — Progress Notes (Signed)
ANTICOAGULATION CONSULT NOTE  Pharmacy Consult for Heparin  Indication: ACS/NSTEMI   No Known Allergies  Patient Measurements: Height: 5\' 5"  (165.1 cm) Weight: 113.4 kg (250 lb) IBW/kg (Calculated) : 61.5 Heparin Dosing Weight: 87.8 kg   Vital Signs: Temp: 98.8 F (37.1 C) (02/02 1358) Temp Source: Oral (02/02 1358) BP: 132/69 (02/02 1400) Pulse Rate: 93 (02/02 1400)  Labs: Recent Labs    07/23/21 2144 07/24/21 0048 07/24/21 0157 07/24/21 0759  HGB 15.0  --   --  12.5*  HCT 44.3  --   --  36.6*  PLT 104*  --   --  86*  APTT  --   --  29  --   LABPROT  --   --  15.2  --   INR  --   --  1.2  --   HEPARINUNFRC  --   --   --  0.14*  CREATININE 1.00  --   --  0.97  TROPONINIHS 58* 708*  --  1,611*     Estimated Creatinine Clearance: 94.3 mL/min (by C-G formula based on SCr of 0.97 mg/dL).   Medical History: Past Medical History:  Diagnosis Date   Bulging lumbar disc    Chronic pain    Diabetes mellitus without complication (HCC)    GERD (gastroesophageal reflux disease)    Hepatic encephalopathy    Hepatitis C    Hepatocellular carcinoma (HCC)    Hypertension    Migraines    Morbid obesity ([place])    Thrombocytopenia ([place])     Assessment: Pharmacy consulted to dose heparin in this 61 year old male w/ PMH of obesity, HTN, HL, DMII, hepatocellular carcinoma, cirrhosis, hepatic encephalopathy, chronic pain, Hep C, headaches, and anxiety admitted with ACS/NSTEMI.  No prior anticoag noted.  H&H, platelets (low at baseline) trending down  HsTrop has elevated from 58  700  1611  Goal of Therapy:  Heparin level 0.3-0.7 units/ml Monitor platelets by anticoagulation protocol: Yes   Plan:  heparin level remains subtherapeutic despite previous rate increase Bolus heparin 2600 units x 1 and increase heparin infusion to 1850 units/hr recheck anti-Xa level in 6 hours after rate change  Continue to monitor H&H and platelets  Dallie Piles, PharmD  07/24/2021,3:22  PM

## 2021-07-24 NOTE — H&P (Signed)
History and Physical    Patient: Michael Mack VXY:[id number] DOB: [birth year] DOA: 07/23/2021 DOS: the patient was seen and examined on 07/24/2021 PCP: Tawni Millers, MD  Patient coming from: Home  Chief Complaint:  Chief Complaint  Patient presents with   Fall    HPI: Michael Mack is a 61 y.o. male with medical history significant of Cirrhosis, hepatocellular carcinoma, chronic pain on chronic opioids, DM, HTN, with no history of CHF, CAD or COPD, hospitalized in June 2021 with respiratory failure requiring mechanical ventilation x2 days believed secondary to unintentional overdose of anxiolytics and opioids,  who was brought to the ED following a fall.  Patient had an MRI earlier in the day for which she is usually sedated but was not this time due to hypoxia.  On his return home he went outside to get something and after 20 minutes of not returning in the house he was found on the ground beside a pond on the property, partially submerged, face up and but appeared to have difficulty breathing.  EMS reported him as being altered and hypoxic.  He was placed on 6 L nasal cannula. Per EDP, Patient recalls the events stating he slipped in the mud and fell.  He denied chest pain.  History limited due to lethargy, shortness of breath patient not on BiPAP.  ED course: On arrival tachypneic at 28 with O2 sat 95% on 6 L, BP 133/102 and heart rate 107.  Afebrile  Blood work: WBC 8300 and hemoglobin 15 with platelet count of 104,000 Venous blood gas with pH 7.3, PCO2 62 CMP unremarkable except for glucose of 218 Troponin 58 and BNP 29 Ammonia 107  EKG, personally reviewed and interpreted: Sinus tachycardia at 109 with no acute ST-T wave changes  Imaging: CTA chest PE protocol with no evidence of PE, mild bibasilar atelectasis right greater than left CT abdomen and pelvis: Changes consistent with portal hypertension, multiple splenic varices and spontaneous left splenorenal shunt.  Stable  appearance compared with MRI of 07/23/2021 6 CT head no acute intracranial abnormality  Patient was treated with DuoNebs x3, given a dose of lactulose.  He was placed on BiPAP.  Hospitalist consulted for admission.   Review of Systems: As mentioned in the history of present illness. All other systems reviewed and are negative. Past Medical History:  Diagnosis Date   Bulging lumbar disc    Diabetes mellitus without complication (HCC)    GERD (gastroesophageal reflux disease)    Hepatic encephalopathy    Hepatitis C    Hypertension    Migraines    Past Surgical History:  Procedure Laterality Date   KNEE SURGERY     Right Knee x 3, Left Knee x 1   Social History:  reports that he has never smoked. He has never used smokeless tobacco. He reports that he does not drink alcohol and does not use drugs.  No Known Allergies  Family History  Problem Relation Age of Onset   COPD Mother    Cancer Mother    Heart attack Father 17    Prior to Admission medications   Medication Sig Start Date End Date Taking? Authorizing Provider  acetaminophen (TYLENOL) 500 MG tablet Take 500 mg by mouth as needed for headache.   Yes [provider]  amitriptyline (ELAVIL) 50 MG tablet 100 mg at bedtime.   Yes [provider]  Cholecalciferol 25 MCG (1000 UT) tablet Take 1,000 Units by mouth daily.   Yes [provider]  hydrochlorothiazide (HYDRODIURIL) 25 MG tablet TAKE ONE TABLET BY MOUTH EVERY DAY 05/14/21 05/14/22 Yes Iloabachie, Chioma E, NP  insulin lispro (HUMALOG KWIKPEN) 100 UNIT/ML KwikPen Inject 35 Units into the skin 3 (three) times daily with meals. 07/17/21  Yes Iloabachie, Chioma E, NP  Insulin Pen Needle 32G X 4 MM MISC Use as directed with Insulin 10/03/20  Yes Harrison, Keri K, RPH  lactulose (CHRONULAC) 10 GM/15ML solution TAKE 30ML BY MOUTH 3 TIMES DAILY. 06/24/21 06/24/22 Yes Iloabachie, Chioma E, NP  LANTUS SOLOSTAR 100 UNIT/ML Solostar Pen INJECT 50 UNITS UNDER  THE SKIN DAILY AT BEDTIME 06/26/21 06/26/22 Yes Iloabachie, Chioma E, NP  lisinopril (ZESTRIL) 40 MG tablet TAKE ONE TABLET BY MOUTH EVERY DAY 01/28/21 01/28/22 Yes Iloabachie, Chioma E, NP  methadone (DOLOPHINE) 10 MG tablet 30 mg every 8 (eight) hours as needed for moderate pain or severe pain.   Yes [provider]  multivitamin-iron-minerals-folic acid (CENTRUM) chewable tablet Chew 1 tablet by mouth daily.   Yes [provider]  naloxone (NARCAN) nasal spray 4 mg/0.1 mL Place into the nose. 05/26/19  Yes [provider]  Oxycodone HCl 20 MG TABS Take 1 tablet by mouth every 4 (four) hours as needed. Prescribed by pain management 06/12/21  Yes [provider]  XIFAXAN 550 MG TABS tablet Take 1 tablet (550 mg total) by mouth 2 (two) times daily. 09/23/20  Yes Payton Emerald, FNP  amphetamine-dextroamphetamine (ADDERALL) 20 MG tablet Take 20 mg by mouth daily. 07/11/21   [provider]    Physical Exam: Vitals:   07/23/21 2308 07/24/21 0000 07/24/21 0045 07/24/21 0104  BP: (!) 163/93 (!) 167/90 (!) 156/60 (!) 142/64  Pulse: 93 98 92 95  Resp: 16 (!) 21 17 13   Temp:      TempSrc:      SpO2: 100% 97% 97% 95%  Weight: 113.4 kg     Height: 5\' 5"  (1.651 m)       Physical Exam Vitals and nursing note reviewed.  Constitutional:      Appearance: He is obese.  HENT:     Head: Normocephalic and atraumatic.  Eyes:     Extraocular Movements: Extraocular movements intact.     Pupils: Pupils are equal, round, and reactive to light.  Cardiovascular:     Rate and Rhythm: Normal rate and regular rhythm.  Pulmonary:     Effort: No respiratory distress.     Comments: Dimished and on Bipap  Abdominal:     General: There is no distension.     Tenderness: There is no abdominal tenderness. There is no guarding.  Musculoskeletal:        General: Normal range of motion.  Skin:    General: Skin is warm and dry.  Neurological:     General: No focal deficit  present.     Mental Status: He is easily aroused. He is lethargic.     Data Reviewed:   Past discharge summary, ED providers note, triage note Labs, EKG, imaging Notes and imaging for comparison on Care Everywhere Assessment and Plan: * Acute metabolic encephalopathy- (present on admission) Etiology likely multifactorial, including NSTEMI, hepatic encephalopathy, opioid/benzodiazepine use We will keep n.p.o. for now Fall and aspiration precautions Will order UDS Treat acute medical problems outlined below  NSTEMI (non-ST elevated myocardial infarction) ([place]) Troponin 58>700 but without complaints of chest pain or acute EKG changes Will treat with heparin infusion, Aspirin Echo to evaluate for wall motion abnormality Cardiology consulted  Monitor for bleeding in view of thrombocytopenia, known splenic varices.  Acute respiratory failure with hypoxia and hypercapnia (HCC) Patient was hypoxic with EMS requiring 6 L O2 to maintain sats in the mid 90s Etiology concerning for possible medication especially in view of past history Denies history of COPD.  May have underlying OSA History of mechanical ventilation for unintentional overdose of meds in therapeutic use CTA negative for PE or other acute finding Continue BiPAP and wean as tolerated   Chronic, continuous use of opioids Hold oxycodone and methadone until more alert  Increased ammonia level Patient received a dose of lactulose in the ED Continue to monitor  Hepatocellular carcinoma [facility])- (present on admission) Followed at [facility] Had MRI liver on 07/23/2021  Chronic pain- (present on admission) None opioid pain meds if possible and if not, judicious opioid use for pain control  Cirrhosis ([place])- (present on admission) Not decompensated.  Has mild thrombocytopenia Splenic varices seen on CT abdomen and pelvics  Hypertension- (present on admission) Hydralazine as needed for blood pressure control while  n.p.o.  Diabetes (HCC) Sliding scale insulin coverage  Obesity, Class III, BMI 40-49.9 (morbid obesity) ([place]) Complicating factor to overall prognosis and care    Advance Care Planning:   Code Status: Prior   Consults: cardiology  Family Communication: none  Severity of Illness: The appropriate patient status for this patient is INPATIENT. Inpatient status is judged to be reasonable and necessary in order to provide the required intensity of service to ensure the patient's safety. The patient's presenting symptoms, physical exam findings, and initial radiographic and laboratory data in the context of their chronic comorbidities is felt to place them at high risk for further clinical deterioration. Furthermore, it is not anticipated that the patient will be medically stable for discharge from the hospital within 2 midnights of admission.   * I certify that at the point of admission it is my clinical judgment that the patient will require inpatient hospital care spanning beyond 2 midnights from the point of admission due to high intensity of service, high risk for further deterioration and high frequency of surveillance required.*  Author: Athena Masse, MD 07/24/2021 1:40 AM  For on call review [email or web address].

## 2021-07-24 NOTE — Progress Notes (Addendum)
PROGRESS NOTE  Michael Mack  DOB: Apr 26, 1961  PCP: Tawni Millers, MD FIE:[id number]  DOA: 07/23/2021  LOS: 0 days  Hospital Day: 2  Chief Complaint  Patient presents with   Fall    Brief narrative: Michael Mack is a 61 y.o. male with PMH significant for morbid obesity, DM2, HTN, hepatitis C, liver cirrhosis, hepatorenal carcinoma, chronic pain on opioids, last hospitalized in June 2021 with respiratory failure requiring mechanical ventilation x2 days believed secondary to unintentional overdose of anxiolytics and opioids. Patient was brought to the ED on 07/23/2021 after a fall. Patient had an abdominal MRI earlier in the day for which he is usually sedated but was not this time due to hypoxia.  On his return home he went outside to get something and after 20 minutes of not returning in the house, he was found on the ground beside a pond on the property, partially submerged, face up, awake but had trouble breathing.   EMS noted him altered and hypoxic, started him on [employer] and brought to the ED.   In the ED, patient was able to recall the event, stating he slipped in the mud and fell and was unable to get himself up. He was afebrile, heart rate 107, respiratory 28, blood pressure 133/102, required 6 L by nasal cannula to maintain saturation over 90% Venous blood gas with pH 7.3, PCO2 62, PO2 54, HCO3 31 CMP showed glucose elevated at 218 and ammonia level elevated to 107 Initial troponin elevated 58 CBC unremarkable except for platelet slightly low at 104 Respiratory virus panel negative EKG with sinus tachycardia at 109, no ST-T wave changes Chest x-ray showed eventration of the right hemidiaphragm with mild right basilar atelectasis. CT head did not show any acute intracranial abnormality CTA chest did not show any pulm embolism, showed mild bibasilar atelectasis right greater than left. CT of abdomen and pelvis did not show any acute intra-abdominal pathology, showed  chronic changes in the liver related to previous ablation treatment.  It showed changes consistent with portal hypertension with multiple splenic varices and a spontaneous left splenorenal shunt. Patient was treated with DuoNeb x3, given a dose of lactulose.  Placed on BiPAP  Troponin on subsequent check trended up to 1600 Cardiology was consulted, heparin drip was started Admitted to hospitalist service  Subjective: Patient was seen and examined this morning.  Pleasant middle-aged Caucasian male.  Morbidly obese.  Looks older than his stated age.  Not in distress. Was taken off BiPAP several months ago and breathing okay on room air.  Assessment/Plan: Acute metabolic encephalopathy -Multifactorial: Hyperammonemia, hypercapnia, opiate/benzodiazepine use. -Urine drug screen negative for nonprescription drugs -Mental status has improved this morning. -Continue fall and aspiration precautions  NSTEMI (non-ST elevated myocardial infarction) -Troponin 58>700>1600 but without complaints of chest pain or acute EKG changes -Cardiology was consulted.  Started on heparin drip and aspirin. -Echo to evaluate for wall motion abnormality -Further ischemic evaluation per cardiology.   Acute respiratory failure with hypoxia and hypercapnia -Venous blood gas with pH of 7.3, PCO2 62 and bicarb at 31.  Patient probably has compensated chronic respiratory acidosis but not requiring supplemental oxygen at home.   -Acute hypoxia could be related to acute metabolic encephalopathy.  Also has bilateral atelectasis.  Based on his morbid obesity, I would assume he may have underlying sleep apnea as well.  Sleep study recommended as an outpatient. -CTA negative for pulm embolism, pneumonia, pulm edema or pleural effusion -Was on BiPAP  this morning.  Breathing comfortably on room air as of now.  May need oxygen on ambulation.  Liver cirrhosis with portal hypertension Hyperammonemia -CT abdomen showed portal  hypertension with multiple splenic varices and a spontaneous left splenorenal shunt. -No evidence of ascites. -Ammonia level was elevated to 107. -Continued on lactulose and Xifaxan.  Repeat ammonia level tomorrow -Patient does not have florid hypervolemia but may benefit from low-dose of diuretics on a daily basis.  Lasix 20 mg one dose for now. d/w Dr. Rockey Situ, cardiology. Recent Labs  Lab 07/23/21 2144  AMMONIA 107*   Hepatocellular carcinoma History of hepatitis C -Follows up at [facility].  Had MRI liver on 07/23/2021.  Thrombocytopenia -Related to liver cirrhosis -Noted a drop in platelet count today.  Watch out for bleeding in the setting of heparin drip and aspirin. -Repeat blood work tomorrow Recent [employer] 07/23/21 2144 07/24/21 0759  PLT 104* 86*   Essential hypertension -Home meds include HCTZ 25 mg daily, lisinopril 40 mg daily -Keep him on hold for  Type 2 diabetes mellitus with hyperglycemia -A1c 7.6 on 06/25/2021 -Home meds include Lantus 15 units daily, insulin lispro 35 units 3 times daily with meals -Currently on sliding scale insulin with Accu-Cheks.  Since he has been started on diet this morning, I would resume Semglee this morning. Recent Labs  Lab 07/23/21 2143 07/24/21 0327 07/24/21 0804  GLUCAP 208* 197* 176*    Morbid obesity  -Body mass index is 41.6 kg/m. Patient has been advised to make an attempt to improve diet and exercise patterns to aid in weight loss.  Chronic pain on opioids History of unintentional overdose requiring mechanical ventilation -Home meds include oxycodone 20 mg every 4 hours as needed and methadone 30 mg 3 times daily as needed.  He states he gets it through a pain management specialist.  Continue the same plan.  Anxiety/depression -Home meds include amitriptyline 50 mg at bedtime, Adderall 20 mg daily  Mobility: Encourage ambulation. Living condition: Lives at home with wife Goals of care:   Code Status: Full Code   Nutritional status: Body mass index is 41.6 kg/m.      Diet:  Diet Order             Diet heart healthy/carb modified Room service appropriate? Yes; Fluid consistency: Thin  Diet effective now                  DVT prophylaxis: Heparin drip    Antimicrobials: None Fluid: None Consultants: Cardiology Family Communication: Family at bedside  Status is: Inpatient  Continue in-hospital care because: Ongoing cardiology evaluation Level of care: Stepdown   Dispo: The patient is from: Home              Anticipated d/c is to: Home hopefully in 1 to 2 days              Patient currently is not medically stable to d/c.   Difficult to place patient No     Infusions:   heparin 1,500 Units/hr (07/24/21 0957)    Scheduled Meds:  [START ON 07/25/2021] aspirin EC  81 mg Oral Daily   atorvastatin  10 mg Oral Daily   furosemide  40 mg Intravenous Once   insulin aspart  0-20 Units Subcutaneous Q4H   insulin glargine-yfgn  15 Units Subcutaneous Daily   lactulose  20 g Oral TID   metoprolol tartrate  12.5 mg Oral BID   rifaximin  550 mg Oral BID  PRN meds: acetaminophen, nitroGLYCERIN, ondansetron (ZOFRAN) IV   Antimicrobials: Anti-infectives (From admission, onward)    Start     Dose/Rate Route Frequency Ordered Stop   07/24/21 0800  rifaximin (XIFAXAN) tablet 550 mg        550 mg Oral 2 times daily 07/24/21 0215         Objective: Vitals:   07/24/21 0700 07/24/21 0900  BP: (!) 105/51 (!) 127/59  Pulse: 77 77  Resp: 15 16  Temp:  97.9 F (36.6 C)  SpO2: 94% 93%    Intake/Output Summary (Last 24 hours) at 07/24/2021 1057 Last data filed at 07/24/2021 0043 Gross per 24 hour  Intake --  Output 625 ml  Net -625 ml   Filed Weights   07/23/21 2308  Weight: 113.4 kg   Weight change:  Body mass index is 41.6 kg/m.   Physical Exam: General exam: Pleasant, middle-aged morbidly obese male.  Not in physical distress Skin: No rashes, lesions or  ulcers. HEENT: Atraumatic, normocephalic, no obvious bleeding Lungs: Diminished air entry in both bases. CVS: Regular rate and rhythm, no murmur GI/Abd soft, distended from obesity, nontender, bowel sound present CNS: Alert, awake, oriented x3 Psychiatry: Mood appropriate Extremities: Chronic stasis changes, trace bilateral pedal edema  Data Review: I have personally reviewed the laboratory data and studies available.  F/u labs ordered Unresulted Labs (From admission, onward)     Start     Ordered   07/25/21 0500  CBC with Differential/Platelet  Daily,   STAT     Question:  Specimen collection method  Answer:  Lab=Lab collect   07/24/21 0931   07/25/21 3419  Basic metabolic panel  Daily,   STAT     Question:  Specimen collection method  Answer:  Lab=Lab collect   07/24/21 0931   07/25/21 0500  Ammonia  Daily,   STAT     Question:  Specimen collection method  Answer:  Lab=Lab collect   07/24/21 0931   07/25/21 0500  Magnesium  Tomorrow morning,   STAT       Question:  Specimen collection method  Answer:  Lab=Lab collect   07/24/21 0931   07/25/21 0500  Phosphorus  Tomorrow morning,   STAT       Question:  Specimen collection method  Answer:  Lab=Lab collect   07/24/21 0931   07/25/21 0500  Protime-INR  Tomorrow morning,   STAT       Question:  Specimen collection method  Answer:  Lab=Lab collect   07/24/21 0931   07/24/21 1600  Heparin level (unfractionated)  Once-Timed,   STAT       Question:  Specimen collection method  Answer:  Lab=Lab collect   07/24/21 0933   07/24/21 0214  HIV Antibody (routine testing w rflx)  (HIV Antibody (Routine testing w reflex) panel)  Once,   STAT        07/24/21 0215            Signed, Terrilee Croak, MD [facility] 07/24/2021

## 2021-07-24 NOTE — Assessment & Plan Note (Addendum)
Troponin 58>700 but without complaints of chest pain or acute EKG changes Will treat with heparin infusion, Aspirin Echo to evaluate for wall motion abnormality Cardiology consulted Monitor for bleeding in view of thrombocytopenia, known splenic varices.

## 2021-07-24 NOTE — ED Provider Notes (Signed)
----------------------------------------- °  12:35 AM on 07/24/2021 -----------------------------------------   CT head interpreted per Dr. [place] Pink:  No evidence of acute intracranial abnormality. Mild small vessel  ischemic changes.   CT chest/abdomen/pelvis interpreted per Dr. Golden Circle:  CTA of the chest: No evidence of pulmonary emboli.     Mild bibasilar atelectasis right greater than left.     CT of the abdomen and pelvis: Changes of prior fiducial markers  within the liver. A vague area of decreased attenuation is noted  near the dome of the liver which may be related to the prior  ablation treatment. This is incompletely evaluated on this exam.     Diverticulosis without diverticulitis.     Changes consistent with portal hypertension with multiple splenic  varices and spontaneous left splenorenal shunt.     Stable appearing periportal and gastrohepatic lymph nodes when  compared with report from an MRI of 07/23/2021   Updated patient and his family.  Will consult hospitalist services for admission.   ----------------------------------------- 1:38 AM on 07/24/2021 -----------------------------------------   Second troponin > 700.  Discussed risk/benefits of initiating heparin with hospitalist given patient's thrombocytopenia and splenic varices; however, feel it would be beneficial in this patient likely having non-STEMI; will initiate.   Paulette Blanch, MD 07/24/21 [phone]

## 2021-07-24 NOTE — Assessment & Plan Note (Signed)
Patient was hypoxic with EMS requiring 6 L O2 to maintain sats in the mid 90s Etiology concerning for possible medication especially in view of past history Denies history of COPD.  May have underlying OSA History of mechanical ventilation for unintentional overdose of meds in therapeutic use CTA negative for PE or other acute finding Continue BiPAP and wean as tolerated

## 2021-07-24 NOTE — Progress Notes (Signed)
*  PRELIMINARY RESULTS* Echocardiogram 2D Echocardiogram has been performed.  Sherrie Sport 07/24/2021, 1:19 PM

## 2021-07-24 NOTE — Progress Notes (Signed)
ANTICOAGULATION CONSULT NOTE - Initial Consult  Pharmacy Consult for Heparin  Indication: ACS/NSTEMI   No Known Allergies  Patient Measurements: Height: 5\' 5"  (165.1 cm) Weight: 113.4 kg (250 lb) IBW/kg (Calculated) : 61.5 Heparin Dosing Weight: 87.8 kg   Vital Signs: Temp: 97.6 F (36.4 C) (02/01 2218) Temp Source: Rectal (02/01 2218) BP: 142/64 (02/02 0104) Pulse Rate: 95 (02/02 0104)  Labs: Recent Labs    07/23/21 2144 07/24/21 0048  HGB 15.0  --   HCT 44.3  --   PLT 104*  --   CREATININE 1.00  --   TROPONINIHS 58* 708*    Estimated Creatinine Clearance: 91.4 mL/min (by C-G formula based on SCr of 1 mg/dL).   Medical History: Past Medical History:  Diagnosis Date   Bulging lumbar disc    Diabetes mellitus without complication (HCC)    GERD (gastroesophageal reflux disease)    Hepatic encephalopathy    Hepatitis C    Hypertension    Migraines     Medications:  (Not in a hospital admission)   Assessment: Pharmacy consulted to dose heparin in this 61 year old male admitted with ACS/NSTEMI.  No prior anticoag noted.   CrCl = 91.4 ml/min   Goal of Therapy:  Heparin level 0.3-0.7 units/ml Monitor platelets by anticoagulation protocol: Yes   Plan:  Give 4000 units bolus x 1 Start heparin infusion at 1200 units/hr Check anti-Xa level in 6 hours and daily while on heparin Continue to monitor H&H and platelets  Justis Closser D 07/24/2021,1:56 AM

## 2021-07-25 ENCOUNTER — Other Ambulatory Visit: Payer: Self-pay

## 2021-07-25 ENCOUNTER — Inpatient Hospital Stay (HOSPITAL_COMMUNITY): Payer: Self-pay

## 2021-07-25 DIAGNOSIS — F1190 Opioid use, unspecified, uncomplicated: Secondary | ICD-10-CM

## 2021-07-25 DIAGNOSIS — K7460 Unspecified cirrhosis of liver: Secondary | ICD-10-CM

## 2021-07-25 DIAGNOSIS — I248 Other forms of acute ischemic heart disease: Secondary | ICD-10-CM

## 2021-07-25 LAB — CBG MONITORING, ED
Glucose-Capillary: 114 mg/dL — ABNORMAL HIGH (ref 70–99)
Glucose-Capillary: 129 mg/dL — ABNORMAL HIGH (ref 70–99)
Glucose-Capillary: 143 mg/dL — ABNORMAL HIGH (ref 70–99)
Glucose-Capillary: 188 mg/dL — ABNORMAL HIGH (ref 70–99)

## 2021-07-25 LAB — CBC WITH DIFFERENTIAL/PLATELET
Abs Immature Granulocytes: 0.01 10*3/uL (ref 0.00–0.07)
Basophils Absolute: 0 10*3/uL (ref 0.0–0.1)
Basophils Relative: 1 %
Eosinophils Absolute: 0.3 10*3/uL (ref 0.0–0.5)
Eosinophils Relative: 5 %
HCT: 38.3 % — ABNORMAL LOW (ref 39.0–52.0)
Hemoglobin: 12.8 g/dL — ABNORMAL LOW (ref 13.0–17.0)
Immature Granulocytes: 0 %
Lymphocytes Relative: 38 %
Lymphs Abs: 2.3 10*3/uL (ref 0.7–4.0)
MCH: 30.5 pg (ref 26.0–34.0)
MCHC: 33.4 g/dL (ref 30.0–36.0)
MCV: 91.2 fL (ref 80.0–100.0)
Monocytes Absolute: 0.5 10*3/uL (ref 0.1–1.0)
Monocytes Relative: 8 %
Neutro Abs: 2.9 10*3/uL (ref 1.7–7.7)
Neutrophils Relative %: 48 %
Platelets: 102 10*3/uL — ABNORMAL LOW (ref 150–400)
RBC: 4.2 MIL/uL — ABNORMAL LOW (ref 4.22–5.81)
RDW: 13.6 % (ref 11.5–15.5)
WBC: 6.1 10*3/uL (ref 4.0–10.5)
nRBC: 0 % (ref 0.0–0.2)

## 2021-07-25 LAB — NM MYOCAR MULTI W/SPECT W/WALL MOTION / EF
Estimated workload: 1
Exercise duration (min): 0 min
Exercise duration (sec): 0 s
LV dias vol: 118 mL (ref 62–150)
LV sys vol: 29 mL
MPHR: 160 {beats}/min
Nuc Stress EF: 75 %
Peak HR: 86 {beats}/min
Percent HR: 53 %
Rest HR: 81 {beats}/min
Rest Nuclear Isotope Dose: 10.2 mCi
SDS: 0
SRS: 5
SSS: 0
ST Depression (mm): 0 mm
Stress Nuclear Isotope Dose: 31.8 mCi
TID: 0.88

## 2021-07-25 LAB — BASIC METABOLIC PANEL
Anion gap: 3 — ABNORMAL LOW (ref 5–15)
BUN: 24 mg/dL — ABNORMAL HIGH (ref 6–20)
CO2: 28 mmol/L (ref 22–32)
Calcium: 8.6 mg/dL — ABNORMAL LOW (ref 8.9–10.3)
Chloride: 106 mmol/L (ref 98–111)
Creatinine, Ser: 1.01 mg/dL (ref 0.61–1.24)
GFR, Estimated: >60 mL/min (ref >60)
Glucose, Bld: 147 mg/dL — ABNORMAL HIGH (ref 70–99)
Potassium: 3.8 mmol/L (ref 3.5–5.1)
Sodium: 137 mmol/L (ref 135–145)

## 2021-07-25 LAB — PROTIME-INR
INR: 1.2 (ref 0.8–1.2)
Prothrombin Time: 15.6 seconds — ABNORMAL HIGH (ref 11.4–15.2)

## 2021-07-25 LAB — HEPARIN LEVEL (UNFRACTIONATED): Heparin Unfractionated: 0.43 IU/mL (ref 0.30–0.70)

## 2021-07-25 LAB — PHOSPHORUS: Phosphorus: 4 mg/dL (ref 2.5–4.6)

## 2021-07-25 LAB — MAGNESIUM: Magnesium: 2.2 mg/dL (ref 1.7–2.4)

## 2021-07-25 LAB — AMMONIA: Ammonia: 54 umol/L — ABNORMAL HIGH (ref 9–35)

## 2021-07-25 MED ORDER — ATORVASTATIN CALCIUM 10 MG PO TABS
10.0000 mg | ORAL_TABLET | Freq: Every day | ORAL | 2 refills | Status: DC
  Filled 2021-07-25: qty 30, 30d supply, fill #0

## 2021-07-25 MED ORDER — METOPROLOL TARTRATE 25 MG PO TABS
12.5000 mg | ORAL_TABLET | Freq: Two times a day (BID) | ORAL | 2 refills | Status: DC
  Filled 2021-07-25: qty 30, 30d supply, fill #0

## 2021-07-25 MED ORDER — ATORVASTATIN CALCIUM 10 MG PO TABS: 10.0000 mg | ORAL_TABLET | Freq: Every day | ORAL | 2 refills | Status: DC

## 2021-07-25 MED ORDER — TECHNETIUM TC 99M TETROFOSMIN IV KIT
10.1600 | PACK | Freq: Once | INTRAVENOUS | Status: AC | PRN
  Administered 2021-07-25: 10.16 via INTRAVENOUS

## 2021-07-25 MED ORDER — LANTUS SOLOSTAR 100 UNIT/ML ~~LOC~~ SOPN: 15.0000 [IU] | PEN_INJECTOR | Freq: Every day | SUBCUTANEOUS | 0 refills | Status: DC

## 2021-07-25 MED ORDER — METOPROLOL TARTRATE 25 MG PO TABS: 12.5000 mg | ORAL_TABLET | Freq: Two times a day (BID) | ORAL | 2 refills | Status: DC

## 2021-07-25 MED ORDER — REGADENOSON 0.4 MG/5ML IV SOLN
0.4000 mg | Freq: Once | INTRAVENOUS | Status: AC
  Administered 2021-07-25: 0.4 mg via INTRAVENOUS

## 2021-07-25 MED ORDER — NITROGLYCERIN 0.4 MG SL SUBL: 0.4000 mg | SUBLINGUAL_TABLET | SUBLINGUAL | 12 refills | Status: DC | PRN

## 2021-07-25 MED ORDER — ASPIRIN 81 MG PO TBEC: 81.0000 mg | DELAYED_RELEASE_TABLET | Freq: Every day | ORAL | 2 refills | Status: DC

## 2021-07-25 MED ORDER — TECHNETIUM TC 99M TETROFOSMIN IV KIT
31.7500 | PACK | Freq: Once | INTRAVENOUS | Status: AC | PRN
  Administered 2021-07-25: 31.75 via INTRAVENOUS

## 2021-07-25 MED ORDER — ALBUTEROL SULFATE (2.5 MG/3ML) 0.083% IN NEBU
2.5000 mg | INHALATION_SOLUTION | Freq: Once | RESPIRATORY_TRACT | Status: AC
  Administered 2021-07-25: 2.5 mg via RESPIRATORY_TRACT

## 2021-07-25 MED ORDER — NITROGLYCERIN 0.4 MG SL SUBL
0.4000 mg | SUBLINGUAL_TABLET | SUBLINGUAL | 12 refills | Status: DC | PRN
  Filled 2021-07-25: qty 25, 25d supply, fill #0

## 2021-07-25 MED ORDER — ASPIRIN 81 MG PO TBEC
81.0000 mg | DELAYED_RELEASE_TABLET | Freq: Every day | ORAL | 2 refills | Status: DC
  Filled 2021-07-25: qty 30, 30d supply, fill #0

## 2021-07-25 MED ORDER — IPRATROPIUM-ALBUTEROL 0.5-2.5 (3) MG/3ML IN SOLN: 3.0000 mL | RESPIRATORY_TRACT | Status: DC | PRN

## 2021-07-25 NOTE — Progress Notes (Signed)
RN entered room to find patient eating [employer] cheeseburger and soda despite NPO order; MD aware. On room air, patient is at 94% at rest.

## 2021-07-25 NOTE — Progress Notes (Signed)
Patient refusing cardiac monitoring; states "I am going home. That doctor said I can go home."   MD notified via EPIC secure chat.

## 2021-07-25 NOTE — Progress Notes (Signed)
Patient to stress test

## 2021-07-25 NOTE — TOC Initial Note (Signed)
Transition of Care [facility]) - Initial/Assessment Note    Patient Details  Name: Michael Mack MRN: [id number] Date of Birth: Sep 15, 1960  Transition of [facility]) CM/SW Contact:    Shelbie Hutching, RN Phone Number: 07/25/2021, 2:09 PM  Clinical Narrative:                 Patient cleared for discharge home with wife.  RNCM met with patient at the bedside, wife is present and will be transporting patient home.  Patient is current with his PCP, he goes to [facility], he gets prescriptions from [facility] or [facility]- he requests that the MD sent his scripts today to [facility].  No TOC needs identified.    Expected Discharge Plan: Home/Self Care Barriers to Discharge: Barriers Resolved   Patient Goals and CMS Choice Patient states their goals for this hospitalization and ongoing recovery are:: Patient glad to be going home      Expected Discharge Plan and Services Expected Discharge Plan: Home/Self Care   Discharge Planning Services: CM Consult   Living arrangements for the past 2 months: Single Family Home Expected Discharge Date: 07/25/21               DME Arranged: N/A DME Agency: NA       HH Arranged: NA [place] Agency: NA        Prior Living Arrangements/Services Living arrangements for the past 2 months: Single Family Home Lives with:: Spouse Patient language and need for interpreter reviewed:: Yes Do you feel safe going back to the place where you live?: Yes      Need for Family Participation in Patient Care: Yes (Comment) Care giver support system in place?: Yes (comment) (wife)   Criminal Activity/Legal Involvement Pertinent to Current Situation/Hospitalization: No - Comment as needed  Activities of Daily Living      Permission Sought/Granted Permission sought to share information with : Case Manager, Family Supports, Chartered certified accountant granted to share information with : Yes, Verbal Permission Granted      Permission granted to share info w AGENCY: [facility]        Emotional Assessment Appearance:: Appears older than stated age Attitude/Demeanor/Rapport: Engaged Affect (typically observed): Accepting Orientation: : Oriented to Self, Oriented to Place, Oriented to  Time, Oriented to Situation Alcohol / Substance Use: Not Applicable Psych Involvement: No (comment)  Admission diagnosis:  Acute respiratory failure ([place]) [J96.00] Patient Active Problem List   Diagnosis Date Noted   Increased ammonia level 07/24/2021   Obesity, Class III, BMI 40-49.9 (morbid obesity) ([place]) 07/24/2021   Chronic, continuous use of opioids 07/24/2021   NSTEMI (non-ST elevated myocardial infarction) ([place]) 07/24/2021   Elevated carcinoembryonic antigen (CEA) 01/22/2021   Hepatocellular carcinoma ([place]) 07/03/2020   Acute kidney injury ([place]) 12/06/2019   Chronic pain 12/06/2019   Altered mental status 12/02/2019   Acute respiratory failure with hypoxia and hypercapnia ([place])    Acute metabolic encephalopathy 44/06/270   Thrombocytopenia ([place]) 09/11/2015   Cirrhosis ([place]) 09/11/2015   Hepatitis C 04/18/2015   Diabetes ([place]) 01/01/2015   GERD (gastroesophageal reflux disease) 01/01/2015   Hypertension 01/01/2015   Fatty liver 01/01/2015   PCP:  Tawni Millers, MD Pharmacy:   [facility] [address] [place] [address] Phone: [phone] Fax: [phone]  [place], [place] - [address] [place] [address] Phone: [phone] Fax:  [phone]     Social Determinants of Health (SDOH) Interventions    Readmission Risk Interventions Readmission Risk Prevention Plan 07/25/2021  Transportation Screening Complete  PCP or Specialist Appt within 3-5 Days Complete  HRI or [place] Complete  Social Work Consult for [place] Planning/Counseling Complete  Palliative Care Screening Not  Applicable  Medication Review Press photographer) Complete  Some recent data might be hidden

## 2021-07-25 NOTE — Progress Notes (Signed)
ANTICOAGULATION CONSULT NOTE  Pharmacy Consult for Heparin  Indication: ACS/NSTEMI   No Known Allergies  Patient Measurements: Height: 5\' 5"  (165.1 cm) Weight: 113.4 kg (250 lb) IBW/kg (Calculated) : 61.5 Heparin Dosing Weight: 87.8 kg   Vital Signs: BP: 111/69 (02/03 0430) Pulse Rate: 77 (02/03 0430)  Labs: Recent Labs    07/23/21 2144 07/24/21 0048 07/24/21 0157 07/24/21 0759 07/24/21 1748 07/25/21 0436  HGB 15.0  --   --  12.5*  --  12.8*  HCT 44.3  --   --  36.6*  --  38.3*  PLT 104*  --   --  86*  --  102*  APTT  --   --  29  --   --   --   LABPROT  --   --  15.2  --   --  15.6*  INR  --   --  1.2  --   --  1.2  HEPARINUNFRC  --   --   --  0.14* 0.14* 0.43  CREATININE 1.00  --   --  0.97  --   --   TROPONINIHS 58* 708*  --  1,611*  --   --      Estimated Creatinine Clearance: 94.3 mL/min (by C-G formula based on SCr of 0.97 mg/dL).   Medical History: Past Medical History:  Diagnosis Date   Bulging lumbar disc    Chronic pain    Diabetes mellitus without complication (HCC)    GERD (gastroesophageal reflux disease)    Hepatic encephalopathy    Hepatitis C    Hepatocellular carcinoma (HCC)    Hypertension    Migraines    Morbid obesity ([place])    Thrombocytopenia ([place])     Assessment: Pharmacy consulted to dose heparin in this 61 year old male w/ PMH of obesity, HTN, HL, DMII, hepatocellular carcinoma, cirrhosis, hepatic encephalopathy, chronic pain, Hep C, headaches, and anxiety admitted with ACS/NSTEMI.  No prior anticoag noted.  H&H, platelets (low at baseline) trending down  HsTrop has elevated from 58  700  1611  Goal of Therapy:  Heparin level 0.3-0.7 units/ml Monitor platelets by anticoagulation protocol: Yes   Plan:  2/3:  HL @ 0436 = 0.43, therapeutic X 1  Will continue pt on current rate and draw confirmation level in 6 hrs on 2/3 @ 1100.   Elisabet Gutzmer D, PharmD  07/25/2021,5:15 AM

## 2021-07-25 NOTE — Discharge Summary (Addendum)
Physician Discharge Summary  [name] XJO:[id number] DOB: 1960-09-13 DOA: 07/23/2021  PCP: Michael Millers, MD  Admit date: 07/23/2021 Discharge date: 07/25/2021  Admitted From: Home Discharge disposition: Home  Recommendations at discharge:  Continue lactulose and Xifaxan Your blood pressure medicines have been switched to metoprolol twice a day and Lasix daily as needed. Recommend to get sleep study as an outpatient as you may have underlying sleep apnea. Recommend to minimize the use of pain medicines under the supervision of her pain management specialist Follow-up with cardiology as an outpatient. Follow-up with GI as an outpatient for liver cirrhosis  Discharge Diagnosis:   Principal Problem:   Acute metabolic encephalopathy Active Problems:   Diabetes ([place])   Hypertension   Cirrhosis ([place])   Acute respiratory failure with hypoxia and hypercapnia (H[facility])   Chronic pain   Hepatocellular carcinoma (H[facility])   Increased ammonia level   Obesity, Class III, BMI 40-49.9 (morbid obesity) (HCC)   Chronic, continuous use of opioids   NSTEMI (non-ST elevated myocardial infarction) ([place])   Brief narrative: Michael Mack is a 61 y.o. male with PMH significant for morbid obesity, DM2, HTN, hepatitis C, liver cirrhosis, hepatorenal carcinoma, chronic pain on opioids, last hospitalized in June 2021 with respiratory failure requiring mechanical ventilation x2 days believed secondary to unintentional overdose of anxiolytics and opioids. Patient was brought to the ED on 07/23/2021 after a fall. Patient had an abdominal MRI earlier in the day for which he is usually sedated but was not this time due to hypoxia.  On his return home he went outside to get something and after 20 minutes of not returning in the house, he was found on the ground beside a pond on the property, partially submerged, face up, awake but had trouble breathing.   EMS noted him altered and hypoxic, started him on [facility]  medical and brought to the ED.   In the ED, patient was able to recall the event, stating he slipped in the mud and fell and was unable to get himself up. He was afebrile, heart rate 107, respiratory 28, blood pressure 133/102, required 6 L by nasal cannula to maintain saturation over 90% Venous blood gas with pH 7.3, PCO2 62, PO2 54, HCO3 31 CMP showed glucose elevated at 218 and ammonia level elevated to 107 Initial troponin elevated 58 CBC unremarkable except for platelet slightly low at 104 Respiratory virus panel negative EKG with sinus tachycardia at 109, no ST-T wave changes Chest x-ray showed eventration of the right hemidiaphragm with mild right basilar atelectasis. CT head did not show any acute intracranial abnormality CTA chest did not show any pulm embolism, showed mild bibasilar atelectasis right greater than left. CT of abdomen and pelvis did not show any acute intra-abdominal pathology, showed chronic changes in the liver related to previous ablation treatment.  It showed changes consistent with portal hypertension with multiple splenic varices and a spontaneous left splenorenal shunt. Patient was treated with DuoNeb x3, given a dose of lactulose.  Placed on BiPAP  Troponin on subsequent check trended up to 1600 Cardiology was consulted, heparin drip was started Admitted to hospitalist service  Subjective: Patient was seen and examined this morning.   Lying in bed.  Not in distress.  Feels better.  Not on supplemental oxygen.  Underwent a stress test this morning.  Cleared by cardiology for discharge.     Hospital course: Acute metabolic encephalopathy -Multifactorial: Hyperammonemia, hypercapnia, opiate/benzodiazepine use. -Urine drug screen negative for nonprescription  drugs -Mental status has improved back to baseline.  NSTEMI (non-ST elevated myocardial infarction) -Troponin 58>700>1600 but without complaints of chest pain or acute EKG changes -Cardiology was  consulted.  Started on heparin drip and aspirin. -Echocardiogram showed EF 60 to 65%, no regional wall motion abnormality, mild LVH, mildly enlarged RV size. -Patient underwent stress test done this morning.  No evidence of ischemia.  No need of further intervention at this time.  Okay to discharge and follow-up with cardiology as an outpatient.  Aspirin, statin and metoprolol 12.5 mg twice daily have been initiated  Essential hypertension -Home meds include HCTZ 25 mg daily, lisinopril 40 mg daily -Currently patient is on metoprolol 12.5 mg twice daily and Lasix 20 mg daily as needed.  I would switch his regular blood pressure medicines to metoprolol and Lasix at discharge.  Continue to follow-up with cardiology as an outpatient.   Liver cirrhosis with portal hypertension Hyperammonemia -CT abdomen showed portal hypertension with multiple splenic varices and a spontaneous left splenorenal shunt. -No evidence of ascites. -Ammonia level was elevated to 107. -Continued on lactulose and Xifaxan.  Repeat ammonia levels with improvement. -Patient does not have florid hypervolemia but may benefit from low-dose of diuretics on a daily basis.  Lasix 20 mg one dose was given yesterday.  Blood pressure and creatinine stable.  Discharged on Lasix 20 mg daily as needed. Recent Labs  Lab 07/23/21 2144 07/25/21 0436  AMMONIA 107* 54*   Acute respiratory failure with hypoxia and hypercapnia -Venous blood gas with pH of 7.3, PCO2 62 and bicarb at 31.  Patient probably has compensated chronic respiratory acidosis but not requiring supplemental oxygen at home.   -Acute hypoxia could be related to acute metabolic encephalopathy.  Chest imaging also showed bilateral atelectasis.  Based on his morbid obesity, I would assume he may have underlying sleep apnea as well.  Sleep study recommended as an outpatient. -CTA negative for pulm embolism, pneumonia, pulm edema or pleural effusion -Initially patient required  BiPAP.  Currently not on supplemental oxygen.  No need of oxygen at discharge  Hepatocellular carcinoma History of hepatitis C -Follows up at [facility].  Had MRI liver on 07/23/2021.  Thrombocytopenia -Related to liver cirrhosis -Stable platelet count Recent Labs  Lab 07/23/21 2144 07/24/21 0759 07/25/21 0436  PLT 104* 86* 102*   Type 2 diabetes mellitus with hyperglycemia -A1c 7.6 on 06/25/2021 -Home meds include Lantus 15 units daily, insulin lispro 35 units 3 times daily with meals -Continue the same. Recent Labs  Lab 07/24/21 2012 07/25/21 0037 07/25/21 0423 07/25/21 0818 07/25/21 1211  GLUCAP 173* 129* 143* 114* 188*    Morbid obesity  -Body mass index is 41.6 kg/m. Patient has been advised to make an attempt to improve diet and exercise patterns to aid in weight loss.  Chronic pain on opioids History of unintentional overdose requiring mechanical ventilation -Home meds include oxycodone 20 mg every 4 hours as needed and methadone 30 mg 3 times daily as needed.  He states he gets it through a pain management specialist.  Encouraged to minimize use of pain medicine.  Anxiety/depression -Home meds include amitriptyline 50 mg at bedtime, Adderall 20 mg daily.  Continue the same.  Mobility: Encourage ambulation. Living condition: Lives at home with wife Goals of care:   Code Status: Full Code  Nutritional status: Body mass index is 41.6 kg/m.        Discharge Exam:   Vitals:   07/25/21 3112 07/25/21 0741 07/25/21 1624 07/25/21  1358  BP:      Pulse: 66 65 66 96  Resp: 17 17 (!) 21 (!) 22  Temp:    (P) 98 F (36.7 C)  TempSrc:      SpO2: (!) 88% (!) 88% 90% 96%  Weight:      Height:        Body mass index is 41.6 kg/m.   General exam: Pleasant, middle-aged morbidly obese male.  Not in physical distress Skin: No rashes, lesions or ulcers. HEENT: Atraumatic, normocephalic, no obvious bleeding Lungs: Diminished air entry in both bases. CVS: Regular rate and  rhythm, no murmur GI/Abd soft, distended from obesity, nontender, bowel sound present CNS: Alert, awake, oriented x3 Psychiatry: Mood appropriate Extremities: Chronic stasis changes, trace bilateral pedal edema  Follow ups:    Follow-up Information     Michael Millers, MD Follow up.   Specialty: Internal Medicine Contact information: [address] [place] [address] [phone]         Minna Merritts, MD .   Specialty: Cardiology Contact information: [place] [address] [phone]                 Discharge Instructions:   Discharge Instructions     Call MD for:  difficulty breathing, headache or visual disturbances   Complete by: As directed    Call MD for:  extreme fatigue   Complete by: As directed    Call MD for:  hives   Complete by: As directed    Call MD for:  persistant dizziness or light-headedness   Complete by: As directed    Call MD for:  persistant nausea and vomiting   Complete by: As directed    Call MD for:  severe uncontrolled pain   Complete by: As directed    Call MD for:  temperature >100.4   Complete by: As directed    Diet - low sodium heart healthy   Complete by: As directed    Diet Carb Modified   Complete by: As directed    Discharge instructions   Complete by: As directed    Recommendations at discharge:  1. Continue lactulose and Xifaxan 2. Your blood pressure medicines have been switched to metoprolol twice a day and Lasix daily as needed. 3. Recommend to get sleep study as an outpatient as you may have underlying sleep apnea. 4. Recommend to minimize the use of pain medicines under the supervision of her pain management specialist 5. Follow-up with cardiology as an outpatient. 6. Follow-up with GI as an outpatient for liver cirrhosis  Discharge instructions for diabetes mellitus: Check blood sugar 3 times a day and bedtime at home. If blood sugar running above 200 or less  than 70 please call your MD to adjust insulin. If you notice signs and symptoms of hypoglycemia (low blood sugar) like jitteriness, confusion, thirst, tremor and sweating, please check blood sugar, drink sugary drink/biscuits/sweets to increase sugar level and call MD or return to ER.    Discharge instructions for CHF Check weight daily -preferably same time every day. Restrict fluid intake to 1200 ml daily Restrict salt intake to less than 2 g daily. Call MD if you have one of the following symptoms 1) 3 pound weight gain in 24 hours or 5 pounds in 1 week  2) swelling in the hands, feet or stomach  3) progressive shortness of breath 4) if you have to sleep on extra pillows at  night in order to breathe     General discharge instructions: Follow with Primary MD Michael Millers, MD in 7 days  Please request your PCP  to go over your hospital tests, procedures, radiology results at the follow up. Please get your medicines reviewed and adjusted.  Your PCP may decide to repeat certain labs or tests as needed. Do not drive, operate heavy machinery, perform activities at heights, swimming or participation in water activities or provide baby sitting services if your were admitted for syncope or siezures until you have seen by Primary MD or a Neurologist and advised to do so again. [place] Controlled Substance Reporting System database was reviewed. Do not drive, operate heavy machinery, perform activities at heights, swim, participate in water activities or provide baby-sitting services while on medications for pain, sleep and mood until your outpatient physician has reevaluated you and advised to do so again.  You are strongly recommended to comply with the dose, frequency and duration of prescribed medications. Activity: As tolerated with Full fall precautions use walker/cane & assistance as needed Avoid using any recreational substances like cigarette, tobacco, alcohol, or non-prescribed  drug. If you experience worsening of your admission symptoms, develop shortness of breath, life threatening emergency, suicidal or homicidal thoughts you must seek medical attention immediately by calling 911 or calling your MD immediately  if symptoms less severe. You must read complete instructions/literature along with all the possible adverse reactions/side effects for all the medicines you take and that have been prescribed to you. Take any new medicine only after you have completely understood and accepted all the possible adverse reactions/side effects.  Wear Seat belts while driving. You were cared for by a hospitalist during your hospital stay. If you have any questions about your discharge medications or the care you received while you were in the hospital after you are discharged, you can call the unit and ask to speak with the hospitalist or the covering physician. Once you are discharged, your primary care physician will handle any further medical issues. Please note that NO REFILLS for any discharge medications will be authorized once you are discharged, as it is imperative that you return to your primary care physician (or establish a relationship with a primary care physician if you do not have one).   Increase activity slowly   Complete by: As directed        Discharge Medications:   Allergies as of 07/25/2021   No Known Allergies      Medication List     STOP taking these medications    hydrochlorothiazide 25 MG tablet Commonly known as: HYDRODIURIL   lisinopril 40 MG tablet Commonly known as: ZESTRIL       TAKE these medications    acetaminophen 500 MG tablet Commonly known as: TYLENOL Take 500 mg by mouth as needed for headache.   amitriptyline 50 MG tablet Commonly known as: ELAVIL 100 mg at bedtime.   amphetamine-dextroamphetamine 20 MG tablet Commonly known as: ADDERALL Take 20 mg by mouth daily.   aspirin 81 MG EC tablet Take 1 tablet (81 mg total)  by mouth daily. Swallow whole. Start taking on: July 26, 2021   atorvastatin 10 MG tablet Commonly known as: LIPITOR Take 1 tablet (10 mg total) by mouth daily. Start taking on: July 26, 2021   Cholecalciferol 25 MCG (1000 UT) tablet Take 1,000 Units by mouth daily.   Comfort EZ Pen Needles 32G X 4 MM Misc Generic drug: Insulin Pen Needle  Use as directed with Insulin   HumaLOG KwikPen 100 UNIT/ML KwikPen Generic drug: insulin lispro Inject 35 Units into the skin 3 (three) times daily with meals.   lactulose 10 GM/15ML solution Commonly known as: CHRONULAC TAKE 30ML BY MOUTH 3 TIMES DAILY.   Lantus SoloStar 100 UNIT/ML Solostar Pen Generic drug: insulin glargine Inject 15 Units into the skin daily. What changed:  how much to take how to take this when to take this   methadone 10 MG tablet Commonly known as: DOLOPHINE 30 mg every 8 (eight) hours as needed for moderate pain or severe pain.   metoprolol tartrate 25 MG tablet Commonly known as: LOPRESSOR Take 0.5 tablets (12.5 mg total) by mouth 2 (two) times daily.   multivitamin-iron-minerals-folic acid chewable tablet Chew 1 tablet by mouth daily.   naloxone 4 MG/0.1ML Liqd nasal spray kit Commonly known as: NARCAN Place into the nose.   nitroGLYCERIN 0.4 MG SL tablet Commonly known as: NITROSTAT Place 1 tablet (0.4 mg total) under the tongue every 5 (five) minutes x 3 doses as needed for up to 3 days for chest pain.   Oxycodone HCl 20 MG Tabs Take 1 tablet by mouth every 4 (four) hours as needed. Prescribed by pain management   Xifaxan 550 MG Tabs tablet Generic drug: rifaximin Take 1 tablet (550 mg total) by mouth 2 (two) times daily.         The results of significant diagnostics from this hospitalization (including imaging, microbiology, ancillary and laboratory) are listed below for reference.    Procedures and Diagnostic Studies:   CT HEAD WO CONTRAST (5MM)  Result Date: 07/24/2021 CLINICAL  DATA:  Altered mental status EXAM: CT HEAD WITHOUT CONTRAST TECHNIQUE: Contiguous axial images were obtained from the base of the skull through the vertex without intravenous contrast. RADIATION DOSE REDUCTION: This exam was performed according to the departmental dose-optimization program which includes automated exposure control, adjustment of the mA and/or kV according to patient size and/or use of iterative reconstruction technique. COMPARISON:  CT head dated 12/02/2019 FINDINGS: Brain: No evidence of acute infarction, hemorrhage, hydrocephalus, extra-axial collection or mass lesion/mass effect. Mild subcortical white matter and periventricular small vessel ischemic changes. Vascular: Mild intracranial atherosclerosis. Skull: Normal. Negative for fracture or focal lesion. Sinuses/Orbits: Mild partial opacification of the right maxillary sinus. Visualized paranasal sinuses and mastoid air cells are otherwise clear. Other: None. IMPRESSION: No evidence of acute intracranial abnormality. Mild small vessel ischemic changes. Electronically Signed   By: Julian Hy M.D.   On: 07/24/2021 00:21   CT Angio Chest PE W and/or Wo Contrast  Result Date: 07/24/2021 CLINICAL DATA:  Found face down in pond with shortness of breath, initial encounter EXAM: CT ANGIOGRAPHY CHEST CT ABDOMEN AND PELVIS WITH CONTRAST TECHNIQUE: Multidetector CT imaging of the chest was performed using the standard protocol during bolus administration of intravenous contrast. Multiplanar CT image reconstructions and MIPs were obtained to evaluate the vascular anatomy. Multidetector CT imaging of the abdomen and pelvis was performed using the standard protocol during bolus administration of intravenous contrast. RADIATION DOSE REDUCTION: This exam was performed according to the departmental dose-optimization program which includes automated exposure control, adjustment of the mA and/or kV according to patient size and/or use of iterative  reconstruction technique. CONTRAST:  159m OMNIPAQUE IOHEXOL 350 MG/ML SOLN COMPARISON:: COMPARISON: Chest x-ray from earlier in the same day, CT from 11/12/2009. FINDINGS: CTA CHEST FINDINGS Cardiovascular: Thoracic aorta shows atherosclerotic calcifications without aneurysmal dilatation or dissection. No cardiac enlargement is  noted. No pericardial effusion is seen. No coronary calcifications are noted. The pulmonary artery shows a normal branching pattern without definitive filling defect to suggest pulmonary embolism. Mediastinum/Nodes: Thoracic inlet is within normal limits. No sizable hilar or mediastinal adenopathy is noted. The esophagus is within normal limits. Lungs/Pleura: Bibasilar atelectatic changes are noted slightly worse on the right than the left. No sizable effusion is seen. Musculoskeletal: Mild degenerative change of the thoracic spine is noted. No rib abnormality is noted. Review of the MIP images confirms the above findings. CT ABDOMEN and PELVIS FINDINGS Hepatobiliary: Liver demonstrates linear metallic densities throughout the right lobe consistent with prior fiducial marking. A vague area of decreased attenuation is noted in the dome of the liver just beneath the diaphragm but incompletely evaluated on this exam. Cirrhotic changes of liver are noted. The gallbladder is within normal limits. Pancreas: Unremarkable. No pancreatic ductal dilatation or surrounding inflammatory changes. Spleen: Spleen is prominent. Multiple splenic varices are identified increased in size when compared with the prior exam from 2011. 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 No significant esophageal or gastric varices are seen. Adrenals/Urinary Tract: Adrenal glands are unremarkable. Kidneys demonstrate a normal enhancement pattern. No renal calculi or obstructive changes are noted. Normal excretion is noted on delayed images. The bladder is well distended. Stomach/Bowel: Mild diverticular change of  the colon is noted without evidence of diverticulitis. No obstructive or inflammatory changes are seen. The appendix is within normal limits. No small bowel abnormality is noted. Stomach is within normal limits. Vascular/Lymphatic: Aortic atherosclerosis. Periportal and gastrohepatic lymph nodes are noted. The largest of these is seen on image number 29 of series 3 measuring up to 13 mm in short axis. No other significant lymphadenopathy is noted. Reproductive: Prostate is unremarkable. Other: No abdominal wall hernia or abnormality. No abdominopelvic ascites. Musculoskeletal: No acute or significant osseous findings. Review of the MIP images confirms the above findings. IMPRESSION: CTA of the chest: No evidence of pulmonary emboli. Mild bibasilar atelectasis right greater than left. 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 A vague area of decreased attenuation is noted near the dome of the liver which may be related to the prior ablation treatment. This is incompletely evaluated on this exam. Diverticulosis without diverticulitis. Changes consistent with portal hypertension with multiple splenic varices and spontaneous left splenorenal shunt. Stable appearing periportal and gastrohepatic lymph nodes when compared with report from an MRI of 07/23/2021 Electronically Signed   By: Inez Catalina M.D.   On: 07/24/2021 00:24   CT ABDOMEN PELVIS W CONTRAST  Result Date: 07/24/2021 CLINICAL DATA:  Found face down in pond with shortness of breath, initial encounter EXAM: CT ANGIOGRAPHY CHEST CT ABDOMEN AND PELVIS WITH CONTRAST TECHNIQUE: Multidetector CT imaging of the chest was performed using the standard protocol during bolus administration of intravenous contrast. Multiplanar CT image reconstructions and MIPs were obtained to evaluate the vascular anatomy. Multidetector CT imaging of the abdomen and pelvis was performed using the standard protocol during bolus administration of  intravenous contrast. RADIATION DOSE REDUCTION: This exam was performed according to the departmental dose-optimization program which includes automated exposure control, adjustment of the mA and/or kV according to patient size and/or use of iterative reconstruction technique. CONTRAST:  151m OMNIPAQUE IOHEXOL 350 MG/ML SOLN COMPARISON:: COMPARISON: Chest x-ray from earlier in the same day, CT from 11/12/2009. FINDINGS: CTA CHEST FINDINGS Cardiovascular: Thoracic aorta shows atherosclerotic calcifications without aneurysmal dilatation or dissection. No cardiac enlargement is  noted. No pericardial effusion is seen. No coronary calcifications are noted. The pulmonary artery shows a normal branching pattern without definitive filling defect to suggest pulmonary embolism. Mediastinum/Nodes: Thoracic inlet is within normal limits. No sizable hilar or mediastinal adenopathy is noted. The esophagus is within normal limits. Lungs/Pleura: Bibasilar atelectatic changes are noted slightly worse on the right than the left. No sizable effusion is seen. Musculoskeletal: Mild degenerative change of the thoracic spine is noted. No rib abnormality is noted. Review of the MIP images confirms the above findings. CT ABDOMEN and PELVIS FINDINGS Hepatobiliary: Liver demonstrates linear metallic densities throughout the right lobe consistent with prior fiducial marking. A vague area of decreased attenuation is noted in the dome of the liver just beneath the diaphragm but incompletely evaluated on this exam. Cirrhotic changes of liver are noted. The gallbladder is within normal limits. Pancreas: Unremarkable. No pancreatic ductal dilatation or surrounding inflammatory changes. Spleen: Spleen is prominent. Multiple splenic varices are identified increased in size when compared with the prior exam from 2011. 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 No significant esophageal or gastric varices are seen. Adrenals/Urinary Tract:  Adrenal glands are unremarkable. Kidneys demonstrate a normal enhancement pattern. No renal calculi or obstructive changes are noted. Normal excretion is noted on delayed images. The bladder is well distended. Stomach/Bowel: Mild diverticular change of the colon is noted without evidence of diverticulitis. No obstructive or inflammatory changes are seen. The appendix is within normal limits. No small bowel abnormality is noted. Stomach is within normal limits. Vascular/Lymphatic: Aortic atherosclerosis. Periportal and gastrohepatic lymph nodes are noted. The largest of these is seen on image number 29 of series 3 measuring up to 13 mm in short axis. No other significant lymphadenopathy is noted. Reproductive: Prostate is unremarkable. Other: No abdominal wall hernia or abnormality. No abdominopelvic ascites. Musculoskeletal: No acute or significant osseous findings. Review of the MIP images confirms the above findings. IMPRESSION: CTA of the chest: No evidence of pulmonary emboli. Mild bibasilar atelectasis right greater than left. 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 A vague area of decreased attenuation is noted near the dome of the liver which may be related to the prior ablation treatment. This is incompletely evaluated on this exam. Diverticulosis without diverticulitis. Changes consistent with portal hypertension with multiple splenic varices and spontaneous left splenorenal shunt. Stable appearing periportal and gastrohepatic lymph nodes when compared with report from an MRI of 07/23/2021 Electronically Signed   By: Inez Catalina M.D.   On: 07/24/2021 00:24   ECHOCARDIOGRAM COMPLETE  Result Date: 07/24/2021    ECHOCARDIOGRAM REPORT   Patient Name:   Michael Mack Date of Exam: 07/24/2021 Medical Rec #:  [id number]      Height:       65.0 in Accession #:    [id number]     Weight:       250.0 lb Date of Birth:  [birth year]      BSA:          2.175 m Patient Age:    65  years       BP:           119/94 mmHg Patient Gender: M              HR:           90 bpm. Exam Location:  [facility] Procedure: 2D Echo, Color Doppler and Cardiac Doppler Indications:     NSTEMI I21.4  History:         Patient has prior history of Echocardiogram examinations, most                  recent 12/01/2020. Risk Factors:Hypertension and Diabetes.  Sonographer:     Sherrie Sport Referring Phys:  Raymond Diagnosing Phys: Ida Rogue MD  Sonographer Comments: Technically challenging study due to limited acoustic windows, no apical window and no subcostal window. IMPRESSIONS  1. Left ventricular ejection fraction, by estimation, is 60 to 65%. The left ventricle has normal function. The left ventricle has no regional wall motion abnormalities. There is mild left ventricular hypertrophy. Left ventricular diastolic parameters are indeterminate.  2. Right ventricular systolic function is normal. The right ventricular size is mildly enlarged. Tricuspid regurgitation signal is inadequate for assessing PA pressure.  3. The mitral valve is normal in structure. Mild mitral valve regurgitation. No evidence of mitral stenosis.  4. The aortic valve is normal in structure. Aortic valve regurgitation is not visualized. Aortic valve sclerosis/calcification is present, without any evidence of aortic stenosis. FINDINGS  Left Ventricle: Left ventricular ejection fraction, by estimation, is 60 to 65%. The left ventricle has normal function. The left ventricle has no regional wall motion abnormalities. The left ventricular internal cavity size was normal in size. There is  mild left ventricular hypertrophy. Left ventricular diastolic parameters are indeterminate. Right Ventricle: The right ventricular size is mildly enlarged. No increase in right ventricular wall thickness. Right ventricular systolic function is normal. Tricuspid regurgitation signal is inadequate for assessing PA pressure. Left Atrium: Left atrial size  was normal in size. Right Atrium: Right atrial size was normal in size. Pericardium: There is no evidence of pericardial effusion. Mitral Valve: The mitral valve is normal in structure. Mild mitral valve regurgitation. No evidence of mitral valve stenosis. Tricuspid Valve: The tricuspid valve is normal in structure. Tricuspid valve regurgitation is not demonstrated. No evidence of tricuspid stenosis. Aortic Valve: The aortic valve is normal in structure. Aortic valve regurgitation is not visualized. Aortic valve sclerosis/calcification is present, without any evidence of aortic stenosis. Pulmonic Valve: The pulmonic valve was normal in structure. Pulmonic valve regurgitation is not visualized. No evidence of pulmonic stenosis. Aorta: The aortic root is normal in size and structure. Venous: The pulmonary veins were not well visualized. The inferior vena cava was not well visualized. IAS/Shunts: No atrial level shunt detected by color flow Doppler.  LEFT VENTRICLE PLAX 2D LVIDd:         4.70 cm LVIDs:         2.30 cm LV PW:         1.20 cm LV IVS:        1.40 cm LVOT diam:     2.00 cm LVOT Area:     3.14 cm  LEFT ATRIUM         Index LA diam:    4.00 cm 1.84 cm/m                        PULMONIC VALVE AORTA                 PV Vmax:        0.97 m/s Ao Root diam: 3.23 cm PV Vmean:       61.850 cm/s                       PV VTI:  0.184 m                       PV Peak grad:   3.8 mmHg                       PV Mean grad:   1.5 mmHg                       RVOT Peak grad: 6 mmHg   SHUNTS Systemic Diam: 2.00 cm Pulmonic VTI:  0.204 m Ida Rogue MD Electronically signed by Ida Rogue MD Signature Date/Time: 07/24/2021/3:16:37 PM    Final      Labs:   Basic Metabolic Panel: Recent Labs  Lab 07/23/21 2144 07/24/21 0759 07/25/21 0436  NA 140 140 137  K 3.8 4.0 3.8  CL 103 107 106  CO2 '27 27 28  ' GLUCOSE 218* 197* 147*  BUN 19 24* 24*  CREATININE 1.00 0.97 1.01  CALCIUM 9.2 8.9 8.6*  MG  --   --   2.2  PHOS  --   --  4.0   GFR Estimated Creatinine Clearance: 90.5 mL/min (by C-G formula based on SCr of 1.01 mg/dL). Liver Function Tests: Recent Labs  Lab 07/23/21 2144  AST 39  ALT 20  ALKPHOS 61  BILITOT 0.7  PROT 7.8  ALBUMIN 3.9   No results for input(s): LIPASE, AMYLASE in the last 168 hours. Recent Labs  Lab 07/23/21 2144 07/25/21 0436  AMMONIA 107* 54*   Coagulation profile Recent Labs  Lab 07/24/21 0157 07/25/21 0436  INR 1.2 1.2    CBC: Recent Labs  Lab 07/23/21 2144 07/24/21 0759 07/25/21 0436  WBC 8.3 5.8 6.1  NEUTROABS 6.1  --  2.9  HGB 15.0 12.5* 12.8*  HCT 44.3 36.6* 38.3*  MCV 91.0 91.0 91.2  PLT 104* 86* 102*   Cardiac Enzymes: No results for input(s): CKTOTAL, CKMB, CKMBINDEX, TROPONINI in the last 168 hours. BNP: Invalid input(s): POCBNP CBG: Recent Labs  Lab 07/24/21 2012 07/25/21 0037 07/25/21 0423 07/25/21 0818 07/25/21 1211  GLUCAP 173* 129* 143* 114* 188*   D-Dimer No results for input(s): DDIMER in the last 72 hours. Hgb A1c No results for input(s): HGBA1C in the last 72 hours. Lipid Profile Recent Labs    07/24/21 0759  CHOL 129  HDL 43  LDLCALC 73  TRIG 64  CHOLHDL 3.0   Thyroid function studies No results for input(s): TSH, T4TOTAL, T3FREE, THYROIDAB in the last 72 hours.  Invalid input(s): FREET3 Anemia work up No results for input(s): VITAMINB12, FOLATE, FERRITIN, TIBC, IRON, RETICCTPCT in the last 72 hours. Microbiology Recent Results (from the past 240 hour(s))  Resp Panel by RT-PCR (Flu A&B, Covid) Nasopharyngeal Swab     Status: None   Collection Time: 07/23/21  9:44 PM   Specimen: Nasopharyngeal Swab; Nasopharyngeal(NP) swabs in vial transport medium  Result Value Ref Range Status   SARS Coronavirus 2 by RT PCR NEGATIVE NEGATIVE Final    Comment: (NOTE) SARS-CoV-2 target nucleic acids are NOT DETECTED.  The SARS-CoV-2 RNA is generally detectable in upper respiratory specimens during the acute  phase of infection. The lowest concentration of SARS-CoV-2 viral copies this assay can detect is 138 copies/mL. A negative result does not preclude SARS-Cov-2 infection and should not be used as the sole basis for treatment or other patient management decisions. A negative result may occur with  improper specimen collection/handling, submission of specimen  other than nasopharyngeal swab, presence of viral mutation(s) within the areas targeted by this assay, and inadequate number of viral copies(<138 copies/mL). A negative result must be combined with clinical observations, patient history, and epidemiological information. The expected result is Negative.  Fact Sheet for Patients:  EntrepreneurPulse.com.au  Fact Sheet for Healthcare Providers:  IncredibleEmployment.be  This test is no t yet approved or cleared by the [place] FDA and  has been authorized for detection and/or diagnosis of SARS-CoV-2 by FDA under an Emergency Use Authorization (EUA). This EUA will remain  in effect (meaning this test can be used) for the duration of the COVID-19 declaration under Section 564(b)(1) of the Act, 21 U.S.C.section 360bbb-3(b)(1), unless the authorization is terminated  or revoked sooner.       Influenza A by PCR NEGATIVE NEGATIVE Final   Influenza B by PCR NEGATIVE NEGATIVE Final    Comment: (NOTE) The Xpert Xpress SARS-CoV-2/FLU/RSV plus assay is intended as an aid in the diagnosis of influenza from Nasopharyngeal swab specimens and should not be used as a sole basis for treatment. Nasal washings and aspirates are unacceptable for Xpert Xpress SARS-CoV-2/FLU/RSV testing.  Fact Sheet for Patients: EntrepreneurPulse.com.au  Fact Sheet for Healthcare Providers: IncredibleEmployment.be  This test is not yet approved or cleared by the [place] FDA and has been authorized for detection and/or diagnosis of  SARS-CoV-2 by FDA under an Emergency Use Authorization (EUA). This EUA will remain in effect (meaning this test can be used) for the duration of the COVID-19 declaration under Section 564(b)(1) of the Act, 21 U.S.C. section 360bbb-3(b)(1), unless the authorization is terminated or revoked.  Performed at [facility], [address] [place], [place] [address]     Time coordinating discharge: 35 minutes  Signed: Terrilee Croak  [facility] 07/25/2021, 1:59 PM

## 2021-07-25 NOTE — Progress Notes (Signed)
Progress Note  Patient Name: Michael Mack Date of Encounter: 07/25/2021  Primary Cardiologist: Ida Rogue, MD  Subjective   No chest pain.  Denies dyspnea.  Still on O2 @ 3lpm - saturations 88-mid 90's overnight/this AM  For myoview today.  Inpatient Medications    Scheduled Meds:  aspirin EC  81 mg Oral Daily   atorvastatin  10 mg Oral Daily   insulin aspart  0-20 Units Subcutaneous Q4H   insulin glargine-yfgn  15 Units Subcutaneous Daily   lactulose  20 g Oral TID   metoprolol tartrate  12.5 mg Oral BID   rifaximin  550 mg Oral BID   Continuous Infusions:  heparin 1,850 Units/hr (07/25/21 0736)   PRN Meds: acetaminophen, methadone, nitroGLYCERIN, ondansetron (ZOFRAN) IV, oxyCODONE   Vital Signs    Vitals:   07/25/21 0700 07/25/21 0738 07/25/21 0741 07/25/21 0742  BP: 124/70     Pulse: 66 66 65 66  Resp: 15 17 17  (!) 21  Temp:      TempSrc:      SpO2: 93% (!) 88% (!) 88% 90%  Weight:      Height:        Intake/Output Summary (Last 24 hours) at 07/25/2021 0930 Last data filed at 07/25/2021 0736 Gross per 24 hour  Intake 533.44 ml  Output 1550 ml  Net -1016.56 ml   Filed Weights   07/23/21 2308  Weight: 113.4 kg    Physical Exam   GEN: Obese, in no acute distress.  HEENT: Grossly normal.  Neck: Supple, obese, unable to gauge JVP.  No carotid bruits or masses. Cardiac: RRR, no murmurs, rubs, or gallops. No clubbing, cyanosis.  Trace to 1+ bilat lower ext edema.  Radials 2+, DP/PT 2+ and equal bilaterally.  Respiratory:  Respirations regular and unlabored, generally CTA though upper airway wheezing noted anteriorly. GI: Obese, soft, nontender, nondistended, BS + x 4. MS: no deformity or atrophy. Skin: warm and dry, no rash. Neuro:  Strength and sensation are intact. Psych: AAOx3.  Normal affect.  Labs    Chemistry Recent Labs  Lab 07/23/21 2144 07/24/21 0759 07/25/21 0436  NA 140 140 137  K 3.8 4.0 3.8  CL 103 107 106  CO2 27 27 28    GLUCOSE 218* 197* 147*  BUN 19 24* 24*  CREATININE 1.00 0.97 1.01  CALCIUM 9.2 8.9 8.6*  PROT 7.8  --   --   ALBUMIN 3.9  --   --   AST 39  --   --   ALT 20  --   --   ALKPHOS 61  --   --   BILITOT 0.7  --   --   GFRNONAA >60 >60 >60  ANIONGAP 10 6 3*     Hematology Recent Labs  Lab 07/23/21 2144 07/24/21 0759 07/25/21 0436  WBC 8.3 5.8 6.1  RBC 4.87 4.02* 4.20*  HGB 15.0 12.5* 12.8*  HCT 44.3 36.6* 38.3*  MCV 91.0 91.0 91.2  MCH 30.8 31.1 30.5  MCHC 33.9 34.2 33.4  RDW 13.2 13.3 13.6  PLT 104* 86* 102*    Cardiac Enzymes  Recent Labs  Lab 07/23/21 2144 07/24/21 0048 07/24/21 0759  TROPONINIHS 58* 708* 1,611*      BNP Recent Labs  Lab 07/23/21 2144  BNP 29.9     Lipids  Lab Results  Component Value Date   CHOL 129 07/24/2021   HDL 43 07/24/2021   LDLCALC 73 07/24/2021   TRIG 64 07/24/2021  CHOLHDL 3.0 07/24/2021    HbA1c  Lab Results  Component Value Date   HGBA1C 7.6 (H) 06/25/2021    Radiology    CT HEAD WO CONTRAST (5MM)  Result Date: 07/24/2021 CLINICAL DATA:  Altered mental status EXAM: CT HEAD WITHOUT CONTRAST TECHNIQUE: Contiguous axial images were obtained from the base of the skull through the vertex without intravenous contrast. RADIATION DOSE REDUCTION: This exam was performed according to the departmental dose-optimization program which includes automated exposure control, adjustment of the mA and/or kV according to patient size and/or use of iterative reconstruction technique. COMPARISON:  CT head dated 12/02/2019 FINDINGS: Brain: No evidence of acute infarction, hemorrhage, hydrocephalus, extra-axial collection or mass lesion/mass effect. Mild subcortical white matter and periventricular small vessel ischemic changes. Vascular: Mild intracranial atherosclerosis. Skull: Normal. Negative for fracture or focal lesion. Sinuses/Orbits: Mild partial opacification of the right maxillary sinus. Visualized paranasal sinuses and mastoid air  cells are otherwise clear. Other: None. IMPRESSION: No evidence of acute intracranial abnormality. Mild small vessel ischemic changes. Electronically Signed   By: Julian Hy M.D.   On: 07/24/2021 00:21   CT Angio Chest PE W and/or Wo Contrast  Result Date: 07/24/2021 CLINICAL DATA:  Found face down in pond with shortness of breath, initial encounter EXAM: CT ANGIOGRAPHY CHEST CT ABDOMEN AND PELVIS WITH CONTRAST TECHNIQUE: Multidetector CT imaging of the chest was performed using the standard protocol during bolus administration of intravenous contrast. Multiplanar CT image reconstructions and MIPs were obtained to evaluate the vascular anatomy. Multidetector CT imaging of the abdomen and pelvis was performed using the standard protocol during bolus administration of intravenous contrast. RADIATION DOSE REDUCTION: This exam was performed according to the departmental dose-optimization program which includes automated exposure control, adjustment of the mA and/or kV according to patient size and/or use of iterative reconstruction technique. CONTRAST:  135mL OMNIPAQUE IOHEXOL 350 MG/ML SOLN COMPARISON:: COMPARISON: Chest x-ray from earlier in the same day, CT from 11/12/2009. FINDINGS: CTA CHEST FINDINGS Cardiovascular: Thoracic aorta shows atherosclerotic calcifications without aneurysmal dilatation or dissection. No cardiac enlargement is noted. No pericardial effusion is seen. No coronary calcifications are noted. The pulmonary artery shows a normal branching pattern without definitive filling defect to suggest pulmonary embolism. Mediastinum/Nodes: Thoracic inlet is within normal limits. No sizable hilar or mediastinal adenopathy is noted. The esophagus is within normal limits. Lungs/Pleura: Bibasilar atelectatic changes are noted slightly worse on the right than the left. No sizable effusion is seen. Musculoskeletal: Mild degenerative change of the thoracic spine is noted. No rib abnormality is noted.  Review of the MIP images confirms the above findings. CT ABDOMEN and PELVIS FINDINGS Hepatobiliary: Liver demonstrates linear metallic densities throughout the right lobe consistent with prior fiducial marking. A vague area of decreased attenuation is noted in the dome of the liver just beneath the diaphragm but incompletely evaluated on this exam. Cirrhotic changes of liver are noted. The gallbladder is within normal limits. Pancreas: Unremarkable. No pancreatic ductal dilatation or surrounding inflammatory changes. Spleen: Spleen is prominent. Multiple splenic varices are identified increased in size when compared with the prior exam from 2011. 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 No significant esophageal or gastric varices are seen. Adrenals/Urinary Tract: Adrenal glands are unremarkable. Kidneys demonstrate a normal enhancement pattern. No renal calculi or obstructive changes are noted. Normal excretion is noted on delayed images. The bladder is well distended. Stomach/Bowel: Mild diverticular change of the colon is noted without evidence of diverticulitis. No obstructive or inflammatory changes are seen.  The appendix is within normal limits. No small bowel abnormality is noted. Stomach is within normal limits. Vascular/Lymphatic: Aortic atherosclerosis. Periportal and gastrohepatic lymph nodes are noted. The largest of these is seen on image number 29 of series 3 measuring up to 13 mm in short axis. No other significant lymphadenopathy is noted. Reproductive: Prostate is unremarkable. Other: No abdominal wall hernia or abnormality. No abdominopelvic ascites. Musculoskeletal: No acute or significant osseous findings. Review of the MIP images confirms the above findings. IMPRESSION: CTA of the chest: No evidence of pulmonary emboli. Mild bibasilar atelectasis right greater than left. 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 A vague area of decreased  attenuation is noted near the dome of the liver which may be related to the prior ablation treatment. This is incompletely evaluated on this exam. Diverticulosis without diverticulitis. Changes consistent with portal hypertension with multiple splenic varices and spontaneous left splenorenal shunt. Stable appearing periportal and gastrohepatic lymph nodes when compared with report from an MRI of 07/23/2021 Electronically Signed   By: Inez Catalina M.D.   On: 07/24/2021 00:24   CT ABDOMEN PELVIS W CONTRAST  Result Date: 07/24/2021 CLINICAL DATA:  Found face down in pond with shortness of breath, initial encounter EXAM: CT ANGIOGRAPHY CHEST CT ABDOMEN AND PELVIS WITH CONTRAST TECHNIQUE: Multidetector CT imaging of the chest was performed using the standard protocol during bolus administration of intravenous contrast. Multiplanar CT image reconstructions and MIPs were obtained to evaluate the vascular anatomy. Multidetector CT imaging of the abdomen and pelvis was performed using the standard protocol during bolus administration of intravenous contrast. RADIATION DOSE REDUCTION: This exam was performed according to the departmental dose-optimization program which includes automated exposure control, adjustment of the mA and/or kV according to patient size and/or use of iterative reconstruction technique. CONTRAST:  110mL OMNIPAQUE IOHEXOL 350 MG/ML SOLN COMPARISON:: COMPARISON: Chest x-ray from earlier in the same day, CT from 11/12/2009. FINDINGS: CTA CHEST FINDINGS Cardiovascular: Thoracic aorta shows atherosclerotic calcifications without aneurysmal dilatation or dissection. No cardiac enlargement is noted. No pericardial effusion is seen. No coronary calcifications are noted. The pulmonary artery shows a normal branching pattern without definitive filling defect to suggest pulmonary embolism. Mediastinum/Nodes: Thoracic inlet is within normal limits. No sizable hilar or mediastinal adenopathy is noted. The  esophagus is within normal limits. Lungs/Pleura: Bibasilar atelectatic changes are noted slightly worse on the right than the left. No sizable effusion is seen. Musculoskeletal: Mild degenerative change of the thoracic spine is noted. No rib abnormality is noted. Review of the MIP images confirms the above findings. CT ABDOMEN and PELVIS FINDINGS Hepatobiliary: Liver demonstrates linear metallic densities throughout the right lobe consistent with prior fiducial marking. A vague area of decreased attenuation is noted in the dome of the liver just beneath the diaphragm but incompletely evaluated on this exam. Cirrhotic changes of liver are noted. The gallbladder is within normal limits. Pancreas: Unremarkable. No pancreatic ductal dilatation or surrounding inflammatory changes. Spleen: Spleen is prominent. Multiple splenic varices are identified increased in size when compared with the prior exam from 2011. 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 No significant esophageal or gastric varices are seen. Adrenals/Urinary Tract: Adrenal glands are unremarkable. Kidneys demonstrate a normal enhancement pattern. No renal calculi or obstructive changes are noted. Normal excretion is noted on delayed images. The bladder is well distended. Stomach/Bowel: Mild diverticular change of the colon is noted without evidence of diverticulitis. No obstructive or inflammatory changes are seen.  The appendix is within normal limits. No small bowel abnormality is noted. Stomach is within normal limits. Vascular/Lymphatic: Aortic atherosclerosis. Periportal and gastrohepatic lymph nodes are noted. The largest of these is seen on image number 29 of series 3 measuring up to 13 mm in short axis. No other significant lymphadenopathy is noted. Reproductive: Prostate is unremarkable. Other: No abdominal wall hernia or abnormality. No abdominopelvic ascites. Musculoskeletal: No acute or significant osseous findings. Review of the MIP  images confirms the above findings. IMPRESSION: CTA of the chest: No evidence of pulmonary emboli. Mild bibasilar atelectasis right greater than left. 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 A vague area of decreased attenuation is noted near the dome of the liver which may be related to the prior ablation treatment. This is incompletely evaluated on this exam. Diverticulosis without diverticulitis. Changes consistent with portal hypertension with multiple splenic varices and spontaneous left splenorenal shunt. Stable appearing periportal and gastrohepatic lymph nodes when compared with report from an MRI of 07/23/2021 Electronically Signed   By: Inez Catalina M.D.   On: 07/24/2021 00:24   DG Chest Portable 1 View  Result Date: 07/23/2021 CLINICAL DATA:  Shortness of breath EXAM: PORTABLE CHEST 1 VIEW COMPARISON:  12/03/2019 FINDINGS: Eventration of the right hemidiaphragm. Mild right basilar atelectasis. Increased tissue markings without frank interstitial edema. No pleural effusion or pneumothorax. The heart is normal in size.  Thoracic aortic atherosclerosis. IMPRESSION: Eventration of the right hemidiaphragm with mild right basilar atelectasis. Electronically Signed   By: Julian Hy M.D.   On: 07/23/2021 22:25   Telemetry    Seen in nuc med - RSR - Personally Reviewed  Cardiac Studies   2D Echocardiogram 2.2.2023  1. Left ventricular ejection fraction, by estimation, is 60 to 65%. The  left ventricle has normal function. The left ventricle has no regional  wall motion abnormalities. There is mild left ventricular hypertrophy.  Left ventricular diastolic parameters  are indeterminate.   2. Right ventricular systolic function is normal. The right ventricular  size is mildly enlarged. Tricuspid regurgitation signal is inadequate for  assessing PA pressure.   3. The mitral valve is normal in structure. Mild mitral valve  regurgitation. No evidence  of mitral stenosis.   4. The aortic valve is normal in structure. Aortic valve regurgitation is  not visualized. Aortic valve sclerosis/calcification is present, without  any evidence of aortic stenosis.   Patient Profile      61 y.o. male with a history of obesity, HTN, HL, DMII, hepatocellular carcinoma, cirrhosis, hepatic encephalopathy, chronic pain, Hep C, headaches, and anxiety, who presented to the ED on 2/1 after falling in the mud @ home, having difficulty getting up, and being found hypoxic in the 80's.  HsTrops elevated @ 58  708  1611.  Assessment & Plan    1.  Demand Ischemia vs NSTEMI:   Pt w/o prior h/o CAD.  In setting of above, HsTrop has elevated from 58  700  1611.  Echo 2/2 w/ nl EF w/o wall motion abnormalities or significant valvular dzs.  Suspect demand ischemia in setting of sinus tachycardia, hypoxia.  He denies chest pain or dyspnea this AM.  On O2 via [place] w/ stable O2 sats.  Plan for myoview this AM.  Cont asa, statin, ? blocker.  Pending MV, will consider d/c heparin.   2.  Acute hypoxic resp failure:  Presented 2/1  after being noted to be more short of breath  by wife and radiology staff earlier in the day, and subsequently found to be hypoxic following fall in the mud, after which, he struggled and then was ultimately unable to get up b/c of the mud.  CTA chest neg for PE.  Mild R>L bibasilar atx noted.  Initially req BiPAP, now on [place] @ 3lpm w/ sats in the high 80's to mid 90's overnight/this AM.  Denies dyspnea.  Upper airway wheezing noted, but CTA posteriorly.  Echo w/ nl LV EF and RV fxn.  Suspect some degree of OHS.  Admits to snoring - sleeps in recliner @ home.  Will need outpt sleep study.  With upper airway wheeze, will add prn duoneb.  3.  Essential HTN:  stable on ? blocker.  4.  AMS:  AAOx 3 this AM.  Received lactulose for ammonia of 107 yesterday.  54 this AM.  5.  DMII:  Per IM.  6.  Chronic pain:  Per IM.  On oxycodone and methadone @ home - ? Role in  2/1 events.  7.  HCC/Hepatic cirrhosis:  followed @ [facility] w/ MRI 2/1  no definitive new findings.  Lactulose per IM.  8.  Thrombocytopenia:  stable this AM. Plan to d/c heparin pending myoview results.  Signed, Murray Hodgkins, NP  07/25/2021, 9:30 AM    For questions or updates, please contact   Please consult [email or web address] for contact info under Cardiology/STEMI.

## 2021-07-29 ENCOUNTER — Other Ambulatory Visit: Payer: Self-pay

## 2021-07-30 ENCOUNTER — Other Ambulatory Visit: Payer: Self-pay

## 2021-07-30 ENCOUNTER — Encounter: Payer: Self-pay | Admitting: Internal Medicine

## 2021-07-30 ENCOUNTER — Other Ambulatory Visit: Payer: Self-pay | Admitting: Gerontology

## 2021-07-30 ENCOUNTER — Ambulatory Visit: Payer: Self-pay | Admitting: Internal Medicine

## 2021-07-30 VITALS — BP 173/81 | HR 82 | Temp 98.2°F | Ht 67.0 in | Wt 259.4 lb

## 2021-07-30 DIAGNOSIS — E119 Type 2 diabetes mellitus without complications: Secondary | ICD-10-CM

## 2021-07-30 DIAGNOSIS — I10 Essential (primary) hypertension: Secondary | ICD-10-CM

## 2021-07-30 DIAGNOSIS — Z794 Long term (current) use of insulin: Secondary | ICD-10-CM

## 2021-07-30 MED ORDER — INSULIN LISPRO (1 UNIT DIAL) 100 UNIT/ML (KWIKPEN)
30.0000 [IU] | PEN_INJECTOR | Freq: Three times a day (TID) | SUBCUTANEOUS | 3 refills | Status: DC
  Filled 2021-07-30: qty 30, 34d supply, fill #0
  Filled 2021-09-24: qty 30, 34d supply, fill #1

## 2021-07-30 MED ORDER — LANTUS SOLOSTAR 100 UNIT/ML ~~LOC~~ SOPN: 50.0000 [IU] | PEN_INJECTOR | Freq: Every day | SUBCUTANEOUS | 0 refills | Status: DC

## 2021-07-30 MED ORDER — APIDRA SOLOSTAR 100 UNIT/ML ~~LOC~~ SOPN
30.0000 [IU] | PEN_INJECTOR | Freq: Three times a day (TID) | SUBCUTANEOUS | 3 refills | Status: DC
  Filled 2021-07-30: qty 27, 30d supply, fill #0
  Filled 2021-08-21 – 2021-08-26 (×2): qty 15, 17d supply, fill #0
  Filled 2021-10-09 – 2021-10-21 (×2): qty 75, 84d supply, fill #1

## 2021-07-30 NOTE — Addendum Note (Signed)
Addended by: Zadie Cleverly on: 07/30/2021 10:39 AM   Modules accepted: Orders

## 2021-07-30 NOTE — Progress Notes (Signed)
Established Patient Office Visit  Subjective:  Patient ID: Michael Mack, male    DOB: [birth year]  Age: 61 y.o. MRN: [id number]  CC:  Chief Complaint  Patient presents with   Medication Problem    Pt is here for medication review to ensure he is taking medications correctly   Follow-up    Pt was seen in ED early February. Follow-up from this visit    HPI Michael Mack presents for medication review.   Patient recently hospitalized. See discharge summary.   Pt recently spoke with medication management about his insulin and appears to be having inadequate amount of Apidra on hand and significant extra Lantus. Their concern is he may be confusing the two medications. This is the second time he has had to brdige with umalog until his Apidra comes in.   Patient appeared to be quite alert and appeared to understand which insulin he was supposed to use and when to use it. He was reeducated to be reminded his Apidra is 3x daily with meals while the Lantus is 50 units at night.   Pt reports taking both medications as directed. He states he takes  83m of his Lantus at night. He checks his sugars 3-4 times a day. Mornings, lunch time, and before bedtime. Gets readings 130-140 in the mornings.   Pt reports taking Apidra 35 units 3x daily with meals.   [facility] Uses [place] location for family practice, encouraged patient to schedule a visit with his NP and review findings from his ED visit, including possibly scheduling a sleep study.   Past Medical History:  Diagnosis Date   Bulging lumbar disc    Chronic pain    Diabetes mellitus without complication (HCC)    GERD (gastroesophageal reflux disease)    Hepatic encephalopathy    Hepatitis C    Hepatocellular carcinoma (HCC)    Hypertension    Migraines    Morbid obesity (HSunol    Thrombocytopenia (HMedia     Past Surgical History:  Procedure Laterality Date   KNEE SURGERY     Right Knee x 3,  Left Knee x 1    Family History  Problem Relation Age of Onset   COPD Mother    Cancer Mother    Heart attack Father 766   Social History   Socioeconomic History   Marital status: Married    Spouse name: AWilley Due  Number of children: 0   Years of education: Not on file   Highest education level: 8th grade  Occupational History   Occupation: unemployed  Tobacco Use   Smoking status: Never   Smokeless tobacco: Never  Vaping Use   Vaping Use: Never used  Substance and Sexual Activity   Alcohol use: No   Drug use: No   Sexual activity: Yes  Other Topics Concern   Not on file  Social History Narrative   Dental problems- [facility]. Visited [facility] program but was too expensive.   Social Determinants of Health   Financial Resource Strain: Not on file  Food Insecurity: Not on file  Transportation Needs: Not on file  Physical Activity: Not on file  Stress: Not on file  Social Connections: Not on file  Intimate Partner Violence: Not on file    Outpatient Medications Prior to Visit  Medication Sig Dispense Refill   acetaminophen (TYLENOL) 500 MG tablet Take 500 mg  by mouth as needed for headache.     amitriptyline (ELAVIL) 50 MG tablet 100 mg at bedtime.     amphetamine-dextroamphetamine (ADDERALL) 20 MG tablet Take 20 mg by mouth daily.     aspirin 81 MG EC tablet Take 1 tablet (81 mg total) by mouth once daily. Swallow whole. 30 tablet 2   atorvastatin (LIPITOR) 10 MG tablet Take 1 tablet (10 mg total) by mouth once daily. 30 tablet 2   Cholecalciferol 25 MCG (1000 UT) tablet Take 1,000 Units by mouth daily.     insulin lispro (HUMALOG KWIKPEN) 100 UNIT/ML KwikPen Inject 35 Units into the skin 3 (three) times daily with meals. 15 mL 2   Insulin Pen Needle 32G X 4 MM MISC Use as directed with Insulin 100 each 11   lactulose (CHRONULAC) 10 GM/15ML solution TAKE 30ML BY MOUTH 3 TIMES DAILY. 2700 mL 11   LANTUS  SOLOSTAR 100 UNIT/ML Solostar Pen Inject 15 Units into the skin daily. 45 mL 0   methadone (DOLOPHINE) 10 MG tablet 30 mg every 8 (eight) hours as needed for moderate pain or severe pain.     metoprolol tartrate (LOPRESSOR) 25 MG tablet Take 0.5 tablets (12.5 mg total) by mouth 2 (two) times daily. 30 tablet 2   multivitamin-iron-minerals-folic acid (CENTRUM) chewable tablet Chew 1 tablet by mouth daily.     naloxone (NARCAN) nasal spray 4 mg/0.1 mL Place into the nose.     nitroGLYCERIN (NITROSTAT) 0.4 MG SL tablet Place 1 tablet (0.4 mg total) under the tongue every 5 (five) minutes x 3 doses as needed for up to 3 days for chest pain. 6 tablet 12   Oxycodone HCl 20 MG TABS Take 1 tablet by mouth every 4 (four) hours as needed. Prescribed by pain management     XIFAXAN 550 MG TABS tablet Take 1 tablet (550 mg total) by mouth 2 (two) times daily. 180 tablet 3   No facility-administered medications prior to visit.    No Known Allergies  ROS Review of Systems    Objective:    Physical Exam  BP (!) 173/81 (BP Location: Left Arm, Patient Position: Sitting, Cuff Size: Large)    Pulse 82    Temp 98.2 F (36.8 C)    Ht '5\' 7"'  (1.702 m)    Wt 259 lb 6.4 oz (117.7 kg)    SpO2 (!) 88%    BMI 40.63 kg/m  Wt Readings from Last 3 Encounters:  07/30/21 259 lb 6.4 oz (117.7 kg)  07/23/21 250 lb (113.4 kg)  07/02/21 270 lb 4.8 oz (122.6 kg)     Health Maintenance Due  Topic Date Due   FOOT EXAM  Never done   Zoster Vaccines- Shingrix (1 of 2) Never done   COLONOSCOPY (Pts 45-53yr Insurance coverage will need to be confirmed)  Never done   URINE MICROALBUMIN  05/13/2019   COVID-19 Vaccine (4 - Booster) 08/23/2020   INFLUENZA VACCINE  01/20/2021    There are no preventive care reminders to display for this patient.  Lab Results  Component Value Date   TSH 3.400 06/25/2021   Lab Results  Component Value Date   WBC 6.1 07/25/2021   HGB 12.8 (L) 07/25/2021   HCT 38.3 (L) 07/25/2021    MCV 91.2 07/25/2021   PLT 102 (L) 07/25/2021   Lab Results  Component Value Date   NA 137 07/25/2021   K 3.8 07/25/2021   CO2 28 07/25/2021   GLUCOSE 147 (H) 07/25/2021  BUN 24 (H) 07/25/2021   CREATININE 1.01 07/25/2021   BILITOT 0.7 07/23/2021   ALKPHOS 61 07/23/2021   AST 39 07/23/2021   ALT 20 07/23/2021   PROT 7.8 07/23/2021   ALBUMIN 3.9 07/23/2021   CALCIUM 8.6 (L) 07/25/2021   ANIONGAP 3 (L) 07/25/2021   EGFR 96 06/25/2021   Lab Results  Component Value Date   CHOL 129 07/24/2021   Lab Results  Component Value Date   HDL 43 07/24/2021   Lab Results  Component Value Date   LDLCALC 73 07/24/2021   Lab Results  Component Value Date   TRIG 64 07/24/2021   Lab Results  Component Value Date   CHOLHDL 3.0 07/24/2021   Lab Results  Component Value Date   HGBA1C 7.6 (H) 06/25/2021      Assessment & Plan:   Problem List Items Addressed This Visit   None   1. Primary hypertension  Blood pressure today appears therepuetic. Was recently hospitalized for hypothermia and semi comatosed state after falling in his yard. Extensive workup, including cardiology review, found he had good cardiac functions., He was given a follow-up with a local cardiologst. His blood pressure medications were adjusted at that time with discontnuation of his lisinopril and hydrochlorothiazide. Replaced with metaprolol 12.70m  2x daily.   2. Type 2 diabetes mellitus without complication, with long-term current use of insulin (HCC)  Pt checks his sugars frequently 3- times a day. Did self demonstration for me in office at 10:30AM with a sugar of 130. Demonstrated good efficiency and dexterity in doing the test. Has been using Lantus 50 units at night and Apidra 35 units tid w meals. Reduce to 30 units tid with meals.    He was given new medication of atorvastatin 189mand asprin 8169mThe ED chart note suggests he was discharged on Lasix 108m108mily. He was asked to lower his lantus to  15 units daily. He did not understand this and has been using 50 units Lantus at night. No coment was made about his Apridra insulin. The discharge records show he was to continue Lisinopril daily but he was under the impression not to take that and has not been doing so. No prescription of Lasix was given to the patient, although he was given nitroglycerin as needed for chest pain. He has not had to use this  At this time I plan to discontinue the metaproterenol 12.5mg 84m the atorvastatin 10mg 64me he has has fairly low LDLs. Because of his varicosities will not continue low dose Asprin at this time. After the cardiology review it may be necessary to restart some or all of these medications. As of today his insulin therapy will be Lantus 50 units at night and Adpidra 30 units tid with meals.   [facility] 08/12/2021 at 7:30pm. They will review and discuss current diabetic treatment plan and adjust as they see fit.   Patient was advised to not administer insulin if he ever gets a reading below 100.   No orders of the defined types were placed in this encounter.   Follow-up: Follow-up with endocrinology 08/12/2021. Labs and [facility] follow-up early March.    GracieDede Query

## 2021-07-31 ENCOUNTER — Other Ambulatory Visit: Payer: Self-pay

## 2021-08-01 ENCOUNTER — Other Ambulatory Visit: Payer: Self-pay

## 2021-08-06 ENCOUNTER — Other Ambulatory Visit: Payer: Self-pay

## 2021-08-08 ENCOUNTER — Telehealth: Payer: Self-pay | Admitting: Pharmacist

## 2021-08-08 NOTE — Telephone Encounter (Signed)
/  17/2023 10:42:21 AM - Apidra & Lantus forms to pt & dr -- Arletha Pili - Friday, August 08, 2021 10:37 AM --  [employer] renewal for Apidra  & Lantus. Forms mailed to pt. for signature, also mailed recertification app requesting POI & taxes. Forms for dr to sign in [facility] folder.

## 2021-08-12 ENCOUNTER — Other Ambulatory Visit: Payer: Self-pay

## 2021-08-12 ENCOUNTER — Ambulatory Visit: Payer: Self-pay | Admitting: "Endocrinology

## 2021-08-12 VITALS — BP 153/77 | HR 79 | Wt 249.7 lb

## 2021-08-12 DIAGNOSIS — E119 Type 2 diabetes mellitus without complications: Secondary | ICD-10-CM

## 2021-08-12 DIAGNOSIS — Z794 Long term (current) use of insulin: Secondary | ICD-10-CM

## 2021-08-12 NOTE — Patient Instructions (Signed)
It was a pleasure to meet you tonight.  -Take 30 units of insulin glulisine -Apidra with meals. But if you are not eating a full meal, take 20 units of Apidra.

## 2021-08-13 ENCOUNTER — Other Ambulatory Visit: Payer: Self-pay

## 2021-08-13 ENCOUNTER — Other Ambulatory Visit: Payer: Self-pay | Admitting: Emergency Medicine

## 2021-08-13 ENCOUNTER — Telehealth: Payer: Self-pay | Admitting: Pharmacist

## 2021-08-13 DIAGNOSIS — Z794 Long term (current) use of insulin: Secondary | ICD-10-CM

## 2021-08-13 DIAGNOSIS — E119 Type 2 diabetes mellitus without complications: Secondary | ICD-10-CM

## 2021-08-13 DIAGNOSIS — I10 Essential (primary) hypertension: Secondary | ICD-10-CM

## 2021-08-13 NOTE — Progress Notes (Unsigned)
Follow up Diabetes/ Endocrine [facility]     Patient ID: Michael Mack, Mack   DOB: Jul 03, 1960, 61 y.o.   MRN: [id number] Assessment:  Michael Mack is a 61 y.o. Mack who is seen in follow up for Type 2 diabetes mellitus without complication, with long-term current use of insulin (Michael Mack) [E11.9, Z79.4] at the request of Michael Millers, MD.  Encounter Diagnoses 1. Type 2 diabetes mellitus without complication, with long-term current use of insulin Michael Mack)     Assessment  Patient is a 61 year old Mack with T2DM, HTN, hepatic encephalopathy, and other comorbidities who is currently not at goal with treatment but is improving (A1c 7.6 on 01.04 from 8.4 on 06/27/2020).  Plan:     -Maintain the same drug regimen (insulin glulisine -Apidra 30 units TID and insulin glargine -Lantus 50 units daily)  Patient was instructed to take Apidra 30 units with full meals but 20 units with small meals and snacks. This would prevent recurrent episodes of hypoglycemia.    Patient Instructions  It was a pleasure to meet you tonight.  -Take 30 units of insulin glulisine -Apidra with meals. But if you are not eating a full meal, take 20 units of Apidra.     No orders of the defined types were placed in this encounter.    Subjective:  Patient is a 61 year old Mack with an 8-year history of T2DM and history of HTN, GERD, chronic pain, hepatic encephalopathy, hepatitis C, hepatocellular carcinoma, migraines, obesity, and thrombocytopenia. He presents for a routine DM visit upon referral by his PCP.  Patient measures his BG 3-4x/day, with fasting glucose around 120. Based on his glucometer, his BG ranges from 50 to 440s. He currently manages his DM with insulin glulisine -Apidra 30 units TID with meals and insulin glargine -Lantus 50 units daily. It was noted that he often runs out of Apidra and needs bridging with Humalog; however, he demonstrated a clear understanding of how to administer the correct dose of  his long- and short-acting insulin.  Patient denies symptoms of hyperglycemia and peripheral neuropathy but does report hypoglycemia episodes (shakiness) about 2x/month.  He states that his recent eye exam was 6 months ago and foot exam within the past year.  Patient was recently hospitalized for hepatic encephalopathy but states feeling much better today.  Patient describes his diet as as cereal, biscuit (breakfast), canned soup and sandwich (lunch), meat and vegetables (dinner); he reports drinking diet/sugar-free drinks. He denies actively exercising due to "bad knees." He also denies smoking, using recreational drug, and consuming alcohol.    Review of Systems  Endocrine: Negative for polydipsia and polyuria.   Michael Mack  has a past medical history of Bulging lumbar disc, Chronic pain, Diabetes mellitus without complication (Michael Mack), GERD (gastroesophageal reflux disease), Hepatic encephalopathy, Hepatitis C, Hepatocellular carcinoma (Michael Mack), Hypertension, Migraines, Morbid obesity (Michael Mack), and Thrombocytopenia (Michael Mack).  Family History, Social History, current Medications and allergies reviewed and updated in Epic.  Objective:    Blood pressure (!) 153/77, pulse 79, weight 249 lb 11.2 oz (113.3 kg), SpO2 94 %. Physical Exam Constitutional:      Appearance: Normal appearance.  Pulmonary:     Effort: Pulmonary effort is normal.  Musculoskeletal:        General: No swelling.        Data : I have personally reviewed pertinent labs and imaging studies, if indicated,  with the patient in clinic today.   Lab Orders  No laboratory test(s) ordered  today    HC Readings from Last 3 Encounters:  No data found for [facility]    Wt Readings from Last 3 Encounters:  08/12/21 249 lb 11.2 oz (113.3 kg)  07/30/21 259 lb 6.4 oz (117.7 kg)  07/23/21 250 lb (113.4 kg)      New Diabetes Consultation [facility]     Patient ID: Michael Mack   DOB: Dec 06, 1960, 61 y.o.   MRN:  [id number] Assessment:  Michael Mack is a 61 y.o. Mack who is seen in consultation for Type 2 diabetes mellitus without complication, with long-term current use of insulin ([place]) [E11.9, Z79.4] at the request of Michael Millers, MD.  1. Type 2 diabetes mellitus without complication, with long-term current use of insulin [facility])     Endocrine Narrative Assessment:   Plan:     Patient Instructions  It was a pleasure to meet you tonight.  -Take 30 units of insulin glulisine -Apidra with meals. But if you are not eating a full meal, take 20 units of Apidra.     No orders of the defined types were placed in this encounter.  Subjective:  Patient is a 61 year-old Mack with T2DM   Review of Systems  Endocrine: Negative for polydipsia and polyuria.       Michael Mack  has a past medical history of Bulging lumbar disc, Chronic pain, Diabetes mellitus without complication (Michael Mack), GERD (gastroesophageal reflux disease), Hepatic encephalopathy, Hepatitis C, Hepatocellular carcinoma (Michael Mack), Hypertension, Migraines, Morbid obesity (Michael Mack), and Thrombocytopenia (Michael Mack).  Michael Mack family history includes COPD in his mother; Cancer in his mother; Heart attack (age of onset: 36) in his father.  Michael Mack  reports that he has never smoked. He has never used smokeless tobacco. He reports that he does not drink alcohol and does not use drugs.   Current Outpatient Medications:    acetaminophen (TYLENOL) 500 MG tablet, Take 500 mg by mouth as needed for headache. As needed, Disp: , Rfl:    amphetamine-dextroamphetamine (ADDERALL) 20 MG tablet, Take 20 mg by mouth daily., Disp: , Rfl:    Beta Carotene (VITAMIN A) 25000 UNIT capsule, Take 25,000 Units by mouth daily., Disp: , Rfl:    hydrochlorothiazide (HYDRODIURIL) 25 MG tablet, Take 25 mg by mouth daily., Disp: , Rfl:    insulin glulisine (APIDRA SOLOSTAR) 100 UNIT/ML Solostar Pen, Inject 30 Units into the skin 3 (three) times daily before  meals., Disp: 27 mL, Rfl: 3   insulin lispro (HUMALOG KWIKPEN) 100 UNIT/ML KwikPen, Inject 30 Units into the skin 3 (three) times daily before meals. (Patient taking differently: Inject 30 Units into the skin 3 (three) times daily before meals. Taking to bridge until Apidra available), Disp: 15 mL, Rfl: 3   Insulin Pen Needle 32G X 4 MM MISC, Use as directed with Insulin, Disp: 100 each, Rfl: 11   lactulose (CHRONULAC) 10 GM/15ML solution, TAKE 30ML BY MOUTH 3 TIMES DAILY., Disp: 2700 mL, Rfl: 11   LANTUS SOLOSTAR 100 UNIT/ML Solostar Pen, Inject 50 Units into the skin daily., Disp: 45 mL, Rfl: 0   lisinopril (ZESTRIL) 40 MG tablet, Take 40 mg by mouth daily., Disp: , Rfl:    methadone (DOLOPHINE) 10 MG tablet, 30 mg every 8 (eight) hours as needed for moderate pain or severe pain., Disp: , Rfl:    multivitamin-iron-minerals-folic acid (CENTRUM) chewable tablet, Chew 1 tablet by mouth daily., Disp: , Rfl:    naloxone (NARCAN) nasal spray 4  mg/0.1 mL, Place into the nose., Disp: , Rfl:    nitroGLYCERIN (NITROSTAT) 0.4 MG SL tablet, Place 1 tablet (0.4 mg total) under the tongue every 5 (five) minutes x 3 doses as needed for up to 3 days for chest pain., Disp: 6 tablet, Rfl: 12   Oxycodone HCl 20 MG TABS, Take 1 tablet by mouth every 4 (four) hours as needed. Prescribed by pain management, Disp: , Rfl:    XIFAXAN 550 MG TABS tablet, Take 1 tablet (550 mg total) by mouth 2 (two) times daily., Disp: 180 tablet, Rfl: 3   amitriptyline (ELAVIL) 50 MG tablet, 100 mg at bedtime. (Patient not taking: Reported on 08/12/2021), Disp: , Rfl:    Cholecalciferol 25 MCG (1000 UT) tablet, Take 1,000 Units by mouth daily. (Patient not taking: Reported on 08/12/2021), Disp: , Rfl:   No Known Allergies Objective:    Physical Exam Constitutional:      Appearance: Normal appearance.  Pulmonary:     Effort: Pulmonary effort is normal.  Musculoskeletal:        General: No swelling.  No asterixis       Data/Results/Labs  : I have personally reviewed pertinent labs and imaging studies, if indicated,  with the patient in clinic today.  Results for orders placed or performed during the hospital encounter of 07/23/21 (from the past 672 hour(s))  CBG monitoring, ED   Collection Time: 07/23/21  9:43 PM  Result Value Ref Range   Glucose-Capillary 208 (H) 70 - 99 mg/dL  Resp Panel by RT-PCR (Flu A&B, Covid) Nasopharyngeal Swab   Collection Time: 07/23/21  9:44 PM   Specimen: Nasopharyngeal Swab; Nasopharyngeal(NP) swabs in vial transport medium  Result Value Ref Range   SARS Coronavirus 2 by RT PCR NEGATIVE NEGATIVE   Influenza A by PCR NEGATIVE NEGATIVE   Influenza B by PCR NEGATIVE NEGATIVE  CBC with Differential   Collection Time: 07/23/21  9:44 PM  Result Value Ref Range   WBC 8.3 4.0 - 10.5 K/uL   RBC 4.87 4.22 - 5.81 MIL/uL   Hemoglobin 15.0 13.0 - 17.0 g/dL   HCT 44.3 39.0 - 52.0 %   MCV 91.0 80.0 - 100.0 fL   MCH 30.8 26.0 - 34.0 pg   MCHC 33.9 30.0 - 36.0 g/dL   RDW 13.2 11.5 - 15.5 %   Platelets 104 (L) 150 - 400 K/uL   nRBC 0.0 0.0 - 0.2 %   Neutrophils Relative % 72 %   Neutro Abs 6.1 1.7 - 7.7 K/uL   Lymphocytes Relative 17 %   Lymphs Abs 1.4 0.7 - 4.0 K/uL   Monocytes Relative 7 %   Monocytes Absolute 0.5 0.1 - 1.0 K/uL   Eosinophils Relative 2 %   Eosinophils Absolute 0.2 0.0 - 0.5 K/uL   Basophils Relative 1 %   Basophils Absolute 0.0 0.0 - 0.1 K/uL   Immature Granulocytes 1 %   Abs Immature Granulocytes 0.04 0.00 - 0.07 K/uL  Comprehensive metabolic panel   Collection Time: 07/23/21  9:44 PM  Result Value Ref Range   Sodium 140 135 - 145 mmol/L   Potassium 3.8 3.5 - 5.1 mmol/L   Chloride 103 98 - 111 mmol/L   CO2 27 22 - 32 mmol/L   Glucose, Bld 218 (H) 70 - 99 mg/dL   BUN 19 6 - 20 mg/dL   Creatinine, Ser 1.00 0.61 - 1.24 mg/dL   Calcium 9.2 8.9 - 10.3 mg/dL   Total Protein 7.8 6.5 -  8.1 g/dL   Albumin 3.9 3.5 - 5.0 g/dL   AST 39 15 - 41 U/L   ALT  20 0 - 44 U/L   Alkaline Phosphatase 61 38 - 126 U/L   Total Bilirubin 0.7 0.3 - 1.2 mg/dL   GFR, Estimated >60 >60 mL/min   Anion gap 10 5 - 15  Ammonia   Collection Time: 07/23/21  9:44 PM  Result Value Ref Range   Ammonia 107 (H) 9 - 35 umol/L  Brain natriuretic peptide   Collection Time: 07/23/21  9:44 PM  Result Value Ref Range   B Natriuretic Peptide 29.9 0.0 - 100.0 pg/mL  Troponin I (High Sensitivity)   Collection Time: 07/23/21  9:44 PM  Result Value Ref Range   Troponin I (High Sensitivity) 58 (H) <18 ng/L  Blood gas, venous   Collection Time: 07/23/21 10:05 PM  Result Value Ref Range   pH, Ven 7.30 7.250 - 7.430   pCO2, Ven 62 (H) 44.0 - 60.0 mmHg   pO2, Ven 54.0 (H) 32.0 - 45.0 mmHg   Bicarbonate 30.5 (H) 20.0 - 28.0 mmol/L   Acid-Base Excess 2.2 (H) 0.0 - 2.0 mmol/L   O2 Saturation 83.9 %   Patient temperature 37.0    Collection site VEIN    Sample type VENOUS   Troponin I (High Sensitivity)   Collection Time: 07/24/21 12:48 AM  Result Value Ref Range   Troponin I (High Sensitivity) 708 (HH) <18 ng/L  APTT   Collection Time: 07/24/21  1:57 AM  Result Value Ref Range   aPTT 29 24 - 36 seconds  Protime-INR   Collection Time: 07/24/21  1:57 AM  Result Value Ref Range   Prothrombin Time 15.2 11.4 - 15.2 seconds   INR 1.2 0.8 - 1.2  CBG monitoring, ED   Collection Time: 07/24/21  3:27 AM  Result Value Ref Range   Glucose-Capillary 197 (H) 70 - 99 mg/dL  CBC   Collection Time: 07/24/21  7:59 AM  Result Value Ref Range   WBC 5.8 4.0 - 10.5 K/uL   RBC 4.02 (L) 4.22 - 5.81 MIL/uL   Hemoglobin 12.5 (L) 13.0 - 17.0 g/dL   HCT 36.6 (L) 39.0 - 52.0 %   MCV 91.0 80.0 - 100.0 fL   MCH 31.1 26.0 - 34.0 pg   MCHC 34.2 30.0 - 36.0 g/dL   RDW 13.3 11.5 - 15.5 %   Platelets 86 (L) 150 - 400 K/uL   nRBC 0.0 0.0 - 0.2 %  HIV Antibody (routine testing w rflx)   Collection Time: 07/24/21  7:59 AM  Result Value Ref Range   HIV Screen 4th Generation wRfx Non Reactive  Non Reactive  Basic metabolic panel   Collection Time: 07/24/21  7:59 AM  Result Value Ref Range   Sodium 140 135 - 145 mmol/L   Potassium 4.0 3.5 - 5.1 mmol/L   Chloride 107 98 - 111 mmol/L   CO2 27 22 - 32 mmol/L   Glucose, Bld 197 (H) 70 - 99 mg/dL   BUN 24 (H) 6 - 20 mg/dL   Creatinine, Ser 0.97 0.61 - 1.24 mg/dL   Calcium 8.9 8.9 - 10.3 mg/dL   GFR, Estimated >60 >60 mL/min   Anion gap 6 5 - 15  Lipid panel   Collection Time: 07/24/21  7:59 AM  Result Value Ref Range   Cholesterol 129 0 - 200 mg/dL   Triglycerides 64 <150 mg/dL   HDL 43 >40 mg/dL  Total CHOL/HDL Ratio 3.0 RATIO   VLDL 13 0 - 40 mg/dL   LDL Cholesterol 73 0 - 99 mg/dL  Heparin level (unfractionated)   Collection Time: 07/24/21  7:59 AM  Result Value Ref Range   Heparin Unfractionated 0.14 (L) 0.30 - 0.70 IU/mL  Troponin I (High Sensitivity)   Collection Time: 07/24/21  7:59 AM  Result Value Ref Range   Troponin I (High Sensitivity) 1,611 (HH) <18 ng/L  CBG monitoring, ED   Collection Time: 07/24/21  8:04 AM  Result Value Ref Range   Glucose-Capillary 176 (H) 70 - 99 mg/dL  CBG monitoring, ED   Collection Time: 07/24/21 11:39 AM  Result Value Ref Range   Glucose-Capillary 306 (H) 70 - 99 mg/dL  ECHOCARDIOGRAM COMPLETE   Collection Time: 07/24/21  1:19 PM  Result Value Ref Range   Weight 4,000 oz   Height 65 in   BP 119/94 mmHg   S' Lateral 2.30 cm  CBG monitoring, ED   Collection Time: 07/24/21  4:21 PM  Result Value Ref Range   Glucose-Capillary 185 (H) 70 - 99 mg/dL  Heparin level (unfractionated)   Collection Time: 07/24/21  5:48 PM  Result Value Ref Range   Heparin Unfractionated 0.14 (L) 0.30 - 0.70 IU/mL  CBG monitoring, ED   Collection Time: 07/24/21  8:12 PM  Result Value Ref Range   Glucose-Capillary 173 (H) 70 - 99 mg/dL  CBG monitoring, ED   Collection Time: 07/25/21 12:37 AM  Result Value Ref Range   Glucose-Capillary 129 (H) 70 - 99 mg/dL  CBG monitoring, ED   Collection  Time: 07/25/21  4:23 AM  Result Value Ref Range   Glucose-Capillary 143 (H) 70 - 99 mg/dL  CBC with Differential/Platelet   Collection Time: 07/25/21  4:36 AM  Result Value Ref Range   WBC 6.1 4.0 - 10.5 K/uL   RBC 4.20 (L) 4.22 - 5.81 MIL/uL   Hemoglobin 12.8 (L) 13.0 - 17.0 g/dL   HCT 38.3 (L) 39.0 - 52.0 %   MCV 91.2 80.0 - 100.0 fL   MCH 30.5 26.0 - 34.0 pg   MCHC 33.4 30.0 - 36.0 g/dL   RDW 13.6 11.5 - 15.5 %   Platelets 102 (L) 150 - 400 K/uL   nRBC 0.0 0.0 - 0.2 %   Neutrophils Relative % 48 %   Neutro Abs 2.9 1.7 - 7.7 K/uL   Lymphocytes Relative 38 %   Lymphs Abs 2.3 0.7 - 4.0 K/uL   Monocytes Relative 8 %   Monocytes Absolute 0.5 0.1 - 1.0 K/uL   Eosinophils Relative 5 %   Eosinophils Absolute 0.3 0.0 - 0.5 K/uL   Basophils Relative 1 %   Basophils Absolute 0.0 0.0 - 0.1 K/uL   Immature Granulocytes 0 %   Abs Immature Granulocytes 0.01 0.00 - 0.07 K/uL  Basic metabolic panel   Collection Time: 07/25/21  4:36 AM  Result Value Ref Range   Sodium 137 135 - 145 mmol/L   Potassium 3.8 3.5 - 5.1 mmol/L   Chloride 106 98 - 111 mmol/L   CO2 28 22 - 32 mmol/L   Glucose, Bld 147 (H) 70 - 99 mg/dL   BUN 24 (H) 6 - 20 mg/dL   Creatinine, Ser 1.01 0.61 - 1.24 mg/dL   Calcium 8.6 (L) 8.9 - 10.3 mg/dL   GFR, Estimated >60 >60 mL/min   Anion gap 3 (L) 5 - 15  Ammonia   Collection Time: 07/25/21  4:36  AM  Result Value Ref Range   Ammonia 54 (H) 9 - 35 umol/L  Magnesium   Collection Time: 07/25/21  4:36 AM  Result Value Ref Range   Magnesium 2.2 1.7 - 2.4 mg/dL  Phosphorus   Collection Time: 07/25/21  4:36 AM  Result Value Ref Range   Phosphorus 4.0 2.5 - 4.6 mg/dL  Protime-INR   Collection Time: 07/25/21  4:36 AM  Result Value Ref Range   Prothrombin Time 15.6 (H) 11.4 - 15.2 seconds   INR 1.2 0.8 - 1.2  Heparin level (unfractionated)   Collection Time: 07/25/21  4:36 AM  Result Value Ref Range   Heparin Unfractionated 0.43 0.30 - 0.70 IU/mL  CBG monitoring, ED    Collection Time: 07/25/21  8:18 AM  Result Value Ref Range   Glucose-Capillary 114 (H) 70 - 99 mg/dL  NM Myocar Multi W/Spect W/Wall Motion / EF   Collection Time: 07/25/21 11:01 AM  Result Value Ref Range   Rest HR 81.0 bpm   Rest BP 124/70 mmHg   Exercise duration (min) 0 min   Exercise duration (sec) 0 sec   Estimated workload 1.0    Peak HR 86 bpm   Peak BP 127/63 mmHg   MPHR 160 bpm   Percent HR 53.0 %   ST Depression (mm) 0 mm   Rest Nuclear Isotope Dose 10.2 mCi   Stress Nuclear Isotope Dose 31.8 mCi   SSS 0.0    SRS 5.0    SDS 0.0    TID 0.88    LV sys vol 29.0 mL   LV dias vol 118.0 62 - 150 mL   Nuc Stress EF 75 %  CBG monitoring, ED   Collection Time: 07/25/21 12:11 PM  Result Value Ref Range   Glucose-Capillary 188 (H) 70 - 99 mg/dL  ]  HC Readings from Last 3 Encounters:  No data found for [facility]    Wt Readings from Last 3 Encounters:  08/12/21 249 lb 11.2 oz (113.3 kg)  07/30/21 259 lb 6.4 oz (117.7 kg)  07/23/21 250 lb (113.4 kg)

## 2021-08-13 NOTE — Progress Notes (Deleted)
Follow up Diabetes/ Endocrine [facility]     Patient ID: Michael Mack, male   DOB: 1961-04-05, 61 y.o.   MRN: [id number] Assessment:  Michael Mack is a 61 y.o. male who is seen in follow up for Type 2 diabetes mellitus without complication, with long-term current use of insulin ([place]) [E11.9, Z79.4] at the request of Michael Millers, MD.  Encounter Diagnoses 1. Type 2 diabetes mellitus without complication, with long-term current use of insulin Michael Mack)     Assessment  Patient is a 61 year old male with T2DM, HTN, hepatic encephalopathy, and other comorbidities who is currently not at goal with treatment but is improving (A1c 7.6 on 01.04 from 8.4 on 06/27/2020).  Plan:     -Maintain the same drug regimen (insulin glulisine -Apidra 30 units TID and insulin glargine -Lantus 50 units daily)  Patient was instructed to take Apidra 30 units with full meals but 20 units with small meals and snacks. This would prevent recurrent episodes of hypoglycemia.    Patient Instructions  It was a pleasure to meet you tonight.  -Take 30 units of insulin glulisine -Apidra with meals. But if you are not eating a full meal, take 20 units of Apidra.     No orders of the defined types were placed in this encounter.    Subjective:  Patient is a 61 year old male with an 8-year history of T2DM and history of HTN, GERD, chronic pain, hepatic encephalopathy, hepatitis C, hepatocellular carcinoma, migraines, obesity, and thrombocytopenia. He presents for a routine DM visit upon referral by his PCP.  Patient measures his BG 3-4x/day, with fasting glucose around 120. Based on his glucometer, his BG ranges from 50 to 440s. He currently manages his DM with insulin glulisine -Apidra 30 units TID with meals and insulin glargine -Lantus 50 units daily. It was noted that he often runs out of Apidra and needs bridging with Humalog; however, he demonstrated a clear understanding of how to administer the correct dose of  his long- and short-acting insulin.  Patient denies symptoms of hyperglycemia and peripheral neuropathy but does report hypoglycemia episodes (shakiness) about 2x/month.  He states that his recent eye exam was 6 months ago and foot exam within the past year.  Patient was recently hospitalized for hepatic encephalopathy but states feeling much better today.  Patient describes his diet as as cereal, biscuit (breakfast), canned soup and sandwich (lunch), meat and vegetables (dinner); he reports drinking diet/sugar-free drinks. He denies actively exercising due to "bad knees." He also denies smoking, using recreational drug, and consuming alcohol.    Review of Systems  Endocrine: Negative for polydipsia and polyuria.   Michael Mack  has a past medical history of Bulging lumbar disc, Chronic pain, Diabetes mellitus without complication ([place]), GERD (gastroesophageal reflux disease), Hepatic encephalopathy, Hepatitis C, Hepatocellular carcinoma ([place]), Hypertension, Migraines, Morbid obesity (Michael Mack), and Thrombocytopenia ([place]).  Family History, Social History, current Medications and allergies reviewed and updated in Epic.  Objective:    Blood pressure (!) 153/77, pulse 79, weight 249 lb 11.2 oz (113.3 kg), SpO2 94 %. Physical Exam Constitutional:      Appearance: Normal appearance.  Pulmonary:     Effort: Pulmonary effort is normal.  Musculoskeletal:        General: No swelling.        Data : I have personally reviewed pertinent labs and imaging studies, if indicated,  with the patient in clinic today.   Lab Orders  No laboratory test(s) ordered  today    HC Readings from Last 3 Encounters:  No data found for Michael Mack    Wt Readings from Last 3 Encounters:  08/12/21 249 lb 11.2 oz (113.3 kg)  07/30/21 259 lb 6.4 oz (117.7 kg)  07/23/21 250 lb (113.4 kg)      New Diabetes Consultation [facility]     Patient ID: Michael Mack, male   DOB: [birth year], 61 y.o.   MRN:  [id number] Assessment:  Michael Mack is a 61 y.o. male who is seen in consultation for Type 2 diabetes mellitus without complication, with long-term current use of insulin ([place]) [E11.9, Z79.4] at the request of Michael Millers, MD.  1. Type 2 diabetes mellitus without complication, with long-term current use of insulin [name])     Endocrine Narrative Assessment:   Plan:     Patient Instructions  It was a pleasure to meet you tonight.  -Take 30 units of insulin glulisine -Apidra with meals. But if you are not eating a full meal, take 20 units of Apidra.     No orders of the defined types were placed in this encounter.  Subjective:  Patient is a 60 year-old male with T2DM   Review of Systems  Endocrine: Negative for polydipsia and polyuria.       Michael Mack  has a past medical history of Bulging lumbar disc, Chronic pain, Diabetes mellitus without complication (Michael Mack), GERD (gastroesophageal reflux disease), Hepatic encephalopathy, Hepatitis C, Hepatocellular carcinoma ([place]), Hypertension, Migraines, Morbid obesity ([place]), and Thrombocytopenia ([place]).  [name] family history includes COPD in his mother; Cancer in his mother; Heart attack (age of onset: 43) in his father.  Michael Mack  reports that he has never smoked. He has never used smokeless tobacco. He reports that he does not drink alcohol and does not use drugs.   Current Outpatient Medications:    acetaminophen (TYLENOL) 500 MG tablet, Take 500 mg by mouth as needed for headache. As needed, Disp: , Rfl:    amphetamine-dextroamphetamine (ADDERALL) 20 MG tablet, Take 20 mg by mouth daily., Disp: , Rfl:    Beta Carotene (VITAMIN A) 25000 UNIT capsule, Take 25,000 Units by mouth daily., Disp: , Rfl:    hydrochlorothiazide (HYDRODIURIL) 25 MG tablet, Take 25 mg by mouth daily., Disp: , Rfl:    insulin glulisine (APIDRA SOLOSTAR) 100 UNIT/ML Solostar Pen, Inject 30 Units into the skin 3 (three) times daily before  meals., Disp: 27 mL, Rfl: 3   insulin lispro (HUMALOG KWIKPEN) 100 UNIT/ML KwikPen, Inject 30 Units into the skin 3 (three) times daily before meals. (Patient taking differently: Inject 30 Units into the skin 3 (three) times daily before meals. Taking to bridge until Apidra available), Disp: 15 mL, Rfl: 3   Insulin Pen Needle 32G X 4 MM MISC, Use as directed with Insulin, Disp: 100 each, Rfl: 11   lactulose (CHRONULAC) 10 GM/15ML solution, TAKE 30ML BY MOUTH 3 TIMES DAILY., Disp: 2700 mL, Rfl: 11   LANTUS SOLOSTAR 100 UNIT/ML Solostar Pen, Inject 50 Units into the skin daily., Disp: 45 mL, Rfl: 0   lisinopril (ZESTRIL) 40 MG tablet, Take 40 mg by mouth daily., Disp: , Rfl:    methadone (DOLOPHINE) 10 MG tablet, 30 mg every 8 (eight) hours as needed for moderate pain or severe pain., Disp: , Rfl:    multivitamin-iron-minerals-folic acid (CENTRUM) chewable tablet, Chew 1 tablet by mouth daily., Disp: , Rfl:    naloxone (NARCAN) nasal spray 4  mg/0.1 mL, Place into the nose., Disp: , Rfl:    nitroGLYCERIN (NITROSTAT) 0.4 MG SL tablet, Place 1 tablet (0.4 mg total) under the tongue every 5 (five) minutes x 3 doses as needed for up to 3 days for chest pain., Disp: 6 tablet, Rfl: 12   Oxycodone HCl 20 MG TABS, Take 1 tablet by mouth every 4 (four) hours as needed. Prescribed by pain management, Disp: , Rfl:    XIFAXAN 550 MG TABS tablet, Take 1 tablet (550 mg total) by mouth 2 (two) times daily., Disp: 180 tablet, Rfl: 3   amitriptyline (ELAVIL) 50 MG tablet, 100 mg at bedtime. (Patient not taking: Reported on 08/12/2021), Disp: , Rfl:    Cholecalciferol 25 MCG (1000 UT) tablet, Take 1,000 Units by mouth daily. (Patient not taking: Reported on 08/12/2021), Disp: , Rfl:   No Known Allergies Objective:    Physical Exam Constitutional:      Appearance: Normal appearance.  Pulmonary:     Effort: Pulmonary effort is normal.  Musculoskeletal:        General: No swelling.  No asterixis       Data/Results/Labs  : I have personally reviewed pertinent labs and imaging studies, if indicated,  with the patient in clinic today.  Results for orders placed or performed during the hospital encounter of 07/23/21 (from the past 672 hour(s))  CBG monitoring, ED   Collection Time: 07/23/21  9:43 PM  Result Value Ref Range   Glucose-Capillary 208 (H) 70 - 99 mg/dL  Resp Panel by RT-PCR (Flu A&B, Covid) Nasopharyngeal Swab   Collection Time: 07/23/21  9:44 PM   Specimen: Nasopharyngeal Swab; Nasopharyngeal(NP) swabs in vial transport medium  Result Value Ref Range   SARS Coronavirus 2 by RT PCR NEGATIVE NEGATIVE   Influenza A by PCR NEGATIVE NEGATIVE   Influenza B by PCR NEGATIVE NEGATIVE  CBC with Differential   Collection Time: 07/23/21  9:44 PM  Result Value Ref Range   WBC 8.3 4.0 - 10.5 K/uL   RBC 4.87 4.22 - 5.81 MIL/uL   Hemoglobin 15.0 13.0 - 17.0 g/dL   HCT 44.3 39.0 - 52.0 %   MCV 91.0 80.0 - 100.0 fL   MCH 30.8 26.0 - 34.0 pg   MCHC 33.9 30.0 - 36.0 g/dL   RDW 13.2 11.5 - 15.5 %   Platelets 104 (L) 150 - 400 K/uL   nRBC 0.0 0.0 - 0.2 %   Neutrophils Relative % 72 %   Neutro Abs 6.1 1.7 - 7.7 K/uL   Lymphocytes Relative 17 %   Lymphs Abs 1.4 0.7 - 4.0 K/uL   Monocytes Relative 7 %   Monocytes Absolute 0.5 0.1 - 1.0 K/uL   Eosinophils Relative 2 %   Eosinophils Absolute 0.2 0.0 - 0.5 K/uL   Basophils Relative 1 %   Basophils Absolute 0.0 0.0 - 0.1 K/uL   Immature Granulocytes 1 %   Abs Immature Granulocytes 0.04 0.00 - 0.07 K/uL  Comprehensive metabolic panel   Collection Time: 07/23/21  9:44 PM  Result Value Ref Range   Sodium 140 135 - 145 mmol/L   Potassium 3.8 3.5 - 5.1 mmol/L   Chloride 103 98 - 111 mmol/L   CO2 27 22 - 32 mmol/L   Glucose, Bld 218 (H) 70 - 99 mg/dL   BUN 19 6 - 20 mg/dL   Creatinine, Ser 1.00 0.61 - 1.24 mg/dL   Calcium 9.2 8.9 - 10.3 mg/dL   Total Protein 7.8 6.5 -  8.1 g/dL   Albumin 3.9 3.5 - 5.0 g/dL   AST 39 15 - 41 U/L   ALT  20 0 - 44 U/L   Alkaline Phosphatase 61 38 - 126 U/L   Total Bilirubin 0.7 0.3 - 1.2 mg/dL   GFR, Estimated >60 >60 mL/min   Anion gap 10 5 - 15  Ammonia   Collection Time: 07/23/21  9:44 PM  Result Value Ref Range   Ammonia 107 (H) 9 - 35 umol/L  Brain natriuretic peptide   Collection Time: 07/23/21  9:44 PM  Result Value Ref Range   B Natriuretic Peptide 29.9 0.0 - 100.0 pg/mL  Troponin I (High Sensitivity)   Collection Time: 07/23/21  9:44 PM  Result Value Ref Range   Troponin I (High Sensitivity) 58 (H) <18 ng/L  Blood gas, venous   Collection Time: 07/23/21 10:05 PM  Result Value Ref Range   pH, Ven 7.30 7.250 - 7.430   pCO2, Ven 62 (H) 44.0 - 60.0 mmHg   pO2, Ven 54.0 (H) 32.0 - 45.0 mmHg   Bicarbonate 30.5 (H) 20.0 - 28.0 mmol/L   Acid-Base Excess 2.2 (H) 0.0 - 2.0 mmol/L   O2 Saturation 83.9 %   Patient temperature 37.0    Collection site VEIN    Sample type VENOUS   Troponin I (High Sensitivity)   Collection Time: 07/24/21 12:48 AM  Result Value Ref Range   Troponin I (High Sensitivity) 708 (HH) <18 ng/L  APTT   Collection Time: 07/24/21  1:57 AM  Result Value Ref Range   aPTT 29 24 - 36 seconds  Protime-INR   Collection Time: 07/24/21  1:57 AM  Result Value Ref Range   Prothrombin Time 15.2 11.4 - 15.2 seconds   INR 1.2 0.8 - 1.2  CBG monitoring, ED   Collection Time: 07/24/21  3:27 AM  Result Value Ref Range   Glucose-Capillary 197 (H) 70 - 99 mg/dL  CBC   Collection Time: 07/24/21  7:59 AM  Result Value Ref Range   WBC 5.8 4.0 - 10.5 K/uL   RBC 4.02 (L) 4.22 - 5.81 MIL/uL   Hemoglobin 12.5 (L) 13.0 - 17.0 g/dL   HCT 36.6 (L) 39.0 - 52.0 %   MCV 91.0 80.0 - 100.0 fL   MCH 31.1 26.0 - 34.0 pg   MCHC 34.2 30.0 - 36.0 g/dL   RDW 13.3 11.5 - 15.5 %   Platelets 86 (L) 150 - 400 K/uL   nRBC 0.0 0.0 - 0.2 %  HIV Antibody (routine testing w rflx)   Collection Time: 07/24/21  7:59 AM  Result Value Ref Range   HIV Screen 4th Generation wRfx Non Reactive  Non Reactive  Basic metabolic panel   Collection Time: 07/24/21  7:59 AM  Result Value Ref Range   Sodium 140 135 - 145 mmol/L   Potassium 4.0 3.5 - 5.1 mmol/L   Chloride 107 98 - 111 mmol/L   CO2 27 22 - 32 mmol/L   Glucose, Bld 197 (H) 70 - 99 mg/dL   BUN 24 (H) 6 - 20 mg/dL   Creatinine, Ser 0.97 0.61 - 1.24 mg/dL   Calcium 8.9 8.9 - 10.3 mg/dL   GFR, Estimated >60 >60 mL/min   Anion gap 6 5 - 15  Lipid panel   Collection Time: 07/24/21  7:59 AM  Result Value Ref Range   Cholesterol 129 0 - 200 mg/dL   Triglycerides 64 <150 mg/dL   HDL 43 >40 mg/dL  Total CHOL/HDL Ratio 3.0 RATIO   VLDL 13 0 - 40 mg/dL   LDL Cholesterol 73 0 - 99 mg/dL  Heparin level (unfractionated)   Collection Time: 07/24/21  7:59 AM  Result Value Ref Range   Heparin Unfractionated 0.14 (L) 0.30 - 0.70 IU/mL  Troponin I (High Sensitivity)   Collection Time: 07/24/21  7:59 AM  Result Value Ref Range   Troponin I (High Sensitivity) 1,611 (HH) <18 ng/L  CBG monitoring, ED   Collection Time: 07/24/21  8:04 AM  Result Value Ref Range   Glucose-Capillary 176 (H) 70 - 99 mg/dL  CBG monitoring, ED   Collection Time: 07/24/21 11:39 AM  Result Value Ref Range   Glucose-Capillary 306 (H) 70 - 99 mg/dL  ECHOCARDIOGRAM COMPLETE   Collection Time: 07/24/21  1:19 PM  Result Value Ref Range   Weight 4,000 oz   Height 65 in   BP 119/94 mmHg   S' Lateral 2.30 cm  CBG monitoring, ED   Collection Time: 07/24/21  4:21 PM  Result Value Ref Range   Glucose-Capillary 185 (H) 70 - 99 mg/dL  Heparin level (unfractionated)   Collection Time: 07/24/21  5:48 PM  Result Value Ref Range   Heparin Unfractionated 0.14 (L) 0.30 - 0.70 IU/mL  CBG monitoring, ED   Collection Time: 07/24/21  8:12 PM  Result Value Ref Range   Glucose-Capillary 173 (H) 70 - 99 mg/dL  CBG monitoring, ED   Collection Time: 07/25/21 12:37 AM  Result Value Ref Range   Glucose-Capillary 129 (H) 70 - 99 mg/dL  CBG monitoring, ED   Collection  Time: 07/25/21  4:23 AM  Result Value Ref Range   Glucose-Capillary 143 (H) 70 - 99 mg/dL  CBC with Differential/Platelet   Collection Time: 07/25/21  4:36 AM  Result Value Ref Range   WBC 6.1 4.0 - 10.5 K/uL   RBC 4.20 (L) 4.22 - 5.81 MIL/uL   Hemoglobin 12.8 (L) 13.0 - 17.0 g/dL   HCT 38.3 (L) 39.0 - 52.0 %   MCV 91.2 80.0 - 100.0 fL   MCH 30.5 26.0 - 34.0 pg   MCHC 33.4 30.0 - 36.0 g/dL   RDW 13.6 11.5 - 15.5 %   Platelets 102 (L) 150 - 400 K/uL   nRBC 0.0 0.0 - 0.2 %   Neutrophils Relative % 48 %   Neutro Abs 2.9 1.7 - 7.7 K/uL   Lymphocytes Relative 38 %   Lymphs Abs 2.3 0.7 - 4.0 K/uL   Monocytes Relative 8 %   Monocytes Absolute 0.5 0.1 - 1.0 K/uL   Eosinophils Relative 5 %   Eosinophils Absolute 0.3 0.0 - 0.5 K/uL   Basophils Relative 1 %   Basophils Absolute 0.0 0.0 - 0.1 K/uL   Immature Granulocytes 0 %   Abs Immature Granulocytes 0.01 0.00 - 0.07 K/uL  Basic metabolic panel   Collection Time: 07/25/21  4:36 AM  Result Value Ref Range   Sodium 137 135 - 145 mmol/L   Potassium 3.8 3.5 - 5.1 mmol/L   Chloride 106 98 - 111 mmol/L   CO2 28 22 - 32 mmol/L   Glucose, Bld 147 (H) 70 - 99 mg/dL   BUN 24 (H) 6 - 20 mg/dL   Creatinine, Ser 1.01 0.61 - 1.24 mg/dL   Calcium 8.6 (L) 8.9 - 10.3 mg/dL   GFR, Estimated >60 >60 mL/min   Anion gap 3 (L) 5 - 15  Ammonia   Collection Time: 07/25/21  4:36  AM  Result Value Ref Range   Ammonia 54 (H) 9 - 35 umol/L  Magnesium   Collection Time: 07/25/21  4:36 AM  Result Value Ref Range   Magnesium 2.2 1.7 - 2.4 mg/dL  Phosphorus   Collection Time: 07/25/21  4:36 AM  Result Value Ref Range   Phosphorus 4.0 2.5 - 4.6 mg/dL  Protime-INR   Collection Time: 07/25/21  4:36 AM  Result Value Ref Range   Prothrombin Time 15.6 (H) 11.4 - 15.2 seconds   INR 1.2 0.8 - 1.2  Heparin level (unfractionated)   Collection Time: 07/25/21  4:36 AM  Result Value Ref Range   Heparin Unfractionated 0.43 0.30 - 0.70 IU/mL  CBG monitoring, ED    Collection Time: 07/25/21  8:18 AM  Result Value Ref Range   Glucose-Capillary 114 (H) 70 - 99 mg/dL  NM Myocar Multi W/Spect W/Wall Motion / EF   Collection Time: 07/25/21 11:01 AM  Result Value Ref Range   Rest HR 81.0 bpm   Rest BP 124/70 mmHg   Exercise duration (min) 0 min   Exercise duration (sec) 0 sec   Estimated workload 1.0    Peak HR 86 bpm   Peak BP 127/63 mmHg   MPHR 160 bpm   Percent HR 53.0 %   ST Depression (mm) 0 mm   Rest Nuclear Isotope Dose 10.2 mCi   Stress Nuclear Isotope Dose 31.8 mCi   SSS 0.0    SRS 5.0    SDS 0.0    TID 0.88    LV sys vol 29.0 mL   LV dias vol 118.0 62 - 150 mL   Nuc Stress EF 75 %  CBG monitoring, ED   Collection Time: 07/25/21 12:11 PM  Result Value Ref Range   Glucose-Capillary 188 (H) 70 - 99 mg/dL  ]  HC Readings from Last 3 Encounters:  No data found for [facility]    Wt Readings from Last 3 Encounters:  08/12/21 249 lb 11.2 oz (113.3 kg)  07/30/21 259 lb 6.4 oz (117.7 kg)  07/23/21 250 lb (113.4 kg)

## 2021-08-13 NOTE — Telephone Encounter (Signed)
08/13/2021 10:55:50 AM - Xifaxan call in refill -- Michael Mack - Wednesday, August 13, 2021 10:54 AM -- Called in refill request for Xifaxan allow 7-10business days.

## 2021-08-13 NOTE — Progress Notes (Unsigned)
Follow up Diabetes/ Endocrine [facility]     Patient ID: Michael Mack, male   DOB: [birth year], 61 y.o.   MRN: [id number] Assessment:  Michael Mack is a 61 y.o. male who is seen in follow up for Type 2 diabetes mellitus without complication, with long-term current use of insulin ([place]) [E11.9, Z79.4] at the request of Michael Millers, MD.  Encounter Diagnoses 1. Type 2 diabetes mellitus without complication, with long-term current use of insulin [facility])     Assessment  Patient is a 61 year old male with T2DM, HTN, hepatic encephalopathy, and other comorbidities who is currently not at goal with treatment but is improving (A1c 7.6 on 01.04 from 8.4 on 06/27/2020).  Plan:     -Maintain the same drug regimen (insulin glulisine -Apidra 30 units TID and insulin glargine -Lantus 50 units daily)  Patient was instructed to take Apidra 30 units with full meals but 20 units with small meals and snacks. This would prevent recurrent episodes of hypoglycemia.    Patient Instructions  It was a pleasure to meet you tonight.  -Take 30 units of insulin glulisine -Apidra with meals. But if you are not eating a full meal, take 20 units of Apidra.     No orders of the defined types were placed in this encounter.    Subjective:  Patient is a 61 year old male with an 8-year history of T2DM and history of HTN, GERD, chronic pain, hepatic encephalopathy, hepatitis C, hepatocellular carcinoma, migraines, obesity, and thrombocytopenia. He presents for a routine DM visit upon referral by his PCP.  Patient measures his BG 3-4x/day, with fasting glucose around 120. Based on his glucometer, his BG ranges from 50 to 440s. He currently manages his DM with insulin glulisine -Apidra 30 units TID with meals and insulin glargine -Lantus 50 units daily. It was noted that he often runs out of Apidra and needs bridging with Humalog; however, he demonstrated a clear understanding of how to administer the correct dose of  his long- and short-acting insulin.  Patient denies symptoms of hyperglycemia and peripheral neuropathy but does report hypoglycemia episodes (shakiness) about 2x/month.  He states that his recent eye exam was 6 months ago and foot exam within the past year.  Patient was recently hospitalized for hepatic encephalopathy but states feeling much better today.  Patient describes his diet as as cereal, biscuit (breakfast), canned soup and sandwich (lunch), meat and vegetables (dinner); he reports drinking diet/sugar-free drinks. He denies actively exercising due to "bad knees." He also denies smoking, using recreational drug, and consuming alcohol.    Review of Systems  Endocrine: Negative for polydipsia and polyuria.   Michael Mack  has a past medical history of Bulging lumbar disc, Chronic pain, Diabetes mellitus without complication ([place]), GERD (gastroesophageal reflux disease), Hepatic encephalopathy, Hepatitis C, Hepatocellular carcinoma ([place]), Hypertension, Migraines, Morbid obesity ([place]), and Thrombocytopenia ([place]).  Family History, Social History, current Medications and allergies reviewed and updated in Epic.  Objective:    Blood pressure (!) 153/77, pulse 79, weight 249 lb 11.2 oz (113.3 kg), SpO2 94 %. Physical Exam Constitutional:      Appearance: Normal appearance.  Pulmonary:     Effort: Pulmonary effort is normal.  Musculoskeletal:        General: No swelling.        Data : I have personally reviewed pertinent labs and imaging studies, if indicated,  with the patient in clinic today.   Lab Orders  No laboratory test(s) ordered  today    HC Readings from Last 3 Encounters:  No data found for [facility]    Wt Readings from Last 3 Encounters:  08/12/21 249 lb 11.2 oz (113.3 kg)  07/30/21 259 lb 6.4 oz (117.7 kg)  07/23/21 250 lb (113.4 kg)      New Diabetes Consultation [facility]     Patient ID: Michael Mack, male   DOB: 13-Feb-1961, 61 y.o.   MRN:  [id number] Assessment:  Michael Mack is a 61 y.o. male who is seen in consultation for Type 2 diabetes mellitus without complication, with long-term current use of insulin ([place]) [E11.9, Z79.4] at the request of Michael Millers, MD.  1. Type 2 diabetes mellitus without complication, with long-term current use of insulin [facility])     Endocrine Narrative Assessment:   Plan:     Patient Instructions  It was a pleasure to meet you tonight.  -Take 30 units of insulin glulisine -Apidra with meals. But if you are not eating a full meal, take 20 units of Apidra.     No orders of the defined types were placed in this encounter.  Subjective:  Patient is a 61 year-old male with T2DM   Review of Systems  Endocrine: Negative for polydipsia and polyuria.       Michael Mack  has a past medical history of Bulging lumbar disc, Chronic pain, Diabetes mellitus without complication ([place]), GERD (gastroesophageal reflux disease), Hepatic encephalopathy, Hepatitis C, Hepatocellular carcinoma ([place]), Hypertension, Migraines, Morbid obesity ([place]), and Thrombocytopenia ([place]).  Michael Mack family history includes COPD in his mother; Cancer in his mother; Heart attack (age of onset: 25) in his father.  Michael Mack  reports that he has never smoked. He has never used smokeless tobacco. He reports that he does not drink alcohol and does not use drugs.   Current Outpatient Medications:    acetaminophen (TYLENOL) 500 MG tablet, Take 500 mg by mouth as needed for headache. As needed, Disp: , Rfl:    amphetamine-dextroamphetamine (ADDERALL) 20 MG tablet, Take 20 mg by mouth daily., Disp: , Rfl:    Beta Carotene (VITAMIN A) 25000 UNIT capsule, Take 25,000 Units by mouth daily., Disp: , Rfl:    hydrochlorothiazide (HYDRODIURIL) 25 MG tablet, Take 25 mg by mouth daily., Disp: , Rfl:    insulin glulisine (APIDRA SOLOSTAR) 100 UNIT/ML Solostar Pen, Inject 30 Units into the skin 3 (three) times daily before  meals., Disp: 27 mL, Rfl: 3   insulin lispro (HUMALOG KWIKPEN) 100 UNIT/ML KwikPen, Inject 30 Units into the skin 3 (three) times daily before meals. (Patient taking differently: Inject 30 Units into the skin 3 (three) times daily before meals. Taking to bridge until Apidra available), Disp: 15 mL, Rfl: 3   Insulin Pen Needle 32G X 4 MM MISC, Use as directed with Insulin, Disp: 100 each, Rfl: 11   lactulose (CHRONULAC) 10 GM/15ML solution, TAKE 30ML BY MOUTH 3 TIMES DAILY., Disp: 2700 mL, Rfl: 11   LANTUS SOLOSTAR 100 UNIT/ML Solostar Pen, Inject 50 Units into the skin daily., Disp: 45 mL, Rfl: 0   lisinopril (ZESTRIL) 40 MG tablet, Take 40 mg by mouth daily., Disp: , Rfl:    methadone (DOLOPHINE) 10 MG tablet, 30 mg every 8 (eight) hours as needed for moderate pain or severe pain., Disp: , Rfl:    multivitamin-iron-minerals-folic acid (CENTRUM) chewable tablet, Chew 1 tablet by mouth daily., Disp: , Rfl:    naloxone (NARCAN) nasal spray 4  mg/0.1 mL, Place into the nose., Disp: , Rfl:    nitroGLYCERIN (NITROSTAT) 0.4 MG SL tablet, Place 1 tablet (0.4 mg total) under the tongue every 5 (five) minutes x 3 doses as needed for up to 3 days for chest pain., Disp: 6 tablet, Rfl: 12   Oxycodone HCl 20 MG TABS, Take 1 tablet by mouth every 4 (four) hours as needed. Prescribed by pain management, Disp: , Rfl:    XIFAXAN 550 MG TABS tablet, Take 1 tablet (550 mg total) by mouth 2 (two) times daily., Disp: 180 tablet, Rfl: 3   amitriptyline (ELAVIL) 50 MG tablet, 100 mg at bedtime. (Patient not taking: Reported on 08/12/2021), Disp: , Rfl:    Cholecalciferol 25 MCG (1000 UT) tablet, Take 1,000 Units by mouth daily. (Patient not taking: Reported on 08/12/2021), Disp: , Rfl:   No Known Allergies Objective:    Physical Exam Constitutional:      Appearance: Normal appearance.  Pulmonary:     Effort: Pulmonary effort is normal.  Musculoskeletal:        General: No swelling.  No asterixis       Data/Results/Labs  : I have personally reviewed pertinent labs and imaging studies, if indicated,  with the patient in clinic today.  Results for orders placed or performed during the hospital encounter of 07/23/21 (from the past 672 hour(s))  CBG monitoring, ED   Collection Time: 07/23/21  9:43 PM  Result Value Ref Range   Glucose-Capillary 208 (H) 70 - 99 mg/dL  Resp Panel by RT-PCR (Flu A&B, Covid) Nasopharyngeal Swab   Collection Time: 07/23/21  9:44 PM   Specimen: Nasopharyngeal Swab; Nasopharyngeal(NP) swabs in vial transport medium  Result Value Ref Range   SARS Coronavirus 2 by RT PCR NEGATIVE NEGATIVE   Influenza A by PCR NEGATIVE NEGATIVE   Influenza B by PCR NEGATIVE NEGATIVE  CBC with Differential   Collection Time: 07/23/21  9:44 PM  Result Value Ref Range   WBC 8.3 4.0 - 10.5 K/uL   RBC 4.87 4.22 - 5.81 MIL/uL   Hemoglobin 15.0 13.0 - 17.0 g/dL   HCT 44.3 39.0 - 52.0 %   MCV 91.0 80.0 - 100.0 fL   MCH 30.8 26.0 - 34.0 pg   MCHC 33.9 30.0 - 36.0 g/dL   RDW 13.2 11.5 - 15.5 %   Platelets 104 (L) 150 - 400 K/uL   nRBC 0.0 0.0 - 0.2 %   Neutrophils Relative % 72 %   Neutro Abs 6.1 1.7 - 7.7 K/uL   Lymphocytes Relative 17 %   Lymphs Abs 1.4 0.7 - 4.0 K/uL   Monocytes Relative 7 %   Monocytes Absolute 0.5 0.1 - 1.0 K/uL   Eosinophils Relative 2 %   Eosinophils Absolute 0.2 0.0 - 0.5 K/uL   Basophils Relative 1 %   Basophils Absolute 0.0 0.0 - 0.1 K/uL   Immature Granulocytes 1 %   Abs Immature Granulocytes 0.04 0.00 - 0.07 K/uL  Comprehensive metabolic panel   Collection Time: 07/23/21  9:44 PM  Result Value Ref Range   Sodium 140 135 - 145 mmol/L   Potassium 3.8 3.5 - 5.1 mmol/L   Chloride 103 98 - 111 mmol/L   CO2 27 22 - 32 mmol/L   Glucose, Bld 218 (H) 70 - 99 mg/dL   BUN 19 6 - 20 mg/dL   Creatinine, Ser 1.00 0.61 - 1.24 mg/dL   Calcium 9.2 8.9 - 10.3 mg/dL   Total Protein 7.8 6.5 -  8.1 g/dL   Albumin 3.9 3.5 - 5.0 g/dL   AST 39 15 - 41 U/L   ALT  20 0 - 44 U/L   Alkaline Phosphatase 61 38 - 126 U/L   Total Bilirubin 0.7 0.3 - 1.2 mg/dL   GFR, Estimated >60 >60 mL/min   Anion gap 10 5 - 15  Ammonia   Collection Time: 07/23/21  9:44 PM  Result Value Ref Range   Ammonia 107 (H) 9 - 35 umol/L  Brain natriuretic peptide   Collection Time: 07/23/21  9:44 PM  Result Value Ref Range   B Natriuretic Peptide 29.9 0.0 - 100.0 pg/mL  Troponin I (High Sensitivity)   Collection Time: 07/23/21  9:44 PM  Result Value Ref Range   Troponin I (High Sensitivity) 58 (H) <18 ng/L  Blood gas, venous   Collection Time: 07/23/21 10:05 PM  Result Value Ref Range   pH, Ven 7.30 7.250 - 7.430   pCO2, Ven 62 (H) 44.0 - 60.0 mmHg   pO2, Ven 54.0 (H) 32.0 - 45.0 mmHg   Bicarbonate 30.5 (H) 20.0 - 28.0 mmol/L   Acid-Base Excess 2.2 (H) 0.0 - 2.0 mmol/L   O2 Saturation 83.9 %   Patient temperature 37.0    Collection site VEIN    Sample type VENOUS   Troponin I (High Sensitivity)   Collection Time: 07/24/21 12:48 AM  Result Value Ref Range   Troponin I (High Sensitivity) 708 (HH) <18 ng/L  APTT   Collection Time: 07/24/21  1:57 AM  Result Value Ref Range   aPTT 29 24 - 36 seconds  Protime-INR   Collection Time: 07/24/21  1:57 AM  Result Value Ref Range   Prothrombin Time 15.2 11.4 - 15.2 seconds   INR 1.2 0.8 - 1.2  CBG monitoring, ED   Collection Time: 07/24/21  3:27 AM  Result Value Ref Range   Glucose-Capillary 197 (H) 70 - 99 mg/dL  CBC   Collection Time: 07/24/21  7:59 AM  Result Value Ref Range   WBC 5.8 4.0 - 10.5 K/uL   RBC 4.02 (L) 4.22 - 5.81 MIL/uL   Hemoglobin 12.5 (L) 13.0 - 17.0 g/dL   HCT 36.6 (L) 39.0 - 52.0 %   MCV 91.0 80.0 - 100.0 fL   MCH 31.1 26.0 - 34.0 pg   MCHC 34.2 30.0 - 36.0 g/dL   RDW 13.3 11.5 - 15.5 %   Platelets 86 (L) 150 - 400 K/uL   nRBC 0.0 0.0 - 0.2 %  HIV Antibody (routine testing w rflx)   Collection Time: 07/24/21  7:59 AM  Result Value Ref Range   HIV Screen 4th Generation wRfx Non Reactive  Non Reactive  Basic metabolic panel   Collection Time: 07/24/21  7:59 AM  Result Value Ref Range   Sodium 140 135 - 145 mmol/L   Potassium 4.0 3.5 - 5.1 mmol/L   Chloride 107 98 - 111 mmol/L   CO2 27 22 - 32 mmol/L   Glucose, Bld 197 (H) 70 - 99 mg/dL   BUN 24 (H) 6 - 20 mg/dL   Creatinine, Ser 0.97 0.61 - 1.24 mg/dL   Calcium 8.9 8.9 - 10.3 mg/dL   GFR, Estimated >60 >60 mL/min   Anion gap 6 5 - 15  Lipid panel   Collection Time: 07/24/21  7:59 AM  Result Value Ref Range   Cholesterol 129 0 - 200 mg/dL   Triglycerides 64 <150 mg/dL   HDL 43 >40 mg/dL  Total CHOL/HDL Ratio 3.0 RATIO   VLDL 13 0 - 40 mg/dL   LDL Cholesterol 73 0 - 99 mg/dL  Heparin level (unfractionated)   Collection Time: 07/24/21  7:59 AM  Result Value Ref Range   Heparin Unfractionated 0.14 (L) 0.30 - 0.70 IU/mL  Troponin I (High Sensitivity)   Collection Time: 07/24/21  7:59 AM  Result Value Ref Range   Troponin I (High Sensitivity) 1,611 (HH) <18 ng/L  CBG monitoring, ED   Collection Time: 07/24/21  8:04 AM  Result Value Ref Range   Glucose-Capillary 176 (H) 70 - 99 mg/dL  CBG monitoring, ED   Collection Time: 07/24/21 11:39 AM  Result Value Ref Range   Glucose-Capillary 306 (H) 70 - 99 mg/dL  ECHOCARDIOGRAM COMPLETE   Collection Time: 07/24/21  1:19 PM  Result Value Ref Range   Weight 4,000 oz   Height 65 in   BP 119/94 mmHg   S' Lateral 2.30 cm  CBG monitoring, ED   Collection Time: 07/24/21  4:21 PM  Result Value Ref Range   Glucose-Capillary 185 (H) 70 - 99 mg/dL  Heparin level (unfractionated)   Collection Time: 07/24/21  5:48 PM  Result Value Ref Range   Heparin Unfractionated 0.14 (L) 0.30 - 0.70 IU/mL  CBG monitoring, ED   Collection Time: 07/24/21  8:12 PM  Result Value Ref Range   Glucose-Capillary 173 (H) 70 - 99 mg/dL  CBG monitoring, ED   Collection Time: 07/25/21 12:37 AM  Result Value Ref Range   Glucose-Capillary 129 (H) 70 - 99 mg/dL  CBG monitoring, ED   Collection  Time: 07/25/21  4:23 AM  Result Value Ref Range   Glucose-Capillary 143 (H) 70 - 99 mg/dL  CBC with Differential/Platelet   Collection Time: 07/25/21  4:36 AM  Result Value Ref Range   WBC 6.1 4.0 - 10.5 K/uL   RBC 4.20 (L) 4.22 - 5.81 MIL/uL   Hemoglobin 12.8 (L) 13.0 - 17.0 g/dL   HCT 38.3 (L) 39.0 - 52.0 %   MCV 91.2 80.0 - 100.0 fL   MCH 30.5 26.0 - 34.0 pg   MCHC 33.4 30.0 - 36.0 g/dL   RDW 13.6 11.5 - 15.5 %   Platelets 102 (L) 150 - 400 K/uL   nRBC 0.0 0.0 - 0.2 %   Neutrophils Relative % 48 %   Neutro Abs 2.9 1.7 - 7.7 K/uL   Lymphocytes Relative 38 %   Lymphs Abs 2.3 0.7 - 4.0 K/uL   Monocytes Relative 8 %   Monocytes Absolute 0.5 0.1 - 1.0 K/uL   Eosinophils Relative 5 %   Eosinophils Absolute 0.3 0.0 - 0.5 K/uL   Basophils Relative 1 %   Basophils Absolute 0.0 0.0 - 0.1 K/uL   Immature Granulocytes 0 %   Abs Immature Granulocytes 0.01 0.00 - 0.07 K/uL  Basic metabolic panel   Collection Time: 07/25/21  4:36 AM  Result Value Ref Range   Sodium 137 135 - 145 mmol/L   Potassium 3.8 3.5 - 5.1 mmol/L   Chloride 106 98 - 111 mmol/L   CO2 28 22 - 32 mmol/L   Glucose, Bld 147 (H) 70 - 99 mg/dL   BUN 24 (H) 6 - 20 mg/dL   Creatinine, Ser 1.01 0.61 - 1.24 mg/dL   Calcium 8.6 (L) 8.9 - 10.3 mg/dL   GFR, Estimated >60 >60 mL/min   Anion gap 3 (L) 5 - 15  Ammonia   Collection Time: 07/25/21  4:36  AM  Result Value Ref Range   Ammonia 54 (H) 9 - 35 umol/L  Magnesium   Collection Time: 07/25/21  4:36 AM  Result Value Ref Range   Magnesium 2.2 1.7 - 2.4 mg/dL  Phosphorus   Collection Time: 07/25/21  4:36 AM  Result Value Ref Range   Phosphorus 4.0 2.5 - 4.6 mg/dL  Protime-INR   Collection Time: 07/25/21  4:36 AM  Result Value Ref Range   Prothrombin Time 15.6 (H) 11.4 - 15.2 seconds   INR 1.2 0.8 - 1.2  Heparin level (unfractionated)   Collection Time: 07/25/21  4:36 AM  Result Value Ref Range   Heparin Unfractionated 0.43 0.30 - 0.70 IU/mL  CBG monitoring, ED    Collection Time: 07/25/21  8:18 AM  Result Value Ref Range   Glucose-Capillary 114 (H) 70 - 99 mg/dL  NM Myocar Multi W/Spect W/Wall Motion / EF   Collection Time: 07/25/21 11:01 AM  Result Value Ref Range   Rest HR 81.0 bpm   Rest BP 124/70 mmHg   Exercise duration (min) 0 min   Exercise duration (sec) 0 sec   Estimated workload 1.0    Peak HR 86 bpm   Peak BP 127/63 mmHg   MPHR 160 bpm   Percent HR 53.0 %   ST Depression (mm) 0 mm   Rest Nuclear Isotope Dose 10.2 mCi   Stress Nuclear Isotope Dose 31.8 mCi   SSS 0.0    SRS 5.0    SDS 0.0    TID 0.88    LV sys vol 29.0 mL   LV dias vol 118.0 62 - 150 mL   Nuc Stress EF 75 %  CBG monitoring, ED   Collection Time: 07/25/21 12:11 PM  Result Value Ref Range   Glucose-Capillary 188 (H) 70 - 99 mg/dL  ]  HC Readings from Last 3 Encounters:  No data found for [facility]    Wt Readings from Last 3 Encounters:  08/12/21 249 lb 11.2 oz (113.3 kg)  07/30/21 259 lb 6.4 oz (117.7 kg)  07/23/21 250 lb (113.4 kg)

## 2021-08-13 NOTE — Progress Notes (Incomplete)
New Diabetes Consultation [facility]     Patient ID: Michael Mack, male   DOB: [birth year], 61 y.o.   MRN: [id number] Assessment:  Michael Mack is a 61 y.o. male who is seen in consultation for No primary diagnosis found. at the request of Tawni Millers, MD.  No diagnosis found.  Endocrine Narrative Assessment:   Plan:     Patient Instructions  It was a pleasure to meet you tonight.  -Take 30 units of insulin glulisine -Apidra with meals. But if you are not eating a full meal, take 20 units of Apidra.     No orders of the defined types were placed in this encounter.  Subjective:  Patient is a 61 year-old male with T2DM   Review of Systems  Endocrine: Negative for polydipsia and polyuria.       Michael Mack  has a past medical history of Bulging lumbar disc, Chronic pain, Diabetes mellitus without complication ([place]), GERD (gastroesophageal reflux disease), Hepatic encephalopathy, Hepatitis C, Hepatocellular carcinoma ([place]), Hypertension, Migraines, Morbid obesity ([place]), and Thrombocytopenia ([place]).  Michael Mack family history includes COPD in his mother; Cancer in his mother; Heart attack (age of onset: 25) in his father.  Michael Mack  reports that he has never smoked. He has never used smokeless tobacco. He reports that he does not drink alcohol and does not use drugs.   Current Outpatient Medications:    acetaminophen (TYLENOL) 500 MG tablet, Take 500 mg by mouth as needed for headache. As needed, Disp: , Rfl:    amphetamine-dextroamphetamine (ADDERALL) 20 MG tablet, Take 20 mg by mouth daily., Disp: , Rfl:    Beta Carotene (VITAMIN A) 25000 UNIT capsule, Take 25,000 Units by mouth daily., Disp: , Rfl:    hydrochlorothiazide (HYDRODIURIL) 25 MG tablet, Take 25 mg by mouth daily., Disp: , Rfl:    insulin glulisine (APIDRA SOLOSTAR) 100 UNIT/ML Solostar Pen, Inject 30 Units into the skin 3 (three) times daily before meals., Disp: 27 mL, Rfl: 3   insulin  lispro (HUMALOG KWIKPEN) 100 UNIT/ML KwikPen, Inject 30 Units into the skin 3 (three) times daily before meals. (Patient taking differently: Inject 30 Units into the skin 3 (three) times daily before meals. Taking to bridge until Apidra available), Disp: 15 mL, Rfl: 3   Insulin Pen Needle 32G X 4 MM MISC, Use as directed with Insulin, Disp: 100 each, Rfl: 11   lactulose (CHRONULAC) 10 GM/15ML solution, TAKE 30ML BY MOUTH 3 TIMES DAILY., Disp: 2700 mL, Rfl: 11   LANTUS SOLOSTAR 100 UNIT/ML Solostar Pen, Inject 50 Units into the skin daily., Disp: 45 mL, Rfl: 0   lisinopril (ZESTRIL) 40 MG tablet, Take 40 mg by mouth daily., Disp: , Rfl:    methadone (DOLOPHINE) 10 MG tablet, 30 mg every 8 (eight) hours as needed for moderate pain or severe pain., Disp: , Rfl:    multivitamin-iron-minerals-folic acid (CENTRUM) chewable tablet, Chew 1 tablet by mouth daily., Disp: , Rfl:    naloxone (NARCAN) nasal spray 4 mg/0.1 mL, Place into the nose., Disp: , Rfl:    nitroGLYCERIN (NITROSTAT) 0.4 MG SL tablet, Place 1 tablet (0.4 mg total) under the tongue every 5 (five) minutes x 3 doses as needed for up to 3 days for chest pain., Disp: 6 tablet, Rfl: 12   Oxycodone HCl 20 MG TABS, Take 1 tablet by mouth every 4 (four) hours as needed. Prescribed by pain management, Disp: , Rfl:    Doreene Nest  550 MG TABS tablet, Take 1 tablet (550 mg total) by mouth 2 (two) times daily., Disp: 180 tablet, Rfl: 3   amitriptyline (ELAVIL) 50 MG tablet, 100 mg at bedtime. (Patient not taking: Reported on 08/12/2021), Disp: , Rfl:    Cholecalciferol 25 MCG (1000 UT) tablet, Take 1,000 Units by mouth daily. (Patient not taking: Reported on 08/12/2021), Disp: , Rfl:   No Known Allergies Objective:    Physical Exam Constitutional:      Appearance: Normal appearance.  Pulmonary:     Effort: Pulmonary effort is normal.  Musculoskeletal:        General: No swelling.  No asterixis      Data/Results/Labs  : I have personally  reviewed pertinent labs and imaging studies, if indicated,  with the patient in clinic today.  Results for orders placed or performed during the hospital encounter of 07/23/21 (from the past 672 hour(s))  CBG monitoring, ED   Collection Time: 07/23/21  9:43 PM  Result Value Ref Range   Glucose-Capillary 208 (H) 70 - 99 mg/dL  Resp Panel by RT-PCR (Flu A&B, Covid) Nasopharyngeal Swab   Collection Time: 07/23/21  9:44 PM   Specimen: Nasopharyngeal Swab; Nasopharyngeal(NP) swabs in vial transport medium  Result Value Ref Range   SARS Coronavirus 2 by RT PCR NEGATIVE NEGATIVE   Influenza A by PCR NEGATIVE NEGATIVE   Influenza B by PCR NEGATIVE NEGATIVE  CBC with Differential   Collection Time: 07/23/21  9:44 PM  Result Value Ref Range   WBC 8.3 4.0 - 10.5 K/uL   RBC 4.87 4.22 - 5.81 MIL/uL   Hemoglobin 15.0 13.0 - 17.0 g/dL   HCT 44.3 39.0 - 52.0 %   MCV 91.0 80.0 - 100.0 fL   MCH 30.8 26.0 - 34.0 pg   MCHC 33.9 30.0 - 36.0 g/dL   RDW 13.2 11.5 - 15.5 %   Platelets 104 (L) 150 - 400 K/uL   nRBC 0.0 0.0 - 0.2 %   Neutrophils Relative % 72 %   Neutro Abs 6.1 1.7 - 7.7 K/uL   Lymphocytes Relative 17 %   Lymphs Abs 1.4 0.7 - 4.0 K/uL   Monocytes Relative 7 %   Monocytes Absolute 0.5 0.1 - 1.0 K/uL   Eosinophils Relative 2 %   Eosinophils Absolute 0.2 0.0 - 0.5 K/uL   Basophils Relative 1 %   Basophils Absolute 0.0 0.0 - 0.1 K/uL   Immature Granulocytes 1 %   Abs Immature Granulocytes 0.04 0.00 - 0.07 K/uL  Comprehensive metabolic panel   Collection Time: 07/23/21  9:44 PM  Result Value Ref Range   Sodium 140 135 - 145 mmol/L   Potassium 3.8 3.5 - 5.1 mmol/L   Chloride 103 98 - 111 mmol/L   CO2 27 22 - 32 mmol/L   Glucose, Bld 218 (H) 70 - 99 mg/dL   BUN 19 6 - 20 mg/dL   Creatinine, Ser 1.00 0.61 - 1.24 mg/dL   Calcium 9.2 8.9 - 10.3 mg/dL   Total Protein 7.8 6.5 - 8.1 g/dL   Albumin 3.9 3.5 - 5.0 g/dL   AST 39 15 - 41 U/L   ALT 20 0 - 44 U/L   Alkaline Phosphatase 61 38  - 126 U/L   Total Bilirubin 0.7 0.3 - 1.2 mg/dL   GFR, Estimated >60 >60 mL/min   Anion gap 10 5 - 15  Ammonia   Collection Time: 07/23/21  9:44 PM  Result Value Ref Range  Ammonia 107 (H) 9 - 35 umol/L  Brain natriuretic peptide   Collection Time: 07/23/21  9:44 PM  Result Value Ref Range   B Natriuretic Peptide 29.9 0.0 - 100.0 pg/mL  Troponin I (High Sensitivity)   Collection Time: 07/23/21  9:44 PM  Result Value Ref Range   Troponin I (High Sensitivity) 58 (H) <18 ng/L  Blood gas, venous   Collection Time: 07/23/21 10:05 PM  Result Value Ref Range   pH, Ven 7.30 7.250 - 7.430   pCO2, Ven 62 (H) 44.0 - 60.0 mmHg   pO2, Ven 54.0 (H) 32.0 - 45.0 mmHg   Bicarbonate 30.5 (H) 20.0 - 28.0 mmol/L   Acid-Base Excess 2.2 (H) 0.0 - 2.0 mmol/L   O2 Saturation 83.9 %   Patient temperature 37.0    Collection site VEIN    Sample type VENOUS   Troponin I (High Sensitivity)   Collection Time: 07/24/21 12:48 AM  Result Value Ref Range   Troponin I (High Sensitivity) 708 (HH) <18 ng/L  APTT   Collection Time: 07/24/21  1:57 AM  Result Value Ref Range   aPTT 29 24 - 36 seconds  Protime-INR   Collection Time: 07/24/21  1:57 AM  Result Value Ref Range   Prothrombin Time 15.2 11.4 - 15.2 seconds   INR 1.2 0.8 - 1.2  CBG monitoring, ED   Collection Time: 07/24/21  3:27 AM  Result Value Ref Range   Glucose-Capillary 197 (H) 70 - 99 mg/dL  CBC   Collection Time: 07/24/21  7:59 AM  Result Value Ref Range   WBC 5.8 4.0 - 10.5 K/uL   RBC 4.02 (L) 4.22 - 5.81 MIL/uL   Hemoglobin 12.5 (L) 13.0 - 17.0 g/dL   HCT 36.6 (L) 39.0 - 52.0 %   MCV 91.0 80.0 - 100.0 fL   MCH 31.1 26.0 - 34.0 pg   MCHC 34.2 30.0 - 36.0 g/dL   RDW 13.3 11.5 - 15.5 %   Platelets 86 (L) 150 - 400 K/uL   nRBC 0.0 0.0 - 0.2 %  HIV Antibody (routine testing w rflx)   Collection Time: 07/24/21  7:59 AM  Result Value Ref Range   HIV Screen 4th Generation wRfx Non Reactive Non Reactive  Basic metabolic panel    Collection Time: 07/24/21  7:59 AM  Result Value Ref Range   Sodium 140 135 - 145 mmol/L   Potassium 4.0 3.5 - 5.1 mmol/L   Chloride 107 98 - 111 mmol/L   CO2 27 22 - 32 mmol/L   Glucose, Bld 197 (H) 70 - 99 mg/dL   BUN 24 (H) 6 - 20 mg/dL   Creatinine, Ser 0.97 0.61 - 1.24 mg/dL   Calcium 8.9 8.9 - 10.3 mg/dL   GFR, Estimated >60 >60 mL/min   Anion gap 6 5 - 15  Lipid panel   Collection Time: 07/24/21  7:59 AM  Result Value Ref Range   Cholesterol 129 0 - 200 mg/dL   Triglycerides 64 <150 mg/dL   HDL 43 >40 mg/dL   Total CHOL/HDL Ratio 3.0 RATIO   VLDL 13 0 - 40 mg/dL   LDL Cholesterol 73 0 - 99 mg/dL  Heparin level (unfractionated)   Collection Time: 07/24/21  7:59 AM  Result Value Ref Range   Heparin Unfractionated 0.14 (L) 0.30 - 0.70 IU/mL  Troponin I (High Sensitivity)   Collection Time: 07/24/21  7:59 AM  Result Value Ref Range   Troponin I (High Sensitivity) 1,611 (HH) <  18 ng/L  CBG monitoring, ED   Collection Time: 07/24/21  8:04 AM  Result Value Ref Range   Glucose-Capillary 176 (H) 70 - 99 mg/dL  CBG monitoring, ED   Collection Time: 07/24/21 11:39 AM  Result Value Ref Range   Glucose-Capillary 306 (H) 70 - 99 mg/dL  ECHOCARDIOGRAM COMPLETE   Collection Time: 07/24/21  1:19 PM  Result Value Ref Range   Weight 4,000 oz   Height 65 in   BP 119/94 mmHg   S' Lateral 2.30 cm  CBG monitoring, ED   Collection Time: 07/24/21  4:21 PM  Result Value Ref Range   Glucose-Capillary 185 (H) 70 - 99 mg/dL  Heparin level (unfractionated)   Collection Time: 07/24/21  5:48 PM  Result Value Ref Range   Heparin Unfractionated 0.14 (L) 0.30 - 0.70 IU/mL  CBG monitoring, ED   Collection Time: 07/24/21  8:12 PM  Result Value Ref Range   Glucose-Capillary 173 (H) 70 - 99 mg/dL  CBG monitoring, ED   Collection Time: 07/25/21 12:37 AM  Result Value Ref Range   Glucose-Capillary 129 (H) 70 - 99 mg/dL  CBG monitoring, ED   Collection Time: 07/25/21  4:23 AM  Result Value  Ref Range   Glucose-Capillary 143 (H) 70 - 99 mg/dL  CBC with Differential/Platelet   Collection Time: 07/25/21  4:36 AM  Result Value Ref Range   WBC 6.1 4.0 - 10.5 K/uL   RBC 4.20 (L) 4.22 - 5.81 MIL/uL   Hemoglobin 12.8 (L) 13.0 - 17.0 g/dL   HCT 38.3 (L) 39.0 - 52.0 %   MCV 91.2 80.0 - 100.0 fL   MCH 30.5 26.0 - 34.0 pg   MCHC 33.4 30.0 - 36.0 g/dL   RDW 13.6 11.5 - 15.5 %   Platelets 102 (L) 150 - 400 K/uL   nRBC 0.0 0.0 - 0.2 %   Neutrophils Relative % 48 %   Neutro Abs 2.9 1.7 - 7.7 K/uL   Lymphocytes Relative 38 %   Lymphs Abs 2.3 0.7 - 4.0 K/uL   Monocytes Relative 8 %   Monocytes Absolute 0.5 0.1 - 1.0 K/uL   Eosinophils Relative 5 %   Eosinophils Absolute 0.3 0.0 - 0.5 K/uL   Basophils Relative 1 %   Basophils Absolute 0.0 0.0 - 0.1 K/uL   Immature Granulocytes 0 %   Abs Immature Granulocytes 0.01 0.00 - 0.07 K/uL  Basic metabolic panel   Collection Time: 07/25/21  4:36 AM  Result Value Ref Range   Sodium 137 135 - 145 mmol/L   Potassium 3.8 3.5 - 5.1 mmol/L   Chloride 106 98 - 111 mmol/L   CO2 28 22 - 32 mmol/L   Glucose, Bld 147 (H) 70 - 99 mg/dL   BUN 24 (H) 6 - 20 mg/dL   Creatinine, Ser 1.01 0.61 - 1.24 mg/dL   Calcium 8.6 (L) 8.9 - 10.3 mg/dL   GFR, Estimated >60 >60 mL/min   Anion gap 3 (L) 5 - 15  Ammonia   Collection Time: 07/25/21  4:36 AM  Result Value Ref Range   Ammonia 54 (H) 9 - 35 umol/L  Magnesium   Collection Time: 07/25/21  4:36 AM  Result Value Ref Range   Magnesium 2.2 1.7 - 2.4 mg/dL  Phosphorus   Collection Time: 07/25/21  4:36 AM  Result Value Ref Range   Phosphorus 4.0 2.5 - 4.6 mg/dL  Protime-INR   Collection Time: 07/25/21  4:36 AM  Result Value Ref  Range   Prothrombin Time 15.6 (H) 11.4 - 15.2 seconds   INR 1.2 0.8 - 1.2  Heparin level (unfractionated)   Collection Time: 07/25/21  4:36 AM  Result Value Ref Range   Heparin Unfractionated 0.43 0.30 - 0.70 IU/mL  CBG monitoring, ED   Collection Time: 07/25/21  8:18 AM   Result Value Ref Range   Glucose-Capillary 114 (H) 70 - 99 mg/dL  NM Myocar Multi W/Spect W/Wall Motion / EF   Collection Time: 07/25/21 11:01 AM  Result Value Ref Range   Rest HR 81.0 bpm   Rest BP 124/70 mmHg   Exercise duration (min) 0 min   Exercise duration (sec) 0 sec   Estimated workload 1.0    Peak HR 86 bpm   Peak BP 127/63 mmHg   MPHR 160 bpm   Percent HR 53.0 %   ST Depression (mm) 0 mm   Rest Nuclear Isotope Dose 10.2 mCi   Stress Nuclear Isotope Dose 31.8 mCi   SSS 0.0    SRS 5.0    SDS 0.0    TID 0.88    LV sys vol 29.0 mL   LV dias vol 118.0 62 - 150 mL   Nuc Stress EF 75 %  CBG monitoring, ED   Collection Time: 07/25/21 12:11 PM  Result Value Ref Range   Glucose-Capillary 188 (H) 70 - 99 mg/dL  ]  HC Readings from Last 3 Encounters:  No data found for [facility]    Wt Readings from Last 3 Encounters:  08/12/21 249 lb 11.2 oz (113.3 kg)  07/30/21 259 lb 6.4 oz (117.7 kg)  07/23/21 250 lb (113.4 kg)

## 2021-08-13 NOTE — Progress Notes (Signed)
Follow up Diabetes/ Endocrine [facility]     Patient ID: Michael Mack, male   DOB: Jan 06, 1961, 61 y.o.   MRN: [id number] Assessment:  Michael Mack is a 61 y.o. male who is seen in follow up for Type 2 diabetes mellitus without complication, with long-term current use of insulin ([place]) [E11.9, Z79.4] at the request of Tawni Millers, MD.  Encounter Diagnoses 1. Type 2 diabetes mellitus without complication, with long-term current use of insulin [facility])     Assessment  Patient is a 61 year old male with T2DM, HTN, hepatic encephalopathy, and other comorbidities who is currently not at goal with treatment but is improving (A1c 7.6 on 01.04 from 8.4 on 06/27/2020).  Plan:     -Maintain the same drug regimen (insulin glulisine -Apidra 30 units TID and insulin glargine -Lantus 50 units daily)  Patient was instructed to take Apidra 30 units with full meals but 20 units with small meals and snacks. This would prevent recurrent episodes of hypoglycemia.    Patient Instructions  It was a pleasure to meet you tonight.  -Take 30 units of insulin glulisine -Apidra with meals. But if you are not eating a full meal, take 20 units of Apidra.     No orders of the defined types were placed in this encounter.    Subjective:  Patient is a 61 year old male with an 8-year history of T2DM and history of HTN, GERD, chronic pain, hepatic encephalopathy, hepatitis C, hepatocellular carcinoma, migraines, obesity, and thrombocytopenia. He presents for a routine DM visit upon referral by his PCP.  Patient measures his BG 3-4x/day, with fasting glucose around 120. Based on his glucometer, his BG ranges from 50 to 440s. He currently manages his DM with insulin glulisine -Apidra 30 units TID with meals and insulin glargine -Lantus 50 units daily. It was noted that he often runs out of Apidra and needs bridging with Humalog; however, he demonstrated a clear understanding of how to administer the correct dose of  his long- and short-acting insulin.  Patient denies symptoms of hyperglycemia and peripheral neuropathy but does report hypoglycemia episodes (shakiness) about 2x/month.  He states that his recent eye exam was 6 months ago and foot exam within the past year.  Patient was recently hospitalized for hepatic encephalopathy but states feeling much better today.  Patient describes his diet as as cereal, biscuit (breakfast), canned soup and sandwich (lunch), meat and vegetables (dinner); he reports drinking diet/sugar-free drinks. He denies actively exercising due to "bad knees." He also denies smoking, using recreational drug, and consuming alcohol.    Review of Systems  Endocrine: Negative for polydipsia and polyuria.   Michael Mack  has a past medical history of Bulging lumbar disc, Chronic pain, Diabetes mellitus without complication ([place]), GERD (gastroesophageal reflux disease), Hepatic encephalopathy, Hepatitis C, Hepatocellular carcinoma ([place]), Hypertension, Migraines, Morbid obesity ([employer]), and Thrombocytopenia ([place]).  Family History, Social History, current Medications and allergies reviewed and updated in Epic.  Objective:    Blood pressure (!) 153/77, pulse 79, weight 249 lb 11.2 oz (113.3 kg), SpO2 94 %. Physical Exam Constitutional:      Appearance: Normal appearance.  Pulmonary:     Effort: Pulmonary effort is normal.  Musculoskeletal:        General: No swelling.        Data : I have personally reviewed pertinent labs and imaging studies, if indicated,  with the patient in clinic today.   Lab Orders  No laboratory test(s) ordered  today    HC Readings from Last 3 Encounters:  No data found for [facility]    Wt Readings from Last 3 Encounters:  08/12/21 249 lb 11.2 oz (113.3 kg)  07/30/21 259 lb 6.4 oz (117.7 kg)  07/23/21 250 lb (113.4 kg)      New Diabetes Consultation [facility]     Patient ID: Michael Mack, male   DOB: [birth year], 61 y.o.   MRN:  [id number] Assessment:  Michael Mack is a 61 y.o. male who is seen in consultation for Type 2 diabetes mellitus without complication, with long-term current use of insulin ([place]) [E11.9, Z79.4] at the request of Tawni Millers, MD.  1. Type 2 diabetes mellitus without complication, with long-term current use of insulin [facility])     Endocrine Narrative Assessment:   Plan:     Patient Instructions  It was a pleasure to meet you tonight.  -Take 30 units of insulin glulisine -Apidra with meals. But if you are not eating a full meal, take 20 units of Apidra.     No orders of the defined types were placed in this encounter.  Subjective:  Patient is a 61 year-old male with T2DM   Review of Systems  Endocrine: Negative for polydipsia and polyuria.       Michael Mack  has a past medical history of Bulging lumbar disc, Chronic pain, Diabetes mellitus without complication ([place]), GERD (gastroesophageal reflux disease), Hepatic encephalopathy, Hepatitis C, Hepatocellular carcinoma ([place]), Hypertension, Migraines, Morbid obesity ([place]), and Thrombocytopenia ([place]).  [name] family history includes COPD in his mother; Cancer in his mother; Heart attack (age of onset: 62) in his father.  Michael Mack  reports that he has never smoked. He has never used smokeless tobacco. He reports that he does not drink alcohol and does not use drugs.   Current Outpatient Medications:    acetaminophen (TYLENOL) 500 MG tablet, Take 500 mg by mouth as needed for headache. As needed, Disp: , Rfl:    amphetamine-dextroamphetamine (ADDERALL) 20 MG tablet, Take 20 mg by mouth daily., Disp: , Rfl:    Beta Carotene (VITAMIN A) 25000 UNIT capsule, Take 25,000 Units by mouth daily., Disp: , Rfl:    hydrochlorothiazide (HYDRODIURIL) 25 MG tablet, Take 25 mg by mouth daily., Disp: , Rfl:    insulin glulisine (APIDRA SOLOSTAR) 100 UNIT/ML Solostar Pen, Inject 30 Units into the skin 3 (three) times daily before  meals., Disp: 27 mL, Rfl: 3   insulin lispro (HUMALOG KWIKPEN) 100 UNIT/ML KwikPen, Inject 30 Units into the skin 3 (three) times daily before meals. (Patient taking differently: Inject 30 Units into the skin 3 (three) times daily before meals. Taking to bridge until Apidra available), Disp: 15 mL, Rfl: 3   Insulin Pen Needle 32G X 4 MM MISC, Use as directed with Insulin, Disp: 100 each, Rfl: 11   lactulose (CHRONULAC) 10 GM/15ML solution, TAKE 30ML BY MOUTH 3 TIMES DAILY., Disp: 2700 mL, Rfl: 11   LANTUS SOLOSTAR 100 UNIT/ML Solostar Pen, Inject 50 Units into the skin daily., Disp: 45 mL, Rfl: 0   lisinopril (ZESTRIL) 40 MG tablet, Take 40 mg by mouth daily., Disp: , Rfl:    methadone (DOLOPHINE) 10 MG tablet, 30 mg every 8 (eight) hours as needed for moderate pain or severe pain., Disp: , Rfl:    multivitamin-iron-minerals-folic acid (CENTRUM) chewable tablet, Chew 1 tablet by mouth daily., Disp: , Rfl:    naloxone (NARCAN) nasal spray 4  mg/0.1 mL, Place into the nose., Disp: , Rfl:    nitroGLYCERIN (NITROSTAT) 0.4 MG SL tablet, Place 1 tablet (0.4 mg total) under the tongue every 5 (five) minutes x 3 doses as needed for up to 3 days for chest pain., Disp: 6 tablet, Rfl: 12   Oxycodone HCl 20 MG TABS, Take 1 tablet by mouth every 4 (four) hours as needed. Prescribed by pain management, Disp: , Rfl:    XIFAXAN 550 MG TABS tablet, Take 1 tablet (550 mg total) by mouth 2 (two) times daily., Disp: 180 tablet, Rfl: 3   amitriptyline (ELAVIL) 50 MG tablet, 100 mg at bedtime. (Patient not taking: Reported on 08/12/2021), Disp: , Rfl:    Cholecalciferol 25 MCG (1000 UT) tablet, Take 1,000 Units by mouth daily. (Patient not taking: Reported on 08/12/2021), Disp: , Rfl:   No Known Allergies Objective:    Physical Exam Constitutional:      Appearance: Normal appearance.  Pulmonary:     Effort: Pulmonary effort is normal.  Musculoskeletal:        General: No swelling.  No asterixis       Data/Results/Labs  : I have personally reviewed pertinent labs and imaging studies, if indicated,  with the patient in clinic today.  Results for orders placed or performed during the hospital encounter of 07/23/21 (from the past 672 hour(s))  CBG monitoring, ED   Collection Time: 07/23/21  9:43 PM  Result Value Ref Range   Glucose-Capillary 208 (H) 70 - 99 mg/dL  Resp Panel by RT-PCR (Flu A&B, Covid) Nasopharyngeal Swab   Collection Time: 07/23/21  9:44 PM   Specimen: Nasopharyngeal Swab; Nasopharyngeal(NP) swabs in vial transport medium  Result Value Ref Range   SARS Coronavirus 2 by RT PCR NEGATIVE NEGATIVE   Influenza A by PCR NEGATIVE NEGATIVE   Influenza B by PCR NEGATIVE NEGATIVE  CBC with Differential   Collection Time: 07/23/21  9:44 PM  Result Value Ref Range   WBC 8.3 4.0 - 10.5 K/uL   RBC 4.87 4.22 - 5.81 MIL/uL   Hemoglobin 15.0 13.0 - 17.0 g/dL   HCT 44.3 39.0 - 52.0 %   MCV 91.0 80.0 - 100.0 fL   MCH 30.8 26.0 - 34.0 pg   MCHC 33.9 30.0 - 36.0 g/dL   RDW 13.2 11.5 - 15.5 %   Platelets 104 (L) 150 - 400 K/uL   nRBC 0.0 0.0 - 0.2 %   Neutrophils Relative % 72 %   Neutro Abs 6.1 1.7 - 7.7 K/uL   Lymphocytes Relative 17 %   Lymphs Abs 1.4 0.7 - 4.0 K/uL   Monocytes Relative 7 %   Monocytes Absolute 0.5 0.1 - 1.0 K/uL   Eosinophils Relative 2 %   Eosinophils Absolute 0.2 0.0 - 0.5 K/uL   Basophils Relative 1 %   Basophils Absolute 0.0 0.0 - 0.1 K/uL   Immature Granulocytes 1 %   Abs Immature Granulocytes 0.04 0.00 - 0.07 K/uL  Comprehensive metabolic panel   Collection Time: 07/23/21  9:44 PM  Result Value Ref Range   Sodium 140 135 - 145 mmol/L   Potassium 3.8 3.5 - 5.1 mmol/L   Chloride 103 98 - 111 mmol/L   CO2 27 22 - 32 mmol/L   Glucose, Bld 218 (H) 70 - 99 mg/dL   BUN 19 6 - 20 mg/dL   Creatinine, Ser 1.00 0.61 - 1.24 mg/dL   Calcium 9.2 8.9 - 10.3 mg/dL   Total Protein 7.8 6.5 -  8.1 g/dL   Albumin 3.9 3.5 - 5.0 g/dL   AST 39 15 - 41 U/L   ALT  20 0 - 44 U/L   Alkaline Phosphatase 61 38 - 126 U/L   Total Bilirubin 0.7 0.3 - 1.2 mg/dL   GFR, Estimated >60 >60 mL/min   Anion gap 10 5 - 15  Ammonia   Collection Time: 07/23/21  9:44 PM  Result Value Ref Range   Ammonia 107 (H) 9 - 35 umol/L  Brain natriuretic peptide   Collection Time: 07/23/21  9:44 PM  Result Value Ref Range   B Natriuretic Peptide 29.9 0.0 - 100.0 pg/mL  Troponin I (High Sensitivity)   Collection Time: 07/23/21  9:44 PM  Result Value Ref Range   Troponin I (High Sensitivity) 58 (H) <18 ng/L  Blood gas, venous   Collection Time: 07/23/21 10:05 PM  Result Value Ref Range   pH, Ven 7.30 7.250 - 7.430   pCO2, Ven 62 (H) 44.0 - 60.0 mmHg   pO2, Ven 54.0 (H) 32.0 - 45.0 mmHg   Bicarbonate 30.5 (H) 20.0 - 28.0 mmol/L   Acid-Base Excess 2.2 (H) 0.0 - 2.0 mmol/L   O2 Saturation 83.9 %   Patient temperature 37.0    Collection site VEIN    Sample type VENOUS   Troponin I (High Sensitivity)   Collection Time: 07/24/21 12:48 AM  Result Value Ref Range   Troponin I (High Sensitivity) 708 (HH) <18 ng/L  APTT   Collection Time: 07/24/21  1:57 AM  Result Value Ref Range   aPTT 29 24 - 36 seconds  Protime-INR   Collection Time: 07/24/21  1:57 AM  Result Value Ref Range   Prothrombin Time 15.2 11.4 - 15.2 seconds   INR 1.2 0.8 - 1.2  CBG monitoring, ED   Collection Time: 07/24/21  3:27 AM  Result Value Ref Range   Glucose-Capillary 197 (H) 70 - 99 mg/dL  CBC   Collection Time: 07/24/21  7:59 AM  Result Value Ref Range   WBC 5.8 4.0 - 10.5 K/uL   RBC 4.02 (L) 4.22 - 5.81 MIL/uL   Hemoglobin 12.5 (L) 13.0 - 17.0 g/dL   HCT 36.6 (L) 39.0 - 52.0 %   MCV 91.0 80.0 - 100.0 fL   MCH 31.1 26.0 - 34.0 pg   MCHC 34.2 30.0 - 36.0 g/dL   RDW 13.3 11.5 - 15.5 %   Platelets 86 (L) 150 - 400 K/uL   nRBC 0.0 0.0 - 0.2 %  HIV Antibody (routine testing w rflx)   Collection Time: 07/24/21  7:59 AM  Result Value Ref Range   HIV Screen 4th Generation wRfx Non Reactive  Non Reactive  Basic metabolic panel   Collection Time: 07/24/21  7:59 AM  Result Value Ref Range   Sodium 140 135 - 145 mmol/L   Potassium 4.0 3.5 - 5.1 mmol/L   Chloride 107 98 - 111 mmol/L   CO2 27 22 - 32 mmol/L   Glucose, Bld 197 (H) 70 - 99 mg/dL   BUN 24 (H) 6 - 20 mg/dL   Creatinine, Ser 0.97 0.61 - 1.24 mg/dL   Calcium 8.9 8.9 - 10.3 mg/dL   GFR, Estimated >60 >60 mL/min   Anion gap 6 5 - 15  Lipid panel   Collection Time: 07/24/21  7:59 AM  Result Value Ref Range   Cholesterol 129 0 - 200 mg/dL   Triglycerides 64 <150 mg/dL   HDL 43 >40 mg/dL  Total CHOL/HDL Ratio 3.0 RATIO   VLDL 13 0 - 40 mg/dL   LDL Cholesterol 73 0 - 99 mg/dL  Heparin level (unfractionated)   Collection Time: 07/24/21  7:59 AM  Result Value Ref Range   Heparin Unfractionated 0.14 (L) 0.30 - 0.70 IU/mL  Troponin I (High Sensitivity)   Collection Time: 07/24/21  7:59 AM  Result Value Ref Range   Troponin I (High Sensitivity) 1,611 (HH) <18 ng/L  CBG monitoring, ED   Collection Time: 07/24/21  8:04 AM  Result Value Ref Range   Glucose-Capillary 176 (H) 70 - 99 mg/dL  CBG monitoring, ED   Collection Time: 07/24/21 11:39 AM  Result Value Ref Range   Glucose-Capillary 306 (H) 70 - 99 mg/dL  ECHOCARDIOGRAM COMPLETE   Collection Time: 07/24/21  1:19 PM  Result Value Ref Range   Weight 4,000 oz   Height 65 in   BP 119/94 mmHg   S' Lateral 2.30 cm  CBG monitoring, ED   Collection Time: 07/24/21  4:21 PM  Result Value Ref Range   Glucose-Capillary 185 (H) 70 - 99 mg/dL  Heparin level (unfractionated)   Collection Time: 07/24/21  5:48 PM  Result Value Ref Range   Heparin Unfractionated 0.14 (L) 0.30 - 0.70 IU/mL  CBG monitoring, ED   Collection Time: 07/24/21  8:12 PM  Result Value Ref Range   Glucose-Capillary 173 (H) 70 - 99 mg/dL  CBG monitoring, ED   Collection Time: 07/25/21 12:37 AM  Result Value Ref Range   Glucose-Capillary 129 (H) 70 - 99 mg/dL  CBG monitoring, ED   Collection  Time: 07/25/21  4:23 AM  Result Value Ref Range   Glucose-Capillary 143 (H) 70 - 99 mg/dL  CBC with Differential/Platelet   Collection Time: 07/25/21  4:36 AM  Result Value Ref Range   WBC 6.1 4.0 - 10.5 K/uL   RBC 4.20 (L) 4.22 - 5.81 MIL/uL   Hemoglobin 12.8 (L) 13.0 - 17.0 g/dL   HCT 38.3 (L) 39.0 - 52.0 %   MCV 91.2 80.0 - 100.0 fL   MCH 30.5 26.0 - 34.0 pg   MCHC 33.4 30.0 - 36.0 g/dL   RDW 13.6 11.5 - 15.5 %   Platelets 102 (L) 150 - 400 K/uL   nRBC 0.0 0.0 - 0.2 %   Neutrophils Relative % 48 %   Neutro Abs 2.9 1.7 - 7.7 K/uL   Lymphocytes Relative 38 %   Lymphs Abs 2.3 0.7 - 4.0 K/uL   Monocytes Relative 8 %   Monocytes Absolute 0.5 0.1 - 1.0 K/uL   Eosinophils Relative 5 %   Eosinophils Absolute 0.3 0.0 - 0.5 K/uL   Basophils Relative 1 %   Basophils Absolute 0.0 0.0 - 0.1 K/uL   Immature Granulocytes 0 %   Abs Immature Granulocytes 0.01 0.00 - 0.07 K/uL  Basic metabolic panel   Collection Time: 07/25/21  4:36 AM  Result Value Ref Range   Sodium 137 135 - 145 mmol/L   Potassium 3.8 3.5 - 5.1 mmol/L   Chloride 106 98 - 111 mmol/L   CO2 28 22 - 32 mmol/L   Glucose, Bld 147 (H) 70 - 99 mg/dL   BUN 24 (H) 6 - 20 mg/dL   Creatinine, Ser 1.01 0.61 - 1.24 mg/dL   Calcium 8.6 (L) 8.9 - 10.3 mg/dL   GFR, Estimated >60 >60 mL/min   Anion gap 3 (L) 5 - 15  Ammonia   Collection Time: 07/25/21  4:36  AM  Result Value Ref Range   Ammonia 54 (H) 9 - 35 umol/L  Magnesium   Collection Time: 07/25/21  4:36 AM  Result Value Ref Range   Magnesium 2.2 1.7 - 2.4 mg/dL  Phosphorus   Collection Time: 07/25/21  4:36 AM  Result Value Ref Range   Phosphorus 4.0 2.5 - 4.6 mg/dL  Protime-INR   Collection Time: 07/25/21  4:36 AM  Result Value Ref Range   Prothrombin Time 15.6 (H) 11.4 - 15.2 seconds   INR 1.2 0.8 - 1.2  Heparin level (unfractionated)   Collection Time: 07/25/21  4:36 AM  Result Value Ref Range   Heparin Unfractionated 0.43 0.30 - 0.70 IU/mL  CBG monitoring, ED    Collection Time: 07/25/21  8:18 AM  Result Value Ref Range   Glucose-Capillary 114 (H) 70 - 99 mg/dL  NM Myocar Multi W/Spect W/Wall Motion / EF   Collection Time: 07/25/21 11:01 AM  Result Value Ref Range   Rest HR 81.0 bpm   Rest BP 124/70 mmHg   Exercise duration (min) 0 min   Exercise duration (sec) 0 sec   Estimated workload 1.0    Peak HR 86 bpm   Peak BP 127/63 mmHg   MPHR 160 bpm   Percent HR 53.0 %   ST Depression (mm) 0 mm   Rest Nuclear Isotope Dose 10.2 mCi   Stress Nuclear Isotope Dose 31.8 mCi   SSS 0.0    SRS 5.0    SDS 0.0    TID 0.88    LV sys vol 29.0 mL   LV dias vol 118.0 62 - 150 mL   Nuc Stress EF 75 %  CBG monitoring, ED   Collection Time: 07/25/21 12:11 PM  Result Value Ref Range   Glucose-Capillary 188 (H) 70 - 99 mg/dL  ]  HC Readings from Last 3 Encounters:  No data found for [facility]    Wt Readings from Last 3 Encounters:  08/12/21 249 lb 11.2 oz (113.3 kg)  07/30/21 259 lb 6.4 oz (117.7 kg)  07/23/21 250 lb (113.4 kg)

## 2021-08-14 LAB — LIPID PANEL
Chol/HDL Ratio: 2.5 ratio (ref 0.0–5.0)
Cholesterol, Total: 154 mg/dL (ref 100–199)
HDL: 61 mg/dL (ref >39)
LDL Chol Calc (NIH): 73 mg/dL (ref 0–99)
Triglycerides: 113 mg/dL (ref 0–149)
VLDL Cholesterol Cal: 20 mg/dL (ref 5–40)

## 2021-08-14 LAB — HEMOGLOBIN A1C
Est. average glucose Bld gHb Est-mCnc: 140 mg/dL
Hgb A1c MFr Bld: 6.5 % — ABNORMAL HIGH (ref 4.8–5.6)

## 2021-08-14 LAB — COMPREHENSIVE METABOLIC PANEL
ALT: 25 IU/L (ref 0–44)
AST: 29 IU/L (ref 0–40)
Albumin/Globulin Ratio: 1.3 (ref 1.2–2.2)
Albumin: 4.3 g/dL (ref 3.8–4.9)
Alkaline Phosphatase: 69 IU/L (ref 44–121)
BUN/Creatinine Ratio: 23 (ref 10–24)
BUN: 18 mg/dL (ref 8–27)
Bilirubin Total: 0.8 mg/dL (ref 0.0–1.2)
CO2: 21 mmol/L (ref 20–29)
Calcium: 9.3 mg/dL (ref 8.6–10.2)
Chloride: 100 mmol/L (ref 96–106)
Creatinine, Ser: 0.8 mg/dL (ref 0.76–1.27)
Globulin, Total: 3.2 g/dL (ref 1.5–4.5)
Glucose: 139 mg/dL — ABNORMAL HIGH (ref 70–99)
Potassium: 3.9 mmol/L (ref 3.5–5.2)
Sodium: 140 mmol/L (ref 134–144)
Total Protein: 7.5 g/dL (ref 6.0–8.5)
eGFR: 101 mL/min/{1.73_m2} (ref >59)

## 2021-08-14 LAB — MICROSCOPIC EXAMINATION
Bacteria, UA: NONE SEEN
Casts: NONE SEEN /lpf
RBC, Urine: NONE SEEN /hpf (ref 0–2)
WBC, UA: >30 /hpf — AB (ref 0–5)

## 2021-08-14 LAB — CBC WITH DIFFERENTIAL/PLATELET
Basophils Absolute: 0 10*3/uL (ref 0.0–0.2)
Basos: 1 %
EOS (ABSOLUTE): 0.2 10*3/uL (ref 0.0–0.4)
Eos: 3 %
Hematocrit: 44.9 % (ref 37.5–51.0)
Hemoglobin: 15.5 g/dL (ref 13.0–17.7)
Immature Grans (Abs): 0 10*3/uL (ref 0.0–0.1)
Immature Granulocytes: 0 %
Lymphocytes Absolute: 1.9 10*3/uL (ref 0.7–3.1)
Lymphs: 29 %
MCH: 30.6 pg (ref 26.6–33.0)
MCHC: 34.5 g/dL (ref 31.5–35.7)
MCV: 89 fL (ref 79–97)
Monocytes Absolute: 0.4 10*3/uL (ref 0.1–0.9)
Monocytes: 6 %
Neutrophils Absolute: 3.8 10*3/uL (ref 1.4–7.0)
Neutrophils: 61 %
Platelets: 118 10*3/uL — ABNORMAL LOW (ref 150–450)
RBC: 5.06 x10E6/uL (ref 4.14–5.80)
RDW: 13.9 % (ref 11.6–15.4)
WBC: 6.4 10*3/uL (ref 3.4–10.8)

## 2021-08-14 LAB — URINALYSIS, ROUTINE W REFLEX MICROSCOPIC
Bilirubin, UA: NEGATIVE
Glucose, UA: NEGATIVE
Ketones, UA: NEGATIVE
Nitrite, UA: NEGATIVE
RBC, UA: NEGATIVE
Specific Gravity, UA: >=1.03 — AB (ref 1.005–1.030)
Urobilinogen, Ur: 0.2 mg/dL (ref 0.2–1.0)
pH, UA: 5.5 (ref 5.0–7.5)

## 2021-08-14 LAB — TSH: TSH: 2.57 u[IU]/mL (ref 0.450–4.500)

## 2021-08-20 ENCOUNTER — Other Ambulatory Visit: Payer: Self-pay

## 2021-08-20 ENCOUNTER — Ambulatory Visit: Payer: Self-pay | Admitting: Internal Medicine

## 2021-08-20 ENCOUNTER — Encounter: Payer: Self-pay | Admitting: Internal Medicine

## 2021-08-20 VITALS — BP 146/84 | HR 72 | Ht 68.0 in | Wt 247.4 lb

## 2021-08-20 DIAGNOSIS — I10 Essential (primary) hypertension: Secondary | ICD-10-CM

## 2021-08-20 DIAGNOSIS — Z794 Long term (current) use of insulin: Secondary | ICD-10-CM

## 2021-08-20 DIAGNOSIS — E119 Type 2 diabetes mellitus without complications: Secondary | ICD-10-CM

## 2021-08-20 MED ORDER — NITROGLYCERIN 0.4 MG SL SUBL
0.4000 mg | SUBLINGUAL_TABLET | SUBLINGUAL | 3 refills | Status: DC | PRN
  Filled 2021-08-20: qty 25, 1d supply, fill #0
  Filled 2021-08-26: qty 25, 25d supply, fill #0

## 2021-08-20 NOTE — Progress Notes (Signed)
? ?Established Patient Office Visit ? ?Subjective:  ?Patient ID: Michael Mack, male    DOB: June 29, 1960  Age: 61 y.o. MRN: [id number] ? ?CC:  ?Chief Complaint  ?Patient presents with  ? Follow-up  ?  Follow-up from endocrinology consult  ? ? ?HPI ?Michael Mack is a 61 y/o male who presents for follow-up from endocrinology clinic. Patient readings at home are between 106-130 in the mornings before breakfast. During the day his sugars are running 160-170. Denies any low readings below 100. Denies readings above 200 at dinner. Lantus 50 units at night apidra 30 units tid. Reduce's to 20 if meal is small.  ? ?Asked patient to occasionally check his sugars at night when he gets up to use the restroom.  ? ?Past Medical History:  ?Diagnosis Date  ? Bulging lumbar disc   ? Chronic pain   ? Diabetes mellitus without complication ([place])   ? GERD (gastroesophageal reflux disease)   ? Hepatic encephalopathy   ? Hepatitis C   ? Hepatocellular carcinoma ([place])   ? Hypertension   ? Migraines   ? Morbid obesity ([place])   ? Thrombocytopenia ([place])   ? ? ?Past Surgical History:  ?Procedure Laterality Date  ? KNEE SURGERY    ? Right Knee x 3, Left Knee x 1  ? ? ?Family History  ?Problem Relation Age of Onset  ? COPD Mother   ? Cancer Mother   ? Heart attack Father 25  ? ? ?Social History  ? ?Socioeconomic History  ? Marital status: Married  ?  Spouse name: Coda Mathey  ? Number of children: 0  ? Years of education: Not on file  ? Highest education level: 8th grade  ?Occupational History  ? Occupation: unemployed  ?Tobacco Use  ? Smoking status: Never  ? Smokeless tobacco: Never  ?Vaping Use  ? Vaping Use: Never used  ?Substance and Sexual Activity  ? Alcohol use: No  ? Drug use: No  ? Sexual activity: Yes  ?Other Topics Concern  ? Not on file  ?Social History Narrative  ? Dental problems- Already went to [facility] dental care program that we referred him too. Visited [facility] program but was too expensive.  ? ?Social Determinants of Health   ? ?Financial Resource Strain: Not on file  ?Food Insecurity: Not on file  ?Transportation Needs: Not on file  ?Physical Activity: Not on file  ?Stress: Not on file  ?Social Connections: Not on file  ?Intimate Partner Violence: Not on file  ? ? ?Outpatient Medications Prior to Visit  ?Medication Sig Dispense Refill  ? acetaminophen (TYLENOL) 500 MG tablet Take 500 mg by mouth as needed for headache. As needed    ? amitriptyline (ELAVIL) 50 MG tablet 100 mg at bedtime. (Patient not taking: Reported on 08/12/2021)    ? amphetamine-dextroamphetamine (ADDERALL) 20 MG tablet Take 20 mg by mouth daily.    ? Beta Carotene (VITAMIN A) 25000 UNIT capsule Take 25,000 Units by mouth daily.    ? Cholecalciferol 25 MCG (1000 UT) tablet Take 1,000 Units by mouth daily. (Patient not taking: Reported on 08/12/2021)    ? hydrochlorothiazide (HYDRODIURIL) 25 MG tablet Take 25 mg by mouth daily.    ? insulin glulisine (APIDRA SOLOSTAR) 100 UNIT/ML Solostar Pen Inject 30 Units into the skin 3 (three) times daily before meals. 27 mL 3  ? insulin lispro (HUMALOG KWIKPEN) 100 UNIT/ML KwikPen Inject 30 Units into the skin 3 (three) times daily before meals. (Patient taking differently:  Inject 30 Units into the skin 3 (three) times daily before meals. Taking to bridge until Apidra available) 15 mL 3  ? Insulin Pen Needle 32G X 4 MM MISC Use as directed with Insulin 100 each 11  ? lactulose (CHRONULAC) 10 GM/15ML solution TAKE 30ML BY MOUTH 3 TIMES DAILY. 2700 mL 11  ? LANTUS SOLOSTAR 100 UNIT/ML Solostar Pen Inject 50 Units into the skin daily. 45 mL 0  ? lisinopril (ZESTRIL) 40 MG tablet Take 40 mg by mouth daily.    ? methadone (DOLOPHINE) 10 MG tablet 30 mg every 8 (eight) hours as needed for moderate pain or severe pain.    ? multivitamin-iron-minerals-folic acid (CENTRUM) chewable tablet Chew 1 tablet by mouth daily.    ? naloxone (NARCAN) nasal spray 4 mg/0.1 mL Place into the nose.    ? nitroGLYCERIN (NITROSTAT) 0.4 MG SL tablet Place  1 tablet (0.4 mg total) under the tongue every 5 (five) minutes x 3 doses as needed for up to 3 days for chest pain. 6 tablet 12  ? Oxycodone HCl 20 MG TABS Take 1 tablet by mouth every 4 (four) hours as needed. Prescribed by pain management    ? XIFAXAN 550 MG TABS tablet Take 1 tablet (550 mg total) by mouth 2 (two) times daily. 180 tablet 3  ? ?No facility-administered medications prior to visit.  ? ? ?No Known Allergies ? ?ROS ?Review of Systems ? ?  ?Objective:  ?  ?Physical Exam ? ?BP (!) 146/84 (BP Location: Left Arm, Patient Position: Sitting, Cuff Size: Large)   Pulse 72   Ht _0  (1.727 m)   Wt 247 lb 6.4 oz (112.2 kg)   SpO2 93%   BMI 37.62 kg/m?  ?Wt Readings from Last 3 Encounters:  ?08/20/21 247 lb 6.4 oz (112.2 kg)  ?08/13/21 248 lb 3.2 oz (112.6 kg)  ?08/12/21 249 lb 11.2 oz (113.3 kg)  ? ? ? ?Health Maintenance Due  ?Topic Date Due  ? FOOT EXAM  Never done  ? Zoster Vaccines- Shingrix (1 of 2) Never done  ? COLONOSCOPY (Pts 45-48yr Insurance coverage will need to be confirmed)  Never done  ? COVID-19 Vaccine (4 - Booster) 08/23/2020  ? INFLUENZA VACCINE  01/20/2021  ? ? ?There are no preventive care reminders to display for this patient. ? ?Lab Results  ?Component Value Date  ? TSH 2.570 08/13/2021  ? ?Lab Results  ?Component Value Date  ? WBC 6.4 08/13/2021  ? HGB 15.5 08/13/2021  ? HCT 44.9 08/13/2021  ? MCV 89 08/13/2021  ? PLT 118 (L) 08/13/2021  ? ?Lab Results  ?Component Value Date  ? NA 140 08/13/2021  ? K 3.9 08/13/2021  ? CO2 21 08/13/2021  ? GLUCOSE 139 (H) 08/13/2021  ? BUN 18 08/13/2021  ? CREATININE 0.80 08/13/2021  ? BILITOT 0.8 08/13/2021  ? ALKPHOS 69 08/13/2021  ? AST 29 08/13/2021  ? ALT 25 08/13/2021  ? PROT 7.5 08/13/2021  ? ALBUMIN 4.3 08/13/2021  ? CALCIUM 9.3 08/13/2021  ? ANIONGAP 3 (L) 07/25/2021  ? EGFR 101 08/13/2021  ? ?Lab Results  ?Component Value Date  ? CHOL 154 08/13/2021  ? ?Lab Results  ?Component Value Date  ? HDL 61 08/13/2021  ? ?Lab Results  ?Component  Value Date  ? [id number] 08/13/2021  ? ?Lab Results  ?Component Value Date  ? TRIG 113 08/13/2021  ? ?Lab Results  ?Component Value Date  ? CHOLHDL 2.5 08/13/2021  ? ?Lab Results  ?  Component Value Date  ? HGBA1C 6.5 (H) 08/13/2021  ? ? ?  ?Assessment & Plan:  ? ?Problem List Items Addressed This Visit   ?None ? ?1. Type 2 diabetes mellitus without complication, with long-term current use of insulin ([place]) ?Pt was seen in the diabetic endocrine clinic and insulin doses were reviewed. On his last visit with [place] we lowered some of the doses and he has been stable on that regimen. Reports sugars before breakfast run between 100-120 and no readings during the day over 200 or under 100. Plan to continue this program and recheck with him with an a1c in four weeks. Has follow up with endocrinology at [facility] in May. ? ?2. Hypertension ?Blood pressure at target with current medication. ? ?No orders of the defined types were placed in this encounter. ? ? ?Follow-up: Follow-up in 4 weeks   ? ? ?Dede Query, CMA ?

## 2021-08-21 ENCOUNTER — Other Ambulatory Visit: Payer: Self-pay

## 2021-08-21 ENCOUNTER — Telehealth: Payer: Self-pay | Admitting: Pharmacist

## 2021-08-21 NOTE — Telephone Encounter (Signed)
08/21/2021 10:19:37 AM - Apidra & Lantus pending ?-- Arletha Pili - Thursday, August 21, 2021 10:18 AM --  ?Received dr signed portion for Apidra & Lantus. Holding for pt signed portion, POI & taxes ?

## 2021-08-22 ENCOUNTER — Other Ambulatory Visit: Payer: Self-pay

## 2021-08-26 ENCOUNTER — Other Ambulatory Visit: Payer: Self-pay | Admitting: Gerontology

## 2021-08-26 ENCOUNTER — Other Ambulatory Visit: Payer: Self-pay

## 2021-08-26 MED FILL — Lisinopril Tab 40 MG: ORAL | 90 days supply | Qty: 90 | Fill #0 | Status: CN

## 2021-08-26 MED FILL — Hydrochlorothiazide Tab 25 MG: ORAL | 90 days supply | Qty: 90 | Fill #0 | Status: AC

## 2021-08-26 MED FILL — Lisinopril Tab 40 MG: ORAL | 90 days supply | Qty: 90 | Fill #0 | Status: AC

## 2021-08-26 MED FILL — Hydrochlorothiazide Tab 25 MG: ORAL | 90 days supply | Qty: 90 | Fill #0 | Status: CN

## 2021-08-27 ENCOUNTER — Other Ambulatory Visit: Payer: Self-pay

## 2021-08-29 ENCOUNTER — Other Ambulatory Visit: Payer: Self-pay | Admitting: Internal Medicine

## 2021-08-29 ENCOUNTER — Other Ambulatory Visit: Payer: Self-pay

## 2021-08-29 DIAGNOSIS — E119 Type 2 diabetes mellitus without complications: Secondary | ICD-10-CM

## 2021-09-01 ENCOUNTER — Other Ambulatory Visit: Payer: Self-pay | Admitting: Gerontology

## 2021-09-01 ENCOUNTER — Other Ambulatory Visit: Payer: Self-pay

## 2021-09-01 DIAGNOSIS — E119 Type 2 diabetes mellitus without complications: Secondary | ICD-10-CM

## 2021-09-02 ENCOUNTER — Other Ambulatory Visit: Payer: Self-pay

## 2021-09-04 ENCOUNTER — Other Ambulatory Visit: Payer: Self-pay

## 2021-09-04 MED FILL — Insulin Glargine Soln Pen-Injector 100 Unit/ML: SUBCUTANEOUS | 60 days supply | Qty: 30 | Fill #0 | Status: AC

## 2021-09-05 ENCOUNTER — Other Ambulatory Visit: Payer: Self-pay

## 2021-09-23 ENCOUNTER — Other Ambulatory Visit: Payer: Self-pay

## 2021-09-23 DIAGNOSIS — K7460 Unspecified cirrhosis of liver: Secondary | ICD-10-CM

## 2021-09-24 ENCOUNTER — Other Ambulatory Visit: Payer: Self-pay

## 2021-09-24 ENCOUNTER — Ambulatory Visit: Payer: Self-pay | Admitting: Internal Medicine

## 2021-09-24 ENCOUNTER — Encounter: Payer: Self-pay | Admitting: Internal Medicine

## 2021-09-24 VITALS — BP 143/79 | HR 83 | Temp 98.0°F | Ht 67.0 in | Wt 253.1 lb

## 2021-09-24 DIAGNOSIS — I10 Essential (primary) hypertension: Secondary | ICD-10-CM

## 2021-09-24 DIAGNOSIS — E119 Type 2 diabetes mellitus without complications: Secondary | ICD-10-CM

## 2021-09-24 DIAGNOSIS — K7460 Unspecified cirrhosis of liver: Secondary | ICD-10-CM

## 2021-09-24 NOTE — Progress Notes (Signed)
? ?Established Patient Office Visit ? ?Subjective:  ?Patient ID: Michael Mack, male    DOB: Sep 25, 1960  Age: 61 y.o. MRN: [id number] ? ?CC:  ?Chief Complaint  ?Patient presents with  ? Follow-up  ? ? ?HPI ?Michael Mack is a 61 y/o male who presents for routine follow-up. He reports that he checks his sugar about 4 times a day usually run between 120-170. Sometimes around 200. Has not been checking at night time.  ? ?Past Medical History:  ?Diagnosis Date  ? Bulging lumbar disc   ? Chronic pain   ? Diabetes mellitus without complication ([place])   ? GERD (gastroesophageal reflux disease)   ? Hepatic encephalopathy ([place])   ? Hepatitis C   ? Hepatocellular carcinoma ([place])   ? Hypertension   ? Migraines   ? Morbid obesity ([place])   ? Thrombocytopenia ([place])   ? ? ?Past Surgical History:  ?Procedure Laterality Date  ? KNEE SURGERY    ? Right Knee x 3, Left Knee x 1  ? ? ?Family History  ?Problem Relation Age of Onset  ? COPD Mother   ? Cancer Mother   ? Heart attack Father 15  ? ? ?Social History  ? ?Socioeconomic History  ? Marital status: Married  ?  Spouse name: Urias Sheek  ? Number of children: 0  ? Years of education: Not on file  ? Highest education level: 8th grade  ?Occupational History  ? Occupation: unemployed  ?Tobacco Use  ? Smoking status: Never  ? Smokeless tobacco: Never  ?Vaping Use  ? Vaping Use: Never used  ?Substance and Sexual Activity  ? Alcohol use: No  ? Drug use: No  ? Sexual activity: Yes  ?Other Topics Concern  ? Not on file  ?Social History Narrative  ? Dental problems- Already went to [facility] care program that we referred him too. Visited [facility] program but was too expensive.  ? ?Social Determinants of Health  ? ?Financial Resource Strain: Not on file  ?Food Insecurity: Not on file  ?Transportation Needs: Not on file  ?Physical Activity: Not on file  ?Stress: Not on file  ?Social Connections: Not on file  ?Intimate Partner Violence: Not on file  ? ? ?Outpatient Medications Prior to Visit   ?Medication Sig Dispense Refill  ? acetaminophen (TYLENOL) 500 MG tablet Take 500 mg by mouth as needed for headache. As needed    ? Beta Carotene (VITAMIN A) 25000 UNIT capsule Take 25,000 Units by mouth daily.    ? hydrochlorothiazide (HYDRODIURIL) 25 MG tablet TAKE ONE TABLET BY MOUTH ONCE EVERY DAY. 90 tablet 0  ? insulin glulisine (APIDRA SOLOSTAR) 100 UNIT/ML Solostar Pen Inject 30 Units into the skin 3 (three) times daily before meals. 27 mL 3  ? lactulose (CHRONULAC) 10 GM/15ML solution TAKE 30ML BY MOUTH 3 TIMES DAILY. 2700 mL 11  ? LANTUS SOLOSTAR 100 UNIT/ML Solostar Pen INJECT 50 UNITS UNDER THE SKIN ONCE DAILY AT BEDTIME. 45 mL 0  ? lisinopril (ZESTRIL) 40 MG tablet TAKE ONE TABLET BY MOUTH ONCE EVERY DAY. 90 tablet 1  ? multivitamin-iron-minerals-folic acid (CENTRUM) chewable tablet Chew 1 tablet by mouth daily.    ? amitriptyline (ELAVIL) 50 MG tablet 100 mg at bedtime. (Patient not taking: Reported on 08/12/2021)    ? amphetamine-dextroamphetamine (ADDERALL) 20 MG tablet Take 20 mg by mouth daily.    ? Cholecalciferol 25 MCG (1000 UT) tablet Take 1,000 Units by mouth daily. (Patient not taking: Reported on 08/12/2021)    ?  insulin lispro (HUMALOG KWIKPEN) 100 UNIT/ML KwikPen Inject 30 Units into the skin 3 (three) times daily before meals. (Patient taking differently: Inject 30 Units into the skin 3 (three) times daily before meals. Taking to bridge until Apidra available) 15 mL 3  ? Insulin Pen Needle 32G X 4 MM MISC Use as directed with Insulin 100 each 11  ? methadone (DOLOPHINE) 10 MG tablet 30 mg every 8 (eight) hours as needed for moderate pain or severe pain.    ? naloxone (NARCAN) nasal spray 4 mg/0.1 mL Place into the nose.    ? nitroGLYCERIN (NITROSTAT) 0.4 MG SL tablet Place 1 tablet (0.4 mg total) under the tongue once every 5 (five) minutes for up to 3 doses as needed for chest pain. 25 tablet 3  ? Oxycodone HCl 20 MG TABS Take 1 tablet by mouth every 4 (four) hours as needed. Prescribed  by pain management    ? XIFAXAN 550 MG TABS tablet Take 1 tablet (550 mg total) by mouth 2 (two) times daily. 180 tablet 3  ? ?No facility-administered medications prior to visit.  ? ? ?No Known Allergies ? ?ROS ?Review of Systems ? ?  ?Objective:  ?  ?Physical Exam ? ?BP (!) 143/79 (BP Location: Left Arm, Patient Position: Sitting, Cuff Size: Large)   Pulse 83   Temp 98 ?F (36.7 ?C)   Ht '5\' 7"'  (1.702 m)   Wt 253 lb 1.6 oz (114.8 kg)   SpO2 91%   BMI 39.64 kg/m?  ?Wt Readings from Last 3 Encounters:  ?09/24/21 253 lb 1.6 oz (114.8 kg)  ?08/20/21 247 lb 6.4 oz (112.2 kg)  ?08/13/21 248 lb 3.2 oz (112.6 kg)  ? ? ? ?Health Maintenance Due  ?Topic Date Due  ? FOOT EXAM  Never done  ? Zoster Vaccines- Shingrix (1 of 2) Never done  ? COLONOSCOPY (Pts 45-44yr Insurance coverage will need to be confirmed)  Never done  ? COVID-19 Vaccine (4 - Booster) 08/23/2020  ? OPHTHALMOLOGY EXAM  09/06/2021  ? ? ?There are no preventive care reminders to display for this patient. ? ?Lab Results  ?Component Value Date  ? TSH 2.570 08/13/2021  ? ?Lab Results  ?Component Value Date  ? WBC 6.4 08/13/2021  ? HGB 15.5 08/13/2021  ? HCT 44.9 08/13/2021  ? MCV 89 08/13/2021  ? PLT 118 (L) 08/13/2021  ? ?Lab Results  ?Component Value Date  ? NA 140 08/13/2021  ? K 3.9 08/13/2021  ? CO2 21 08/13/2021  ? GLUCOSE 139 (H) 08/13/2021  ? BUN 18 08/13/2021  ? CREATININE 0.80 08/13/2021  ? BILITOT 0.8 08/13/2021  ? ALKPHOS 69 08/13/2021  ? AST 29 08/13/2021  ? ALT 25 08/13/2021  ? PROT 7.5 08/13/2021  ? ALBUMIN 4.3 08/13/2021  ? CALCIUM 9.3 08/13/2021  ? ANIONGAP 3 (L) 07/25/2021  ? EGFR 101 08/13/2021  ? ?Lab Results  ?Component Value Date  ? CHOL 154 08/13/2021  ? ?Lab Results  ?Component Value Date  ? HDL 61 08/13/2021  ? ?Lab Results  ?Component Value Date  ? LBruce73 08/13/2021  ? ?Lab Results  ?Component Value Date  ? TRIG 113 08/13/2021  ? ?Lab Results  ?Component Value Date  ? CHOLHDL 2.5 08/13/2021  ? ?Lab Results  ?Component Value  Date  ? HGBA1C 6.5 (H) 08/13/2021  ? ? ?  ?Assessment & Plan:  ? ?Problem List Items Addressed This Visit   ? ?  ? Cardiovascular and Mediastinum  ? Hypertension  ?  ?  Digestive  ? Cirrhosis ([place])  ?  ? Endocrine  ? Diabetes ([place]) - Primary  ? Relevant Orders  ? POCT HgB A1C  ? ? ?No orders of the defined types were placed in this encounter. ? ?1. Type 2 diabetes mellitus without complication, with long-term current use of insulin ([place]) ?Checks sugars 3-4 times a day. Readings appear to be quote stable and in good range. Did not get any nighttime readings. Was unable to get a1c today during visit due to machine malfunction. A1c being sent to reference lab today. No change in his medications recommended.  ? ?2. Primary hypertension ?Close to target. No medication changes. ? ?3. Cirrhosis of liver without ascites, unspecified hepatic cirrhosis type ([place]) ?Being followed by [facility]. Received a fax request for labs prior to his appointment next month which we drew today.  ? ?With my retirement in May, patient is being referred back to his primary care physician at [facility] for care of his diabetes and hypertension. Patient was informed he can still receive labs and diabetic supplies through our clinic as long as he met qualifications.  ? ?Follow-up: No follow-ups on file.  ? ? ?Dede Query, CMA ?

## 2021-09-25 LAB — COMPREHENSIVE METABOLIC PANEL
ALT: 22 IU/L (ref 0–44)
AST: 27 IU/L (ref 0–40)
Albumin/Globulin Ratio: 1.4 (ref 1.2–2.2)
Albumin: 3.9 g/dL (ref 3.8–4.9)
Alkaline Phosphatase: 71 IU/L (ref 44–121)
BUN/Creatinine Ratio: 18 (ref 10–24)
BUN: 13 mg/dL (ref 8–27)
Bilirubin Total: 0.5 mg/dL (ref 0.0–1.2)
CO2: 22 mmol/L (ref 20–29)
Calcium: 9.2 mg/dL (ref 8.6–10.2)
Chloride: 101 mmol/L (ref 96–106)
Creatinine, Ser: 0.72 mg/dL — ABNORMAL LOW (ref 0.76–1.27)
Globulin, Total: 2.7 g/dL (ref 1.5–4.5)
Glucose: 156 mg/dL — ABNORMAL HIGH (ref 70–99)
Potassium: 3.7 mmol/L (ref 3.5–5.2)
Sodium: 139 mmol/L (ref 134–144)
Total Protein: 6.6 g/dL (ref 6.0–8.5)
eGFR: 105 mL/min/{1.73_m2} (ref >59)

## 2021-09-25 LAB — CBC WITH DIFFERENTIAL/PLATELET
Basophils Absolute: 0 10*3/uL (ref 0.0–0.2)
Basos: 1 %
EOS (ABSOLUTE): 0.1 10*3/uL (ref 0.0–0.4)
Eos: 3 %
Hematocrit: 38 % (ref 37.5–51.0)
Hemoglobin: 13.4 g/dL (ref 13.0–17.7)
Immature Grans (Abs): 0 10*3/uL (ref 0.0–0.1)
Immature Granulocytes: 0 %
Lymphocytes Absolute: 1.1 10*3/uL (ref 0.7–3.1)
Lymphs: 27 %
MCH: 31.5 pg (ref 26.6–33.0)
MCHC: 35.3 g/dL (ref 31.5–35.7)
MCV: 89 fL (ref 79–97)
Monocytes Absolute: 0.2 10*3/uL (ref 0.1–0.9)
Monocytes: 5 %
Neutrophils Absolute: 2.6 10*3/uL (ref 1.4–7.0)
Neutrophils: 64 %
Platelets: 104 10*3/uL — ABNORMAL LOW (ref 150–450)
RBC: 4.26 x10E6/uL (ref 4.14–5.80)
RDW: 13.7 % (ref 11.6–15.4)
WBC: 4.1 10*3/uL (ref 3.4–10.8)

## 2021-09-25 LAB — HEPATIC FUNCTION PANEL: Bilirubin, Direct: 0.19 mg/dL (ref 0.00–0.40)

## 2021-09-25 LAB — PROTIME-INR
INR: 1.1 (ref 0.9–1.2)
Prothrombin Time: 11.5 s (ref 9.1–12.0)

## 2021-09-25 LAB — AFP TUMOR MARKER: AFP, Serum, Tumor Marker: 3.4 ng/mL (ref 0.0–8.4)

## 2021-09-25 LAB — HEMOGLOBIN A1C
Est. average glucose Bld gHb Est-mCnc: 143 mg/dL
Hgb A1c MFr Bld: 6.6 % — ABNORMAL HIGH (ref 4.8–5.6)

## 2021-09-25 LAB — SPECIMEN STATUS REPORT

## 2021-09-29 ENCOUNTER — Telehealth: Payer: Self-pay | Admitting: Pharmacist

## 2021-09-29 NOTE — Telephone Encounter (Signed)
09/29/2021 11:55:08 AM - Apidra/Lantus renewal fax to [employer] ?-- Arletha Pili - Monday, September 29, 2021 11:53 AM --Jeralene Peters renewal for [facility]. ?

## 2021-10-03 ENCOUNTER — Ambulatory Visit: Payer: Self-pay | Admitting: Pharmacy Technician

## 2021-10-03 DIAGNOSIS — Z79899 Other long term (current) drug therapy: Secondary | ICD-10-CM

## 2021-10-03 NOTE — Progress Notes (Signed)
Assisted patient with paperwork for re-certification and for financial assistance for [facility].  Sent financial assistance application along with financial documentation to Designer, multimedia at [employer]. ? ?Patient is to bring a previous [facility] bill along with previous approval for financial assistance to [facility] for me to fax to Designer, multimedia at [facility]. ? ?Assisted patient with starting the process to apply for financial assistance with [facility]. ? ?Received updated proof of income.  Patient eligible to receive medication assistance at [facility] until time for re-certification in 9935, and as long as eligibility requirements continue to be met. ? ?Michael Mack ?Care Manager ?[facility]  ?

## 2021-10-09 ENCOUNTER — Other Ambulatory Visit: Payer: Self-pay

## 2021-10-10 ENCOUNTER — Other Ambulatory Visit: Payer: Self-pay

## 2021-10-20 ENCOUNTER — Other Ambulatory Visit: Payer: Self-pay | Admitting: Gerontology

## 2021-10-20 ENCOUNTER — Other Ambulatory Visit: Payer: Self-pay

## 2021-10-20 DIAGNOSIS — E119 Type 2 diabetes mellitus without complications: Secondary | ICD-10-CM

## 2021-10-20 MED FILL — Insulin Glargine Soln Pen-Injector 100 Unit/ML: SUBCUTANEOUS | 60 days supply | Qty: 30 | Fill #1 | Status: CN

## 2021-10-21 ENCOUNTER — Other Ambulatory Visit: Payer: Self-pay

## 2021-10-21 MED FILL — Insulin Glargine Soln Pen-Injector 100 Unit/ML: SUBCUTANEOUS | Qty: 45 | Fill #0 | Status: CN

## 2021-10-22 ENCOUNTER — Other Ambulatory Visit: Payer: Self-pay

## 2021-10-24 ENCOUNTER — Other Ambulatory Visit: Payer: Self-pay

## 2021-10-29 ENCOUNTER — Other Ambulatory Visit: Payer: Self-pay

## 2021-10-29 ENCOUNTER — Ambulatory Visit: Payer: Self-pay | Admitting: Internal Medicine

## 2021-10-29 VITALS — BP 110/72 | HR 65 | Ht 68.0 in | Wt 247.3 lb

## 2021-10-29 DIAGNOSIS — I10 Essential (primary) hypertension: Secondary | ICD-10-CM

## 2021-10-29 DIAGNOSIS — K7460 Unspecified cirrhosis of liver: Secondary | ICD-10-CM

## 2021-10-29 DIAGNOSIS — E119 Type 2 diabetes mellitus without complications: Secondary | ICD-10-CM

## 2021-11-04 ENCOUNTER — Ambulatory Visit: Payer: Self-pay | Admitting: "Endocrinology

## 2021-11-04 ENCOUNTER — Other Ambulatory Visit: Payer: Self-pay

## 2021-11-04 VITALS — BP 115/69 | HR 77 | Temp 98.7°F | Wt 248.9 lb

## 2021-11-04 DIAGNOSIS — E119 Type 2 diabetes mellitus without complications: Secondary | ICD-10-CM

## 2021-11-04 NOTE — Patient Instructions (Addendum)
It was so nice to see you today!  ? ?Here are the key points for today: ?Decrease your long acting insulin (Lantus) to 45 units per day ?Follow up in 3 months ?

## 2021-11-04 NOTE — Progress Notes (Signed)
Follow up Diabetes/ Endocrine [facility]     Patient ID: Michael Mack, male   DOB: 12-13-1960, 61 y.o.   MRN: [id number] Assessment/Plan:  [name] is a 61 y.o. male who is seen in follow up for Type 2 diabetes mellitus without complication, with long-term current use of insulin ([place]) [E11.9, Z79.4] at the request of Michael Millers, MD. His A1c is at a similar level, 6.5 (2/22) to 6.6 (4/5) with a elevated microalbumin to creatinine ratio of 282.5 from 2019 and a decreased creatinine of .72. His A1c is at goal and we would like to decrease his insulin burden but with his concurrent liver disease, it is important to titrate his insulin therapy carefully to prevent complications.   Therefore, he will decrease his long acting insulin (lantus) from 50 to 45 units per day and we will follow up in 3 months. It is also important to schedule an ophthalmology appointment for an updated diabetic retinopathy screen and an updated microalbumin to creatinine ratio.      Patient Instructions  It was so nice to see you today!   Here are the key points for today: Decrease your long acting insulin (Lantus) to 45 units per day Follow up in 3 months   No orders of the defined types were placed in this encounter.    Subjective:  Michael Mack is a 61 year old male with a PMH of GERD, Hep C, HCC, HTN and Cirrhosis who is presenting today for T2DM management follow up. His A1c has stayed at the same level from Feb to April (6.5 (2/22) -> 6.6 (4/5)). He has been checking his blood glucose three to four times a day and brought his meter to the appointment, where the readings ranged from 100 to 200 mg/dl. He does not check his blood pressure at home. For hyperglycemia symptoms, he denies polydipsia, weight loss, numbness/tingling, chest pain, shortness of breath and changes in vision. He reports that he wakes up close to the morning time to use the bathroom, at 4 or 5am. For hypoglycemia symptoms, he denies  shakes/chills or feeling of passing out.   He decreased his mealtime insulin from 30 units to 25 units of glulisine (3x before meals) and he is still taking Lantus, 50 units daily at night.  His diet is usually chicken, hamburgers and meatloaf. His exercise consists of working in the yard. He does not smoke, use alcohol or take other drugs.   Review of Systems  Respiratory:  Negative for shortness of breath.   Cardiovascular:  Negative for chest pain.   Michael Mack  has a past medical history of Bulging lumbar disc, Chronic pain, Diabetes mellitus without complication ([place]), GERD (gastroesophageal reflux disease), Hepatic encephalopathy ([place]), Hepatitis C, Hepatocellular carcinoma ([place]), Hypertension, Migraines, Morbid obesity ([place]), and Thrombocytopenia ([place]).  Family History, Social History, current Medications and allergies reviewed and updated in Epic.  Objective:   Weight: 248.9 lbs SP02: 90% HR: 77 BP: 115/69 Temp: 98.7  Physical Exam Constitutional:      Appearance: Normal appearance.  Cardiovascular:     Rate and Rhythm: Normal rate and regular rhythm.     Pulses:          Radial pulses are 2+ on the left side.     Heart sounds: Normal heart sounds, S1 normal and S2 normal.  Pulmonary:     Effort: Pulmonary effort is normal.     Breath sounds: Normal breath sounds.  Musculoskeletal:  Right lower leg: No edema.     Left lower leg: No edema.  Feet:     Comments: Intact sensation in bilateral feet to a 10g monofilament needle Neurological:     Mental Status: He is alert.   Data : I have personally reviewed pertinent labs and imaging studies, if indicated,  with the patient in clinic today.   Lab Orders  No laboratory test(s) ordered today    HC Readings from Last 3 Encounters:  No data found for [facility]    Wt Readings from Last 3 Encounters:  10/29/21 247 lb 4.8 oz (112.2 kg)  09/24/21 253 lb 1.6 oz (114.8 kg)  08/20/21 247 lb 6.4 oz (112.2 kg)

## 2021-11-11 ENCOUNTER — Telehealth: Payer: Self-pay | Admitting: Pharmacy Technician

## 2021-11-11 NOTE — Telephone Encounter (Signed)
Patient to sign Xifaxin PAP application at [employer].  Jacquelynn Cree Patient Advocate Specialist [place]

## 2021-11-12 ENCOUNTER — Other Ambulatory Visit: Payer: Self-pay

## 2021-11-20 ENCOUNTER — Other Ambulatory Visit: Payer: Self-pay | Admitting: Gerontology

## 2021-11-20 ENCOUNTER — Other Ambulatory Visit: Payer: Self-pay

## 2021-11-20 MED ORDER — HYDROCHLOROTHIAZIDE 25 MG PO TABS
ORAL_TABLET | Freq: Every day | ORAL | 0 refills | Status: DC
Start: 2021-11-20 — End: 2022-02-25
  Filled 2021-11-20: qty 90, 90d supply, fill #0

## 2021-11-20 MED FILL — Lisinopril Tab 40 MG: ORAL | 90 days supply | Qty: 90 | Fill #0 | Status: AC

## 2021-12-04 ENCOUNTER — Other Ambulatory Visit: Payer: Self-pay

## 2021-12-04 MED ORDER — INSULIN GLARGINE 100 UNIT/ML SOLOSTAR PEN: PEN_INJECTOR | SUBCUTANEOUS | 3 refills | Status: DC

## 2021-12-05 ENCOUNTER — Other Ambulatory Visit: Payer: Self-pay

## 2021-12-19 ENCOUNTER — Other Ambulatory Visit: Payer: Self-pay

## 2021-12-24 ENCOUNTER — Other Ambulatory Visit: Payer: Self-pay

## 2021-12-24 DIAGNOSIS — I10 Essential (primary) hypertension: Secondary | ICD-10-CM

## 2021-12-24 DIAGNOSIS — K7460 Unspecified cirrhosis of liver: Secondary | ICD-10-CM

## 2021-12-24 DIAGNOSIS — Z794 Long term (current) use of insulin: Secondary | ICD-10-CM

## 2021-12-24 MED FILL — Insulin Glargine Soln Pen-Injector 100 Unit/ML: SUBCUTANEOUS | 90 days supply | Qty: 45 | Fill #0 | Status: AC

## 2021-12-25 ENCOUNTER — Other Ambulatory Visit: Payer: Self-pay

## 2021-12-25 LAB — URINALYSIS, ROUTINE W REFLEX MICROSCOPIC
Bilirubin, UA: NEGATIVE
Ketones, UA: NEGATIVE
Nitrite, UA: NEGATIVE
RBC, UA: NEGATIVE
Specific Gravity, UA: 1.025 (ref 1.005–1.030)
Urobilinogen, Ur: 0.2 mg/dL (ref 0.2–1.0)
pH, UA: 5.5 (ref 5.0–7.5)

## 2021-12-25 LAB — COMPREHENSIVE METABOLIC PANEL
ALT: 13 IU/L (ref 0–44)
AST: 20 IU/L (ref 0–40)
Albumin/Globulin Ratio: 1.4 (ref 1.2–2.2)
Albumin: 4.2 g/dL (ref 3.8–4.9)
Alkaline Phosphatase: 74 IU/L (ref 44–121)
BUN/Creatinine Ratio: 24 (ref 10–24)
BUN: 21 mg/dL (ref 8–27)
Bilirubin Total: 0.6 mg/dL (ref 0.0–1.2)
CO2: 23 mmol/L (ref 20–29)
Calcium: 9.9 mg/dL (ref 8.6–10.2)
Chloride: 100 mmol/L (ref 96–106)
Creatinine, Ser: 0.87 mg/dL (ref 0.76–1.27)
Globulin, Total: 3 g/dL (ref 1.5–4.5)
Glucose: 166 mg/dL — ABNORMAL HIGH (ref 70–99)
Potassium: 3.8 mmol/L (ref 3.5–5.2)
Sodium: 141 mmol/L (ref 134–144)
Total Protein: 7.2 g/dL (ref 6.0–8.5)
eGFR: 99 mL/min/{1.73_m2} (ref >59)

## 2021-12-25 LAB — CBC WITH DIFFERENTIAL/PLATELET
Basophils Absolute: 0 10*3/uL (ref 0.0–0.2)
Basos: 1 %
EOS (ABSOLUTE): 0.3 10*3/uL (ref 0.0–0.4)
Eos: 4 %
Hematocrit: 45 % (ref 37.5–51.0)
Hemoglobin: 15.5 g/dL (ref 13.0–17.7)
Immature Grans (Abs): 0 10*3/uL (ref 0.0–0.1)
Immature Granulocytes: 0 %
Lymphocytes Absolute: 1.8 10*3/uL (ref 0.7–3.1)
Lymphs: 28 %
MCH: 31.4 pg (ref 26.6–33.0)
MCHC: 34.4 g/dL (ref 31.5–35.7)
MCV: 91 fL (ref 79–97)
Monocytes Absolute: 0.4 10*3/uL (ref 0.1–0.9)
Monocytes: 6 %
Neutrophils Absolute: 4 10*3/uL (ref 1.4–7.0)
Neutrophils: 61 %
Platelets: 128 10*3/uL — ABNORMAL LOW (ref 150–450)
RBC: 4.93 x10E6/uL (ref 4.14–5.80)
RDW: 13.3 % (ref 11.6–15.4)
WBC: 6.6 10*3/uL (ref 3.4–10.8)

## 2021-12-25 LAB — LIPID PANEL
Chol/HDL Ratio: 3.6 ratio (ref 0.0–5.0)
Cholesterol, Total: 181 mg/dL (ref 100–199)
HDL: 50 mg/dL (ref >39)
LDL Chol Calc (NIH): 112 mg/dL — ABNORMAL HIGH (ref 0–99)
Triglycerides: 104 mg/dL (ref 0–149)
VLDL Cholesterol Cal: 19 mg/dL (ref 5–40)

## 2021-12-25 LAB — HEMOGLOBIN A1C
Est. average glucose Bld gHb Est-mCnc: 160 mg/dL
Hgb A1c MFr Bld: 7.2 % — ABNORMAL HIGH (ref 4.8–5.6)

## 2021-12-25 LAB — MICROSCOPIC EXAMINATION
Bacteria, UA: NONE SEEN
Casts: NONE SEEN /lpf
WBC, UA: >30 /hpf — AB (ref 0–5)

## 2021-12-25 LAB — TSH: TSH: 2.62 u[IU]/mL (ref 0.450–4.500)

## 2021-12-26 ENCOUNTER — Other Ambulatory Visit: Payer: Self-pay

## 2021-12-30 ENCOUNTER — Other Ambulatory Visit: Payer: Self-pay

## 2021-12-31 ENCOUNTER — Other Ambulatory Visit: Payer: Self-pay

## 2021-12-31 ENCOUNTER — Ambulatory Visit: Payer: Self-pay | Admitting: Adult Health

## 2021-12-31 ENCOUNTER — Encounter: Payer: Self-pay | Admitting: Gerontology

## 2021-12-31 VITALS — BP 116/73 | HR 83 | Temp 98.2°F | Resp 17 | Ht 67.0 in | Wt 244.3 lb

## 2021-12-31 DIAGNOSIS — K219 Gastro-esophageal reflux disease without esophagitis: Secondary | ICD-10-CM

## 2021-12-31 DIAGNOSIS — K7460 Unspecified cirrhosis of liver: Secondary | ICD-10-CM

## 2021-12-31 DIAGNOSIS — E1159 Type 2 diabetes mellitus with other circulatory complications: Secondary | ICD-10-CM

## 2021-12-31 DIAGNOSIS — E1169 Type 2 diabetes mellitus with other specified complication: Secondary | ICD-10-CM

## 2021-12-31 MED ORDER — OZEMPIC (0.25 OR 0.5 MG/DOSE) 2 MG/1.5ML ~~LOC~~ SOPN
0.2500 mg | PEN_INJECTOR | SUBCUTANEOUS | 0 refills | Status: DC
  Filled 2021-12-31: qty 4, 147d supply, fill #0
  Filled 2022-01-20: qty 1.5, 56d supply, fill #0

## 2021-12-31 MED ORDER — INSULIN GLARGINE 100 UNIT/ML SOLOSTAR PEN
50.0000 [IU] | PEN_INJECTOR | Freq: Every day | SUBCUTANEOUS | 3 refills | Status: DC
  Filled 2021-12-31: qty 45, 90d supply, fill #0
  Filled 2022-05-12: qty 90, 180d supply, fill #0

## 2021-12-31 NOTE — Progress Notes (Signed)
Patient: Michael Mack Male    DOB: 25-Feb-1961   61 y.o.   MRN: [id number] Visit Date: 12/31/2021  Today's Provider: Deforest Hoyles, NP   Chief Complaint  Patient presents with   Follow-up    Labs drawn 12/24/21   Diabetes   Subjective:  HPI:  This is a 61 year old male with a history of type 2 diabetes, hypertension, hyperlipidemia and morbid obesity who presents for routine follow-up.  He offers no complaints.  He has not been exercising daily as he should because of bilateral knee pain.  Bilateral knee pain is chronic.  He has had 2 knee replacements currently.  His hemoglobin A1c has been trending up.  He did not bring any blood glucose logs for review.  He denies changing his diet.  He is currently on insulin Premeal and at bedtime.  He denies any food insecurity.  His blood pressure is well controlled.  Does not routinely monitor his blood pressure at home.  He is still following up with the hepatologist.  He denies any jaundice, abdominal pain, nausea and vomiting.  He is compliant with all his medications.  He does not need any refills today. No Known Allergies Previous Medications   ACETAMINOPHEN (TYLENOL) 500 MG TABLET    Take 500 mg by mouth as needed for headache. As needed   AMITRIPTYLINE (ELAVIL) 50 MG TABLET    100 mg at bedtime.   BETA CAROTENE (VITAMIN A) 25000 UNIT CAPSULE    Take 25,000 Units by mouth daily.   CHOLECALCIFEROL 25 MCG (1000 UT) TABLET    Take 1,000 Units by mouth daily.   HYDROCHLOROTHIAZIDE (HYDRODIURIL) 25 MG TABLET    TAKE ONE TABLET BY MOUTH ONCE EVERY DAY.   INSULIN GLARGINE (LANTUS) 100 UNIT/ML SOLOSTAR PEN    Inject 50 units under the skin daily at bedtime   INSULIN GLULISINE (APIDRA SOLOSTAR) 100 UNIT/ML SOLOSTAR PEN    Inject 30 Units into the skin 3 (three) times daily before meals.   INSULIN PEN NEEDLE 32G X 4 MM MISC    Use as directed with Insulin   LACTULOSE (CHRONULAC) 10 GM/15ML SOLUTION    TAKE 30ML BY MOUTH 3 TIMES DAILY.    LISINOPRIL (ZESTRIL) 40 MG TABLET    TAKE ONE TABLET BY MOUTH ONCE EVERY DAY.   METHADONE (DOLOPHINE) 10 MG TABLET    30 mg every 8 (eight) hours as needed for moderate pain or severe pain.   MULTIVITAMIN-IRON-MINERALS-FOLIC ACID (CENTRUM) CHEWABLE TABLET    Chew 1 tablet by mouth daily.   NALOXONE (NARCAN) NASAL SPRAY 4 MG/0.1 ML    Place into the nose.   NITROGLYCERIN (NITROSTAT) 0.4 MG SL TABLET    Place 1 tablet (0.4 mg total) under the tongue once every 5 (five) minutes for up to 3 doses as needed for chest pain.   OXYCODONE HCL 20 MG TABS    Take 1 tablet by mouth every 4 (four) hours as needed. Prescribed by pain management   XIFAXAN 550 MG TABS TABLET    Take 1 tablet (550 mg total) by mouth 2 (two) times daily.    Review of Systems  Constitutional: Negative.   HENT: Negative.    Respiratory: Negative.    Cardiovascular: Negative.   Gastrointestinal: Negative.   Endocrine: Negative.   Musculoskeletal:  Positive for arthralgias (Bilateral knee pain).  Skin: Negative.   Neurological: Negative.   Psychiatric/Behavioral:  Negative for sleep disturbance.     Social History   Tobacco Use  Smoking status: Never   Smokeless tobacco: Never  Substance Use Topics   Alcohol use: Not Currently    Comment: drank on weekends when he was younger, hasn't drank in 20 years   Objective:   BP 116/73 (BP Location: Right Arm, Patient Position: Sitting, Cuff Size: Large)   Pulse 83   Temp 98.2 F (36.8 C) (Oral)   Resp 17   Ht '5\' 7"'$  (1.702 m)   Wt 244 lb 4.8 oz (110.8 kg)   SpO2 92%   BMI 38.26 kg/m   Physical Exam Vitals and nursing note reviewed.  Constitutional:      Appearance: Normal appearance. He is obese.  HENT:     Mouth/Throat:     Mouth: Mucous membranes are moist.  Eyes:     Extraocular Movements: Extraocular movements intact.     Conjunctiva/sclera: Conjunctivae normal.     Pupils: Pupils are equal, round, and reactive to light.  Cardiovascular:     Rate and  Rhythm: Normal rate and regular rhythm.     Pulses: Normal pulses.     Heart sounds: Normal heart sounds.  Pulmonary:     Effort: Pulmonary effort is normal.     Breath sounds: Normal breath sounds.  Abdominal:     General: Bowel sounds are normal. There is distension (central obesity).     Palpations: Abdomen is soft.  Musculoskeletal:        General: Tenderness (bilateral knee pain with flexion and extension) present.  Skin:    General: Skin is warm and dry.     Capillary Refill: Capillary refill takes less than 2 seconds.  Neurological:     General: No focal deficit present.     Mental Status: He is alert and oriented to person, place, and time.  Psychiatric:        Mood and Affect: Mood normal.     Assessment & Plan:   1. Obesity, Class III, BMI 40-49.9 (morbid obesity) ([place]) Patient remains morbidly obese with a BMI of 38.26.  We will add a GLP-1 agonist for both glycemic.  Patient is to continue low concentrated sweets and carbohydrate diet.  Home-based daily exercises that can be integrated into patient's daily routine have been reviewed with him.  2. Type 2 diabetes mellitus with hyperlipidemia (H[facility]) Hemoglobin A1c is trending up.  Will add a GLP-1 agonist Ozempic to patient's current regimen.  He is to return to the clinic. Reevaluation in 6 weeks.   3. Hypertension associated with diabetes ([place])  Blood pressures well controlled continue current regimen  4. Gastroesophageal reflux disease without esophagitis No acute symptoms.  Continue current medications  5. Cirrhosis of liver without ascites, unspecified hepatic cirrhosis type ([place]) Continue follow-up with hepatologist.  This note was prepared with [place] editorial omissions inevitable  Deforest Hoyles, NP   [facility]

## 2021-12-31 NOTE — Patient Instructions (Signed)
Semaglutide Injection What is this medication? SEMAGLUTIDE (SEM a GLOO tide) treats type 2 diabetes. It works by increasing insulin levels in your body, which decreases your blood sugar (glucose). It also reduces the amount of sugar released into the blood and slows down your digestion. It can also be used to lower the risk of heart attack and stroke in people with type 2 diabetes. Changes to diet and exercise are often combined with this medication. This medicine may be used for other purposes; ask your health care provider or pharmacist if you have questions. COMMON BRAND NAME(S): OZEMPIC What should I tell my care team before I take this medication? They need to know if you have any of these conditions: Endocrine tumors (MEN 2) or if someone in your family had these tumors Eye disease, vision problems History of pancreatitis Kidney disease Stomach problems Thyroid cancer or if someone in your family had thyroid cancer An unusual or allergic reaction to semaglutide, other medications, foods, dyes, or preservatives Pregnant or trying to get pregnant Breast-feeding How should I use this medication? This medication is for injection under the skin of your upper leg (thigh), stomach area, or upper arm. It is given once every week (every 7 days). You will be taught how to prepare and give this medication. Use exactly as directed. Take your medication at regular intervals. Do not take it more often than directed. If you use this medication with insulin, you should inject this medication and the insulin separately. Do not mix them together. Do not give the injections right next to each other. Change (rotate) injection sites with each injection. It is important that you put your used needles and syringes in a special sharps container. Do not put them in a trash can. If you do not have a sharps container, call your pharmacist or care team to get one. A special MedGuide will be given to you by the  pharmacist with each prescription and refill. Be sure to read this information carefully each time. This medication comes with INSTRUCTIONS FOR USE. Ask your pharmacist for directions on how to use this medication. Read the information carefully. Talk to your pharmacist or care team if you have questions. Talk to your care team about the use of this medication in children. Special care may be needed. Overdosage: If you think you have taken too much of this medicine contact a poison control center or emergency room at once. NOTE: This medicine is only for you. Do not share this medicine with others. What if I miss a dose? If you miss a dose, take it as soon as you can within 5 days after the missed dose. Then take your next dose at your regular weekly time. If it has been longer than 5 days after the missed dose, do not take the missed dose. Take the next dose at your regular time. Do not take double or extra doses. If you have questions about a missed dose, contact your care team for advice. What may interact with this medication? Other medications for diabetes Many medications may cause changes in blood sugar, these include: Alcohol containing beverages Antiviral medications for HIV or AIDS Aspirin and aspirin-like medications Certain medications for blood pressure, heart disease, irregular heart beat Chromium Diuretics Male hormones, such as estrogens or progestins, birth control pills Fenofibrate Gemfibrozil Isoniazid Lanreotide Male hormones or anabolic steroids MAOIs like Carbex, Eldepryl, Marplan, Nardil, and Parnate Medications for weight loss Medications for allergies, asthma, cold, or cough Medications for depression,   anxiety, or psychotic disturbances Niacin Nicotine NSAIDs, medications for pain and inflammation, like ibuprofen or naproxen Octreotide Pasireotide Pentamidine Phenytoin Probenecid Quinolone antibiotics such as ciprofloxacin, levofloxacin, ofloxacin Some  herbal dietary supplements Steroid medications such as prednisone or cortisone Sulfamethoxazole; trimethoprim Thyroid hormones Some medications can hide the warning symptoms of low blood sugar (hypoglycemia). You may need to monitor your blood sugar more closely if you are taking one of these medications. These include: Beta-blockers, often used for high blood pressure or heart problems (examples include atenolol, metoprolol, propranolol) Clonidine Guanethidine Reserpine This list may not describe all possible interactions. Give your health care provider a list of all the medicines, herbs, non-prescription drugs, or dietary supplements you use. Also tell them if you smoke, drink alcohol, or use illegal drugs. Some items may interact with your medicine. What should I watch for while using this medication? Visit your care team for regular checks on your progress. Drink plenty of fluids while taking this medication. Check with your care team if you get an attack of severe diarrhea, nausea, and vomiting. The loss of too much body fluid can make it dangerous for you to take this medication. A test called the HbA1C (A1C) will be monitored. This is a simple blood test. It measures your blood sugar control over the last 2 to 3 months. You will receive this test every 3 to 6 months. Learn how to check your blood sugar. Learn the symptoms of low and high blood sugar and how to manage them. Always carry a quick-source of sugar with you in case you have symptoms of low blood sugar. Examples include hard sugar candy or glucose tablets. Make sure others know that you can choke if you eat or drink when you develop serious symptoms of low blood sugar, such as seizures or unconsciousness. They must get medical help at once. Tell your care team if you have high blood sugar. You might need to change the dose of your medication. If you are sick or exercising more than usual, you might need to change the dose of your  medication. Do not skip meals. Ask your care team if you should avoid alcohol. Many nonprescription cough and cold products contain sugar or alcohol. These can affect blood sugar. Pens should never be shared. Even if the needle is changed, sharing may result in passing of viruses like hepatitis or HIV. Wear a medical ID bracelet or chain, and carry a card that describes your disease and details of your medication and dosage times. Do not become pregnant while taking this medication. Women should inform their care team if they wish to become pregnant or think they might be pregnant. There is a potential for serious side effects to an unborn child. Talk to your care team for more information. What side effects may I notice from receiving this medication? Side effects that you should report to your care team as soon as possible: Allergic reactions--skin rash, itching, hives, swelling of the face, lips, tongue, or throat Change in vision Dehydration--increased thirst, dry mouth, feeling faint or lightheaded, headache, dark yellow or brown urine Gallbladder problems--severe stomach pain, nausea, vomiting, fever Heart palpitations--rapid, pounding, or irregular heartbeat Kidney injury--decrease in the amount of urine, swelling of the ankles, hands, or feet Pancreatitis--severe stomach pain that spreads to your back or gets worse after eating or when touched, fever, nausea, vomiting Thyroid cancer--new mass or lump in the neck, pain or trouble swallowing, trouble breathing, hoarseness Side effects that usually do not require medical   attention (report to your care team if they continue or are bothersome): Diarrhea Loss of appetite Nausea Stomach pain Vomiting This list may not describe all possible side effects. Call your doctor for medical advice about side effects. You may report side effects to FDA at 1-800-FDA-1088. Where should I keep my medication? Keep out of the reach of children. Store  unopened pens in a refrigerator between 2 and 8 degrees C (36 and 46 degrees F). Do not freeze. Protect from light and heat. After you first use the pen, it can be stored for 56 days at room temperature between 15 and 30 degrees C (59 and 86 degrees F) or in a refrigerator. Throw away your used pen after 56 days or after the expiration date, whichever comes first. Do not store your pen with the needle attached. If the needle is left on, medication may leak from the pen. NOTE: This sheet is a summary. It may not cover all possible information. If you have questions about this medicine, talk to your doctor, pharmacist, or health care provider.  2023 Elsevier/Gold Standard (2020-09-12 00:00:00)  

## 2022-01-02 ENCOUNTER — Other Ambulatory Visit: Payer: Self-pay

## 2022-01-05 ENCOUNTER — Other Ambulatory Visit: Payer: Self-pay

## 2022-01-20 ENCOUNTER — Other Ambulatory Visit: Payer: Self-pay

## 2022-01-20 MED ORDER — APIDRA SOLOSTAR 100 UNIT/ML ~~LOC~~ SOPN
PEN_INJECTOR | SUBCUTANEOUS | 3 refills | Status: DC
  Filled 2022-01-20: qty 75, 83d supply, fill #0

## 2022-01-21 ENCOUNTER — Other Ambulatory Visit: Payer: Self-pay

## 2022-01-22 ENCOUNTER — Other Ambulatory Visit: Payer: Self-pay

## 2022-02-03 ENCOUNTER — Ambulatory Visit: Payer: Self-pay

## 2022-02-10 ENCOUNTER — Ambulatory Visit: Payer: Self-pay

## 2022-02-11 ENCOUNTER — Ambulatory Visit: Payer: Self-pay | Admitting: Gerontology

## 2022-02-17 ENCOUNTER — Other Ambulatory Visit: Payer: Self-pay

## 2022-02-17 ENCOUNTER — Ambulatory Visit: Payer: Self-pay | Admitting: Gerontology

## 2022-02-17 ENCOUNTER — Encounter: Payer: Self-pay | Admitting: Gerontology

## 2022-02-17 VITALS — BP 115/75 | HR 84 | Temp 97.9°F | Resp 16 | Ht 67.0 in | Wt 242.2 lb

## 2022-02-17 DIAGNOSIS — C220 Liver cell carcinoma: Secondary | ICD-10-CM

## 2022-02-17 DIAGNOSIS — E119 Type 2 diabetes mellitus without complications: Secondary | ICD-10-CM

## 2022-02-17 NOTE — Progress Notes (Signed)
Approved note  Established Patient Office Visit  Subjective   Patient ID: Michael Mack, male    DOB: [birth year]  Age: 61 y.o. MRN: [id number]  Chief Complaint  Patient presents with   Follow-up    Patient states he starts radiation 03/03/22 due to his liver cancer returning.    HPI Michael Mack is a 61 year old male with a history of type 2 diabetes, hypertension, hyperlipidemia, and morbid obesity who presents for a routine follow-up for diabetes and lab review. He was started on Ozempic on his last clinic visit on 12/31/21. He has been taking all of his medications as directed and is experiencing no adverse effects. He denies blurred vision, numbness, and tingling. He checks his blood glucose daily and states that they range between 100-160 mg/dl. His blood glucose today was 154 mg/dl. He states his diet has improved and he is drinking plenty of water. He does not exercise recreationally, but he does do active chores such as yard work. He was informed that his labs from July were unremarkable other than his hemoglobin A1c of 7.2%. He is well and has no complaints at this time. He also has been seeing hepatology and will be initiating radiation in September for hepatic carcinoma.  Review of Systems  Constitutional: Negative.   Eyes: Negative.   Respiratory: Negative.    Cardiovascular: Negative.   Gastrointestinal: Negative.   Genitourinary: Negative.   Musculoskeletal:  Positive for joint pain (chronic back and knee pain).  Neurological: Negative.   Psychiatric/Behavioral: Negative.        Objective:     BP 115/75 (BP Location: Right Arm, Patient Position: Sitting, Cuff Size: Large)   Pulse 84   Temp 97.9 F (36.6 C) (Oral)   Resp 16   Ht '5\' 7"'$  (1.702 m)   Wt 242 lb 3.2 oz (109.9 kg)   SpO2 94%   BMI 37.93 kg/m  BP Readings from Last 3 Encounters:  02/17/22 115/75  12/31/21 116/73  12/24/21 111/75   Wt Readings from Last 3 Encounters:  02/17/22 242 lb 3.2 oz (109.9 kg)   12/31/21 244 lb 4.8 oz (110.8 kg)  12/24/21 250 lb 9.6 oz (113.7 kg)      Physical Exam Constitutional:      Appearance: Normal appearance. He is obese.  HENT:     Head: Normocephalic and atraumatic.  Eyes:     Extraocular Movements: Extraocular movements intact.     Conjunctiva/sclera: Conjunctivae normal.     Pupils: Pupils are equal, round, and reactive to light.  Cardiovascular:     Rate and Rhythm: Normal rate and regular rhythm.     Heart sounds: Normal heart sounds.  Pulmonary:     Effort: Pulmonary effort is normal.     Breath sounds: Normal breath sounds.  Musculoskeletal:        General: Normal range of motion.  Neurological:     General: No focal deficit present.     Mental Status: He is alert and oriented to person, place, and time. Mental status is at baseline.  Psychiatric:        Mood and Affect: Mood normal.        Behavior: Behavior normal.        Thought Content: Thought content normal.        Judgment: Judgment normal.      No results found for any visits on 02/17/22.  Last CBC Lab Results  Component Value Date   WBC 6.6 12/24/2021   HGB  15.5 12/24/2021   HCT 45.0 12/24/2021   MCV 91 12/24/2021   MCH 31.4 12/24/2021   RDW 13.3 12/24/2021   PLT 128 (L) 12/24/2021      The ASCVD Risk score (Arnett DK, et al., 2019) failed to calculate for the following reasons:   The patient has a prior MI or stroke diagnosis    Assessment & Plan:  Hepatocellular carcinoma - Discussed his diagnosis of hepatocellular carcinoma and the initiation of radiation in September. Advised the patient to keep the clinic updated with his care and treatments.  Type 2 diabetes without complication, with long-term current use of insulin - He was instructed to continue checking his blood glucose daily. Discussed discontinuing Ozempic for the time-being due to his upcoming radiation treatments, as radiation may further suppress his appetite and stimulate weight loss. He was  advised to continue taking his insulin as directed. He was also educated on a low-carb diet and to exercise as tolerated.   Return in about 3 months (around 05/20/2022), or if symptoms worsen or fail to improve.    Michael Mack

## 2022-02-25 ENCOUNTER — Other Ambulatory Visit: Payer: Self-pay | Admitting: Gerontology

## 2022-02-25 ENCOUNTER — Other Ambulatory Visit: Payer: Self-pay

## 2022-02-25 MED FILL — Lisinopril Tab 40 MG: ORAL | 30 days supply | Qty: 30 | Fill #0 | Status: AC

## 2022-02-25 MED FILL — Hydrochlorothiazide Tab 25 MG: ORAL | 30 days supply | Qty: 30 | Fill #0 | Status: AC

## 2022-02-26 ENCOUNTER — Other Ambulatory Visit: Payer: Self-pay

## 2022-02-27 ENCOUNTER — Other Ambulatory Visit: Payer: Self-pay

## 2022-03-10 ENCOUNTER — Ambulatory Visit: Payer: Self-pay | Admitting: Student

## 2022-03-10 ENCOUNTER — Other Ambulatory Visit: Payer: Self-pay

## 2022-03-10 VITALS — BP 143/83 | HR 93 | Temp 98.5°F | Ht 67.0 in | Wt 239.1 lb

## 2022-03-10 DIAGNOSIS — E119 Type 2 diabetes mellitus without complications: Secondary | ICD-10-CM

## 2022-03-10 NOTE — Patient Instructions (Addendum)
It was so nice to see you today!  Here are the instructions for today: Start Ozempic 0.'25mg'$  on Monday for 4 weeks On the fifth week, increase to 0.'5mg'$  Ozempic. After you increase to 0.'5mg'$  Ozempic, decrease Apidra to 20 units or whatever you feel comfortable with.  Call your primary care physician if you have low blood sugars. We will follow up in November.

## 2022-03-10 NOTE — Progress Notes (Unsigned)
Follow up Diabetes/ Endocrine [facility]     Patient ID: Michael Mack, male   DOB: May 09, 1961, 61 y.o.   MRN: [id number] Assessment:  Michael Mack is a 61 y.o. male who is seen in follow up for No primary diagnosis found. at the request of Tawni Millers, MD.  Encounter Diagnoses No diagnosis found.  Assessment    Plan:         Patient Instructions  It was so nice to see you today!  Here are the instructions for today: Start Ozempic 0.'25mg'$  on Monday for 4 weeks On the fifth week, increase to 0.'5mg'$  Ozempic. After you increase to 0.'5mg'$  Ozempic, decrease Apidra to 20 units or whatever you feel comfortable with.  Call your primary care physician if you have low blood sugars. We will follow up in November.    No orders of the defined types were placed in this encounter.    Subjective:  HPI   Review of Systems  Michael Mack  has a past medical history of Bulging lumbar disc, Chronic pain, Diabetes mellitus without complication ([place]), GERD (gastroesophageal reflux disease), Hepatic encephalopathy ([place]), Hepatitis C, Hepatocellular carcinoma ([place]), Hypertension, Migraines, Morbid obesity ([place]), and Thrombocytopenia ([place]).  Family History, Social History, current Medications and allergies reviewed and updated in Epic.  Objective:    Blood pressure (!) 143/83, pulse 93, temperature 98.5 F (36.9 C), height '5\' 7"'$  (1.702 m), weight 239 lb 1.6 oz (108.5 kg), SpO2 96 %. Physical Exam      Data : I have personally reviewed pertinent labs and imaging studies, if indicated,  with the patient in clinic today.   Lab Orders  No laboratory test(s) ordered today    HC Readings from Last 3 Encounters:  No data found for [facility]    Wt Readings from Last 3 Encounters:  03/10/22 239 lb 1.6 oz (108.5 kg)  02/17/22 242 lb 3.2 oz (109.9 kg)  12/31/21 244 lb 4.8 oz (110.8 kg)

## 2022-03-11 NOTE — Progress Notes (Addendum)
error 

## 2022-03-19 ENCOUNTER — Other Ambulatory Visit: Payer: Self-pay

## 2022-04-01 ENCOUNTER — Other Ambulatory Visit: Payer: Self-pay

## 2022-04-01 MED FILL — Hydrochlorothiazide Tab 25 MG: ORAL | 30 days supply | Qty: 30 | Fill #1 | Status: AC

## 2022-04-01 MED FILL — Lisinopril Tab 40 MG: ORAL | 30 days supply | Qty: 30 | Fill #1 | Status: AC

## 2022-04-02 ENCOUNTER — Other Ambulatory Visit: Payer: Self-pay

## 2022-04-22 ENCOUNTER — Other Ambulatory Visit: Payer: Self-pay

## 2022-04-28 ENCOUNTER — Other Ambulatory Visit: Payer: Self-pay

## 2022-04-28 MED FILL — Hydrochlorothiazide Tab 25 MG: ORAL | 30 days supply | Qty: 30 | Fill #2 | Status: AC

## 2022-04-28 MED FILL — Lisinopril Tab 40 MG: ORAL | 30 days supply | Qty: 30 | Fill #2 | Status: AC

## 2022-04-29 ENCOUNTER — Other Ambulatory Visit: Payer: Self-pay

## 2022-04-30 ENCOUNTER — Other Ambulatory Visit: Payer: Self-pay

## 2022-04-30 ENCOUNTER — Ambulatory Visit: Payer: Self-pay | Admitting: Gerontology

## 2022-04-30 VITALS — BP 130/76 | HR 94 | Temp 98.0°F | Ht 70.0 in | Wt 243.0 lb

## 2022-04-30 DIAGNOSIS — E119 Type 2 diabetes mellitus without complications: Secondary | ICD-10-CM

## 2022-04-30 LAB — GLUCOSE, POCT (MANUAL RESULT ENTRY): POC Glucose: 150 mg/dl — AB (ref 70–99)

## 2022-04-30 LAB — POCT GLYCOSYLATED HEMOGLOBIN (HGB A1C): Hemoglobin A1C: 6.8 % — AB (ref 4.0–5.6)

## 2022-04-30 NOTE — Progress Notes (Signed)
Follow up Diabetes/ Endocrine [facility]    Attestation: I interviewed, examined, and discussed the patient with the student at the time of the visit.  I agree with the findings and plan documented.    [name] reports his fasting BG ranges  from 130 to 140 and denies hypoglycemia episode. Decreased Apidra from 30 units to 25 units TID with meals. Plan decreasing Apidra considering HgbA1c is 6.8% also consider decreasing Lantus if fasting BG is below 130 or having fasting hypoglycemia.      Patient ID: Michael Mack, male   DOB: 1961-02-18, 61 y.o.   MRN: [id number] Assessment:  Michael Mack is a 61 y.o. male who is seen in follow up for No primary diagnosis found. at the request of Michael Millers, MD.  Encounter Diagnoses No diagnosis found.  Assessment    Plan:        There are no Patient Instructions on file for this visit.   No orders of the defined types were placed in this encounter.    Subjective:  HPI   Review of Systems  Michael Mack  has a past medical history of Bulging lumbar disc, Chronic pain, Diabetes mellitus without complication ([place]), GERD (gastroesophageal reflux disease), Hepatic encephalopathy ([place]), Hepatitis C, Hepatocellular carcinoma ([place]), Hypertension, Migraines, Morbid obesity ([place]), and Thrombocytopenia ([place]).  Family History, Social History, current Medications and allergies reviewed and updated in Epic.  Objective:    Blood pressure 130/76, pulse 94, temperature 98 F (36.7 C), temperature source Oral, height '5\' 10"'$  (1.778 m), weight 243 lb (110.2 kg), SpO2 94 %. Physical Exam      Data : I have personally reviewed pertinent labs and imaging studies, if indicated,  with the patient in clinic today.   Lab Orders  No laboratory test(s) ordered today    HC Readings from Last 3 Encounters:  No data found for [facility]    Wt Readings from Last 3 Encounters:  04/30/22 243 lb (110.2 kg)  03/10/22 239 lb 1.6 oz (108.5 kg)  02/17/22 242  lb 3.2 oz (109.9 kg)

## 2022-05-01 ENCOUNTER — Other Ambulatory Visit: Payer: Self-pay

## 2022-05-07 MED ORDER — APIDRA SOLOSTAR 100 UNIT/ML ~~LOC~~ SOPN
25.0000 [IU] | PEN_INJECTOR | Freq: Three times a day (TID) | SUBCUTANEOUS | 3 refills | Status: DC
  Filled 2022-05-07: qty 75, 100d supply, fill #0

## 2022-05-08 ENCOUNTER — Other Ambulatory Visit: Payer: Self-pay

## 2022-05-12 ENCOUNTER — Ambulatory Visit: Payer: Self-pay

## 2022-05-12 ENCOUNTER — Other Ambulatory Visit: Payer: Self-pay

## 2022-05-13 ENCOUNTER — Telehealth: Payer: Self-pay | Admitting: Internal Medicine

## 2022-05-13 ENCOUNTER — Other Ambulatory Visit: Payer: Self-pay | Admitting: Gerontology

## 2022-05-13 ENCOUNTER — Other Ambulatory Visit: Payer: Self-pay

## 2022-05-13 MED ORDER — COMFORT EZ PEN NEEDLES 32G X 4 MM MISC
11 refills | Status: AC
  Filled 2022-05-13: qty 100, 25d supply, fill #0
  Filled 2023-02-09: qty 100, 25d supply, fill #1

## 2022-05-13 NOTE — Telephone Encounter (Signed)
Called pt to make 3 mo f/u appt for endo clinic. No answer. Left msg.

## 2022-05-15 ENCOUNTER — Other Ambulatory Visit: Payer: Self-pay

## 2022-05-15 MED ORDER — INSULIN GLARGINE 100 UNIT/ML SOLOSTAR PEN: 50.0000 [IU] | PEN_INJECTOR | Freq: Every day | SUBCUTANEOUS | 3 refills | Status: DC

## 2022-05-20 ENCOUNTER — Ambulatory Visit: Payer: Self-pay | Admitting: Gerontology

## 2022-05-20 ENCOUNTER — Other Ambulatory Visit: Payer: Self-pay

## 2022-05-20 ENCOUNTER — Encounter: Payer: Self-pay | Admitting: Gerontology

## 2022-05-20 VITALS — BP 112/71 | HR 79 | Temp 97.8°F | Resp 17 | Ht 66.0 in | Wt 238.6 lb

## 2022-05-20 DIAGNOSIS — E119 Type 2 diabetes mellitus without complications: Secondary | ICD-10-CM

## 2022-05-20 DIAGNOSIS — E1159 Type 2 diabetes mellitus with other circulatory complications: Secondary | ICD-10-CM

## 2022-05-20 MED ORDER — LISINOPRIL 40 MG PO TABS
ORAL_TABLET | Freq: Every day | ORAL | 1 refills | Status: DC
  Filled 2022-05-20: qty 90, fill #0
  Filled 2022-06-01: qty 90, 90d supply, fill #0
  Filled 2022-08-31: qty 90, 90d supply, fill #1

## 2022-05-20 MED ORDER — HYDROCHLOROTHIAZIDE 25 MG PO TABS
ORAL_TABLET | Freq: Every day | ORAL | 1 refills | Status: DC
  Filled 2022-05-20: qty 90, fill #0
  Filled 2022-06-01: qty 90, 90d supply, fill #0
  Filled 2022-08-31: qty 90, 90d supply, fill #1

## 2022-05-20 NOTE — Patient Instructions (Signed)
DASH Eating Plan DASH stands for Dietary Approaches to Stop Hypertension. The DASH eating plan is a healthy eating plan that has been shown to: Reduce high blood pressure (hypertension). Reduce your risk for type 2 diabetes, heart disease, and stroke. Help with weight loss. What are tips for following this plan? Reading food labels Check food labels for the amount of salt (sodium) per serving. Choose foods with less than 5 percent of the Daily Value of sodium. Generally, foods with less than 300 milligrams (mg) of sodium per serving fit into this eating plan. To find whole grains, look for the word "whole" as the first word in the ingredient list. Shopping Buy products labeled as "low-sodium" or "no salt added." Buy fresh foods. Avoid canned foods and pre-made or frozen meals. Cooking Avoid adding salt when cooking. Use salt-free seasonings or herbs instead of table salt or sea salt. Check with your health care provider or pharmacist before using salt substitutes. Do not fry foods. Cook foods using healthy methods such as baking, boiling, grilling, roasting, and broiling instead. Cook with heart-healthy oils, such as olive, canola, avocado, soybean, or sunflower oil. Meal planning  Eat a balanced diet that includes: 4 or more servings of fruits and 4 or more servings of vegetables each day. Try to fill one-half of your plate with fruits and vegetables. 6-8 servings of whole grains each day. Less than 6 oz (170 g) of lean meat, poultry, or fish each day. A 3-oz (85-g) serving of meat is about the same size as a deck of cards. One egg equals 1 oz (28 g). 2-3 servings of low-fat dairy each day. One serving is 1 cup (237 mL). 1 serving of nuts, seeds, or beans 5 times each week. 2-3 servings of heart-healthy fats. Healthy fats called omega-3 fatty acids are found in foods such as walnuts, flaxseeds, fortified milks, and eggs. These fats are also found in cold-water fish, such as sardines, salmon,  and mackerel. Limit how much you eat of: Canned or prepackaged foods. Food that is high in trans fat, such as some fried foods. Food that is high in saturated fat, such as fatty meat. Desserts and other sweets, sugary drinks, and other foods with added sugar. Full-fat dairy products. Do not salt foods before eating. Do not eat more than 4 egg yolks a week. Try to eat at least 2 vegetarian meals a week. Eat more home-cooked food and less restaurant, buffet, and fast food. Lifestyle When eating at a restaurant, ask that your food be prepared with less salt or no salt, if possible. If you drink alcohol: Limit how much you use to: 0-1 drink a day for women who are not pregnant. 0-2 drinks a day for men. Be aware of how much alcohol is in your drink. In the [place] one drink equals one 12 oz bottle of beer (355 mL), one 5 oz glass of wine (148 mL), or one 1 oz glass of hard liquor (44 mL). General information Avoid eating more than 2,300 mg of salt a day. If you have hypertension, you may need to reduce your sodium intake to 1,500 mg a day. Work with your health care provider to maintain a healthy body weight or to lose weight. Ask what an ideal weight is for you. Get at least 30 minutes of exercise that causes your heart to beat faster (aerobic exercise) most days of the week. Activities may include walking, swimming, or biking. Work with your health care provider or dietitian to   adjust your eating plan to your individual calorie needs. What foods should I eat? Fruits All fresh, dried, or frozen fruit. Canned fruit in natural juice (without added sugar). Vegetables Fresh or frozen vegetables (raw, steamed, roasted, or grilled). Low-sodium or reduced-sodium tomato and vegetable juice. Low-sodium or reduced-sodium tomato sauce and tomato paste. Low-sodium or reduced-sodium canned vegetables. Grains Whole-grain or whole-wheat bread. Whole-grain or whole-wheat pasta. Brown rice. Oatmeal. Quinoa.  Bulgur. Whole-grain and low-sodium cereals. Pita bread. Low-fat, low-sodium crackers. Whole-wheat flour tortillas. Meats and other proteins Skinless chicken or turkey. Ground chicken or turkey. Pork with fat trimmed off. Fish and seafood. Egg whites. Dried beans, peas, or lentils. Unsalted nuts, nut butters, and seeds. Unsalted canned beans. Lean cuts of beef with fat trimmed off. Low-sodium, lean precooked or cured meat, such as sausages or meat loaves. Dairy Low-fat (1%) or fat-free (skim) milk. Reduced-fat, low-fat, or fat-free cheeses. Nonfat, low-sodium ricotta or cottage cheese. Low-fat or nonfat yogurt. Low-fat, low-sodium cheese. Fats and oils Soft margarine without trans fats. Vegetable oil. Reduced-fat, low-fat, or light mayonnaise and salad dressings (reduced-sodium). Canola, safflower, olive, avocado, soybean, and sunflower oils. Avocado. Seasonings and condiments Herbs. Spices. Seasoning mixes without salt. Other foods Unsalted popcorn and pretzels. Fat-free sweets. The items listed above may not be a complete list of foods and beverages you can eat. Contact a dietitian for more information. What foods should I avoid? Fruits Canned fruit in a light or heavy syrup. Fried fruit. Fruit in cream or butter sauce. Vegetables Creamed or fried vegetables. Vegetables in a cheese sauce. Regular canned vegetables (not low-sodium or reduced-sodium). Regular canned tomato sauce and paste (not low-sodium or reduced-sodium). Regular tomato and vegetable juice (not low-sodium or reduced-sodium). Pickles. Olives. Grains Baked goods made with fat, such as croissants, muffins, or some breads. Dry pasta or rice meal packs. Meats and other proteins Fatty cuts of meat. Ribs. Fried meat. Bacon. Bologna, salami, and other precooked or cured meats, such as sausages or meat loaves. Fat from the back of a pig (fatback). Bratwurst. Salted nuts and seeds. Canned beans with added salt. Canned or smoked fish.  Whole eggs or egg yolks. Chicken or turkey with skin. Dairy Whole or 2% milk, cream, and half-and-half. Whole or full-fat cream cheese. Whole-fat or sweetened yogurt. Full-fat cheese. Nondairy creamers. Whipped toppings. Processed cheese and cheese spreads. Fats and oils Butter. Stick margarine. Lard. Shortening. Ghee. Bacon fat. Tropical oils, such as coconut, palm kernel, or palm oil. Seasonings and condiments Onion salt, garlic salt, seasoned salt, table salt, and sea salt. Worcestershire sauce. Tartar sauce. Barbecue sauce. Teriyaki sauce. Soy sauce, including reduced-sodium. Steak sauce. Canned and packaged gravies. Fish sauce. Oyster sauce. Cocktail sauce. Store-bought horseradish. Ketchup. Mustard. Meat flavorings and tenderizers. Bouillon cubes. Hot sauces. Pre-made or packaged marinades. Pre-made or packaged taco seasonings. Relishes. Regular salad dressings. Other foods Salted popcorn and pretzels. The items listed above may not be a complete list of foods and beverages you should avoid. Contact a dietitian for more information. Where to find more information National Heart, Lung, and Blood Institute: www.nhlbi.nih.gov American Heart Association: www.heart.org Academy of Nutrition and Dietetics: www.eatright.org National Kidney Foundation: www.kidney.org Summary The DASH eating plan is a healthy eating plan that has been shown to reduce high blood pressure (hypertension). It may also reduce your risk for type 2 diabetes, heart disease, and stroke. When on the DASH eating plan, aim to eat more fresh fruits and vegetables, whole grains, lean proteins, low-fat dairy, and heart-healthy fats. With the DASH   eating plan, you should limit salt (sodium) intake to 2,300 mg a day. If you have hypertension, you may need to reduce your sodium intake to 1,500 mg a day. Work with your health care provider or dietitian to adjust your eating plan to your individual calorie needs. This information is not  intended to replace advice given to you by your health care provider. Make sure you discuss any questions you have with your health care provider. Document Revised: 05/12/2019 Document Reviewed: 05/12/2019 Elsevier Patient Education  2023 Elsevier Inc. Carbohydrate Counting for Diabetes Mellitus, Adult Carbohydrate counting is a method of keeping track of how many carbohydrates you eat. Eating carbohydrates increases the amount of sugar (glucose) in the blood. Counting how many carbohydrates you eat improves how well you manage your blood glucose. This, in turn, helps you manage your diabetes. Carbohydrates are measured in grams (g) per serving. It is important to know how many carbohydrates (in grams or by serving size) you can have in each meal. This is different for every person. A dietitian can help you make a meal plan and calculate how many carbohydrates you should have at each meal and snack. What foods contain carbohydrates? Carbohydrates are found in the following foods: Grains, such as breads and cereals. Dried beans and soy products. Starchy vegetables, such as potatoes, peas, and corn. Fruit and fruit juices. Milk and yogurt. Sweets and snack foods, such as cake, cookies, candy, chips, and soft drinks. How do I count carbohydrates in foods? There are two ways to count carbohydrates in food. You can read food labels or learn standard serving sizes of foods. You can use either of these methods or a combination of both. Using the Nutrition Facts label The Nutrition Facts list is included on the labels of almost all packaged foods and beverages in the [place]. It includes: The serving size. Information about nutrients in each serving, including the grams of carbohydrate per serving. To use the Nutrition Facts, decide how many servings you will have. Then, multiply the number of servings by the number of carbohydrates per serving. The resulting number is the total grams of  carbohydrates that you will be having. Learning the standard serving sizes of foods When you eat carbohydrate foods that are not packaged or do not include Nutrition Facts on the label, you need to measure the servings in order to count the grams of carbohydrates. Measure the foods that you will eat with a food scale or measuring cup, if needed. Decide how many standard-size servings you will eat. Multiply the number of servings by 15. For foods that contain carbohydrates, one serving equals 15 g of carbohydrates. For example, if you eat 2 cups or 10 oz (300 g) of strawberries, you will have eaten 2 servings and 30 g of carbohydrates (2 servings x 15 g = 30 g). For foods that have more than one food mixed, such as soups and casseroles, you must count the carbohydrates in each food that is included. The following list contains standard serving sizes of common carbohydrate-rich foods. Each of these servings has about 15 g of carbohydrates: 1 slice of bread. 1 six-inch (15 cm) tortilla. ? cup or 2 oz (53 g) cooked rice or pasta.  cup or 3 oz (85 g) cooked or canned, drained and rinsed beans or lentils.  cup or 3 oz (85 g) starchy vegetable, such as peas, corn, or squash.  cup or 4 oz (120 g) hot cereal.  cup or 3 oz (85   g) boiled or mashed potatoes, or  or 3 oz (85 g) of a large baked potato.  cup or 4 fl oz (118 mL) fruit juice. 1 cup or 8 fl oz (237 mL) milk. 1 small or 4 oz (106 g) apple.  or 2 oz (63 g) of a medium banana. 1 cup or 5 oz (150 g) strawberries. 3 cups or 1 oz (28.3 g) popped popcorn. What is an example of carbohydrate counting? To calculate the grams of carbohydrates in this sample meal, follow the steps shown below. Sample meal 3 oz (85 g) chicken breast. ? cup or 4 oz (106 g) brown rice.  cup or 3 oz (85 g) corn. 1 cup or 8 fl oz (237 mL) milk. 1 cup or 5 oz (150 g) strawberries with sugar-free whipped topping. Carbohydrate calculation Identify the foods that  contain carbohydrates: Rice. Corn. Milk. Strawberries. Calculate how many servings you have of each food: 2 servings rice. 1 serving corn. 1 serving milk. 1 serving strawberries. Multiply each number of servings by 15 g: 2 servings rice x 15 g = 30 g. 1 serving corn x 15 g = 15 g. 1 serving milk x 15 g = 15 g. 1 serving strawberries x 15 g = 15 g. Add together all of the amounts to find the total grams of carbohydrates eaten: 30 g + 15 g + 15 g + 15 g = 75 g of carbohydrates total. What are tips for following this plan? Shopping Develop a meal plan and then make a shopping list. Buy fresh and frozen vegetables, fresh and frozen fruit, dairy, eggs, beans, lentils, and whole grains. Look at food labels. Choose foods that have more fiber and less sugar. Avoid processed foods and foods with added sugars. Meal planning Aim to have the same number of grams of carbohydrates at each meal and for each snack time. Plan to have regular, balanced meals and snacks. Where to find more information American Diabetes Association: diabetes.org Centers for Disease Control and Prevention: cdc.gov Academy of Nutrition and Dietetics: eatright.org Association of Diabetes Care & Education Specialists: diabeteseducator.org Summary Carbohydrate counting is a method of keeping track of how many carbohydrates you eat. Eating carbohydrates increases the amount of sugar (glucose) in your blood. Counting how many carbohydrates you eat improves how well you manage your blood glucose. This helps you manage your diabetes. A dietitian can help you make a meal plan and calculate how many carbohydrates you should have at each meal and snack. This information is not intended to replace advice given to you by your health care provider. Make sure you discuss any questions you have with your health care provider. Document Revised: 01/10/2020 Document Reviewed: 01/10/2020 Elsevier Patient Education  2023 Elsevier  Inc.  

## 2022-05-20 NOTE — Progress Notes (Unsigned)
Established Patient Office Visit  Subjective   Patient ID: Michael Mack, male    DOB: 08-Jun-1961  Age: 60 y.o. MRN: [id number]  Chief Complaint  Patient presents with   Follow-up   Diabetes    HPI  Michael Mack is a 61 year old male with a history of type 2 diabetes, hypertension, hyperlipidemia, and morbid obesity who presents for a routine follow-up. His HgbA1c was 6.8%, He checks his blood glucose tid, fasting is usually in the 130's., he denies hypo/hyperglycemic symptoms. He states that he is compliant with his medications, takes 25 units of Apidra  Review of Systems  Constitutional: Negative.   Eyes: Negative.   Cardiovascular: Negative.   Skin: Negative.   Neurological: Negative.   Endo/Heme/Allergies: Negative.   Psychiatric/Behavioral: Negative.        Objective:     BP 112/71 (BP Location: Left Arm, Patient Position: Sitting, Cuff Size: Large)   Pulse 79   Temp 97.8 F (36.6 C) (Oral)   Resp 17   Ht _0  (1.676 m)   Wt 238 lb 9.6 oz (108.2 kg)   SpO2 94%   BMI 38.51 kg/m  BP Readings from Last 3 Encounters:  05/20/22 112/71  04/30/22 130/76  03/10/22 (!) 143/83   Wt Readings from Last 3 Encounters:  05/20/22 238 lb 9.6 oz (108.2 kg)  04/30/22 243 lb (110.2 kg)  03/10/22 239 lb 1.6 oz (108.5 kg)      Physical Exam   No results found for any visits on 05/20/22.  Last CBC Lab Results  Component Value Date   WBC 6.6 12/24/2021   HGB 15.5 12/24/2021   HCT 45.0 12/24/2021   MCV 91 12/24/2021   MCH 31.4 12/24/2021   RDW 13.3 12/24/2021   PLT 128 (L) 32/76/1470   Last metabolic panel Lab Results  Component Value Date   GLUCOSE 166 (H) 12/24/2021   NA 141 12/24/2021   K 3.8 12/24/2021   CL 100 12/24/2021   CO2 23 12/24/2021   BUN 21 12/24/2021   CREATININE 0.87 12/24/2021   EGFR 99 12/24/2021   CALCIUM 9.9 12/24/2021   PHOS 4.0 07/25/2021   PROT 7.2 12/24/2021   ALBUMIN 4.2 12/24/2021   LABGLOB 3.0 12/24/2021   AGRATIO 1.4  12/24/2021   BILITOT 0.6 12/24/2021   ALKPHOS 74 12/24/2021   AST 20 12/24/2021   ALT 13 12/24/2021   ANIONGAP 3 (L) 07/25/2021   Last lipids Lab Results  Component Value Date   CHOL 181 12/24/2021   HDL 50 12/24/2021   LDLCALC 112 (H) 12/24/2021   TRIG 104 12/24/2021   CHOLHDL 3.6 12/24/2021   Last hemoglobin A1c Lab Results  Component Value Date   HGBA1C 6.8 (A) 04/30/2022   Last thyroid functions Lab Results  Component Value Date   TSH 2.620 12/24/2021      The ASCVD Risk score (Arnett DK, et al., 2019) failed to calculate for the following reasons:   The patient has a prior MI or stroke diagnosis    Assessment & Plan:   Problem List Items Addressed This Visit       Endocrine   Diabetes ([place]) - Primary   Relevant Medications   lisinopril (ZESTRIL) 40 MG tablet   Other Visit Diagnoses     Hypertension associated with diabetes ([place])       Relevant Medications   hydrochlorothiazide (HYDRODIURIL) 25 MG tablet   lisinopril (ZESTRIL) 40 MG tablet       Return in about  2 months (around 07/30/2022), or if symptoms worsen or fail to improve.    Lasean Gorniak Jerold Coombe, NP

## 2022-06-01 ENCOUNTER — Other Ambulatory Visit: Payer: Self-pay

## 2022-06-02 ENCOUNTER — Other Ambulatory Visit: Payer: Self-pay

## 2022-06-05 ENCOUNTER — Other Ambulatory Visit: Payer: Self-pay

## 2022-07-07 ENCOUNTER — Other Ambulatory Visit: Payer: Self-pay

## 2022-07-07 ENCOUNTER — Other Ambulatory Visit: Payer: Self-pay | Admitting: Gerontology

## 2022-07-07 DIAGNOSIS — K7460 Unspecified cirrhosis of liver: Secondary | ICD-10-CM

## 2022-07-08 ENCOUNTER — Other Ambulatory Visit (HOSPITAL_COMMUNITY): Payer: Self-pay

## 2022-07-08 ENCOUNTER — Other Ambulatory Visit: Payer: Self-pay

## 2022-07-08 MED ORDER — LACTULOSE 10 GM/15ML PO SOLN
ORAL | 11 refills | Status: AC
  Filled 2022-07-08: qty 2365, 26d supply, fill #0
  Filled 2022-08-04: qty 2365, 26d supply, fill #1
  Filled 2022-08-31: qty 2365, 26d supply, fill #2
  Filled 2022-10-12: qty 2365, 26d supply, fill #3
  Filled 2022-11-26: qty 2365, 26d supply, fill #4
  Filled 2022-12-29: qty 2365, 26d supply, fill #5
  Filled 2023-02-08: qty 2365, 26d supply, fill #6

## 2022-07-09 ENCOUNTER — Other Ambulatory Visit: Payer: Self-pay

## 2022-07-23 ENCOUNTER — Other Ambulatory Visit: Payer: Self-pay

## 2022-07-23 DIAGNOSIS — E119 Type 2 diabetes mellitus without complications: Secondary | ICD-10-CM

## 2022-07-24 LAB — CBC WITH DIFFERENTIAL/PLATELET

## 2022-07-25 LAB — HEMOGLOBIN A1C
Est. average glucose Bld gHb Est-mCnc: 209 mg/dL
Hgb A1c MFr Bld: 8.9 % — ABNORMAL HIGH (ref 4.8–5.6)

## 2022-07-25 LAB — CBC WITH DIFFERENTIAL/PLATELET
Basophils Absolute: 0 x10E3/uL (ref 0.0–0.2)
Basos: 0 %
EOS (ABSOLUTE): 0.1 x10E3/uL (ref 0.0–0.4)
Eos: 4 %
Hematocrit: 36.8 % — ABNORMAL LOW (ref 37.5–51.0)
Hemoglobin: 12.5 g/dL — ABNORMAL LOW (ref 13.0–17.7)
Immature Grans (Abs): 0 x10E3/uL (ref 0.0–0.1)
Immature Granulocytes: 0 %
Lymphocytes Absolute: 0.6 x10E3/uL — ABNORMAL LOW (ref 0.7–3.1)
Lymphs: 18 %
MCH: 31.2 pg (ref 26.6–33.0)
MCHC: 34 g/dL (ref 31.5–35.7)
MCV: 92 fL (ref 79–97)
Monocytes Absolute: 0.3 x10E3/uL (ref 0.1–0.9)
Monocytes: 9 %
Neutrophils Absolute: 2.3 x10E3/uL (ref 1.4–7.0)
Neutrophils: 69 %
Platelets: 68 x10E3/uL — CL (ref 150–450)
RBC: 4.01 x10E6/uL — ABNORMAL LOW (ref 4.14–5.80)
RDW: 13.5 % (ref 11.6–15.4)
WBC: 3.4 x10E3/uL (ref 3.4–10.8)

## 2022-07-30 ENCOUNTER — Other Ambulatory Visit: Payer: Self-pay

## 2022-07-30 ENCOUNTER — Ambulatory Visit: Payer: Self-pay | Admitting: Gerontology

## 2022-07-30 VITALS — BP 136/78 | HR 83 | Temp 98.2°F | Resp 20 | Ht 67.0 in | Wt 249.2 lb

## 2022-07-30 DIAGNOSIS — E119 Type 2 diabetes mellitus without complications: Secondary | ICD-10-CM

## 2022-07-30 MED ORDER — SEMAGLUTIDE(0.25 OR 0.5MG/DOS) 2 MG/3ML ~~LOC~~ SOPN
0.5000 mg | PEN_INJECTOR | SUBCUTANEOUS | 2 refills | Status: DC
  Filled 2022-07-30: qty 3, fill #0

## 2022-07-30 MED ORDER — SEMAGLUTIDE(0.25 OR 0.5MG/DOS) 2 MG/3ML ~~LOC~~ SOPN
0.2500 mg | PEN_INJECTOR | SUBCUTANEOUS | 0 refills | Status: DC
  Filled 2022-07-30: qty 3, 28d supply, fill #0

## 2022-07-30 MED ORDER — INSULIN GLARGINE 100 UNIT/ML SOLOSTAR PEN
42.0000 [IU] | PEN_INJECTOR | Freq: Every day | SUBCUTANEOUS | 3 refills | Status: AC
  Filled 2022-07-30 – 2022-08-31 (×2): qty 45, 107d supply, fill #0
  Filled 2022-12-29: qty 45, 107d supply, fill #1
  Filled 2023-04-22: qty 45, 107d supply, fill #2

## 2022-07-30 MED ORDER — APIDRA SOLOSTAR 100 UNIT/ML ~~LOC~~ SOPN
20.0000 [IU] | PEN_INJECTOR | Freq: Three times a day (TID) | SUBCUTANEOUS | 3 refills | Status: DC
  Filled 2022-07-30: qty 75, 125d supply, fill #0
  Filled 2022-08-31 – 2022-09-02 (×2): qty 90, 150d supply, fill #0
  Filled 2022-12-29: qty 90, 150d supply, fill #1

## 2022-07-30 NOTE — Progress Notes (Incomplete)
Follow up Diabetes/ Endocrine [facility]     Patient ID: Michael Mack, male   DOB: Oct 14, 1960, 62 y.o.   MRN: [id number] Assessment/Plain:  Michael Mack is a 62 y.o. male who is seen in follow up for Type 2 diabetes mellitus without complication, with long-term current use of insulin ([place]) [E11.9, Z79.4] at the request of Tawni Millers, MD.   Ozempic: 0.'25mg'$  for 4 weeks Lantus: decrease to 42 units, making sure to check fasting blood sugars daily Apidra: 20 units  Encounter Diagnoses 1. Type 2 diabetes mellitus without complication, with long-term current use of insulin [facility])     Assessment      Patient Instructions  It was so nice to see you today!  Here is the plan for today: Start Ozempic 0.'25mg'$  on Monday for 4 weeks. On the fifth week, increase to 0.'5mg'$  Ozempic. Decrease Lantus to 42 units Please monitor your blood sugar in the mornings and before meals to check for low blood sugars. Call your primary care physician if you have low blood sugars. Decrease Apidra to 20 units before meals. We saw that your platelets were low. Monitor for excessive bruising and be careful when operating heavy machinery. Please wear masks in public. Please follow up with your oncology doctors about this. We will follow up in 3 months.   No orders of the defined types were placed in this encounter.    Subjective:  [name] is a 62 yo male, with a PMH of HCC, HTN, hyperlipidemia, obesity, with a recent A1c of 8.9% on 07/23/22. He has been checking his blood glucose three times a day (fasting, before lunch and before dinner). The blood sugars have been ranging from 130-160, with the highest being 220 mg/dl.He denies hyperglycemia symptoms (no polydipsia, no polyuria, no weight loss, no numbness or tingling and no vision changes).    Glucose: Fasting:110-140 (180), after lunch, after dinner 130-160, highest: 220 (after lunch)  Hyperglycemia: no polydipsia, polyuria, no weight loss, no  numbness or tingling, vision changes no  Hypoglycemia: shakiness, sweating (happened a month ago, usually eat a what is in the fridge, when: ) In the afternoon, around 3pm, took insulin, did not eat much after taking insulin  Radiation: course is finished, follow up appointment on 3/9  Eyes checked: stated Dec 29th  Apidra: closer to 30 units, TID Glargine: 50 units Lisinopril  ACR: need an updated one  Diet: eggs, biscuit, supper: spaghetti Treadmill, tractor  Alcohol: no Smoking: no Other drugs: no  No chest pain, no SOB  CV: nl Pulm: nl Foot exam:       Review of Systems  Respiratory:  Negative for shortness of breath.   Cardiovascular:  Negative for chest pain.    Michael Mack  has a past medical history of Bulging lumbar disc, Chronic pain, Diabetes mellitus without complication ([place]), GERD (gastroesophageal reflux disease), Hepatic encephalopathy ([place]), Hepatitis C, Hepatocellular carcinoma ([place]), Hypertension, Migraines, Morbid obesity ([place]), and Thrombocytopenia ([place]).  Family History, Social History, current Medications and allergies reviewed and updated in Epic.  Objective:    There were no vitals taken for this visit. Physical Exam Cardiovascular:     Rate and Rhythm: Normal rate and regular rhythm.     Heart sounds: Normal heart sounds.  Pulmonary:     Effort: Pulmonary effort is normal.     Breath sounds: Normal breath sounds.  Feet:     Comments: Intact sensation to a 10 gauge monofilament needle  Data : I have personally reviewed pertinent labs and imaging studies, if indicated,  with the patient in clinic today.   Lab Orders  No laboratory test(s) ordered today    HC Readings from Last 3 Encounters:  No data found for [facility]    Wt Readings from Last 3 Encounters:  07/30/22 249 lb 3.2 oz (113 kg)  05/20/22 238 lb 9.6 oz (108.2 kg)  04/30/22 243 lb (110.2 kg)

## 2022-07-30 NOTE — Progress Notes (Signed)
Follow up Diabetes/ End[facility]     Patient ID: Michael Mack, male   DOB: [birth year], 62 y.o.   MRN: [id number] Assessment/Plain:  Michael Mack is a 62 y.o. male who is seen in follow up for Type 2 diabetes mellitus without complication, with long-term current use of insulin (Obion) [E11.9, Z79.4] at the request of Tawni Millers, MD. Michael Mack HbA1c has increased from 6.8% on 04/30/22 to 8.9% on 07/23/22. With his uptrending HbA1c and completion of radiation therapy, we decided to resume Ozempic at 0.21m for 4 weeks and titrate up his dose up to 0.566mat the fifth week. To prevent episodes of low blood sugar, Lantus was decreased by 15% to 42 units and Apidra was decreased to 20 units before meals, to prevent low blood sugars. He was counseled to routinely measure his blood sugars, especially his fasting blood sugars to monitor for low blood sugars. 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     Encounter Diagnoses 1. Type 2 diabetes mellitus without complication, with [facility]      Patient Instructions  It was so nice to see you today!  Here is the plan for today: Start Ozempic 0.2562mn Monday for 4 weeks. On the fifth week, increase to 0.5mg39mempic. Decrease Lantus to 42 units Please monitor your blood sugar in the mornings and before meals to check for low blood sugars. Call your primary care physician if you have low blood sugars. Decrease Apidra to 20 units before meals. We saw that your platelets were low. Monitor for excessive bruising and be careful when operating heavy machinery. Please wear masks in public. Please follow up with your oncology doctors about this. We will follow up in 3 months.   No orders of the defined types were placed in this encounter.     Subjective:  Michael Mack 61 y51male, with a PMH of HCC, HTN, hyperlipidemia, obesity, with a recent A1c of 8.9% on 07/23/22. He has been checking his blood glucose three times a day (fasting, before lunch and before dinner). The blood sugars have been ranging from 130-160, with the highest being 220 mg/dl. He denies hyperglycemia symptoms (no polydipsia, no polyuria, no weight loss, no numbness or tingling and no vision changes). He had one recent episode a month ago of hypoglycemia symptoms, where he felt shaky and sweaty. This occurred around 3pm and it was when he took his mealtime insulin while eating a small meal. He usually takes something to eat from the fridge when that occurs to stop his symptoms. For diabetes medications, he takes Apidra (20-30 units, closer to 30 units, TID) and 50 units Glargine. He injects his insulin into his arms or thigh. He has not felt any tenderness or pain in those areas and he has been rotating his injection sites.  His radiation course was finished for his HCC [facility] he has a follow up appointment in the beginning of March. He stated that he got his "eyes checked" on Dec 29th. His diet usually consists of eggs or biscuits for breakfast and for supper, a meal like spaghetti. For exercise, he walks on the treadmill and maintains his his property, mainly working with a tractor. He does not drink alcohol, does not smoke and no other illicit  drug use.  Review of Systems  Respiratory:  Negative for shortness of breath.   Cardiovascular:  Negative for chest pain.   Michael Mack  has a past medical history of Bulging lumbar disc, Chronic pain, Diabetes mellitus without complication ([place]), GERD (gastroesophageal reflux disease), Hepatic encephalopathy ([place]), Hepatitis C, Hepatocellular carcinoma ([place]), Hypertension, Migraines, Morbid obesity ([place]), and Thrombocytopenia ([place]).  Family History, Social History, current Medications and allergies reviewed and updated in Epic.   Objective:   Vitals:   07/30/22 1808  BP: 136/78  Pulse: 83  Resp: 20  Temp: 98.2 F (36.8 C)  SpO2: 90%  ACR (in 2019): 282.5  Physical Exam Cardiovascular:     Rate and Rhythm: Normal rate and regular rhythm.     Heart sounds: Normal heart sounds.  Pulmonary:     Effort: Pulmonary effort is normal.     Breath sounds: Normal breath sounds.  Feet:     Comments: Intact sensation to a 10 gauge monofilament needle Data : I have personally reviewed pertinent labs and imaging studies, if indicated,  with the patient in clinic today.     Lab Orders  No laboratory test(s) ordered today    HC Readings from Last 3 Encounters:  No data found for [facility]    Wt Readings from Last 3 Encounters:  07/30/22 249 lb 3.2 oz (113 kg)  05/20/22 238 lb 9.6 oz (108.2 kg)  04/30/22 243 lb (110.2 kg)

## 2022-07-30 NOTE — Patient Instructions (Addendum)
It was so nice to see you today!  Here is the plan for today: Start Ozempic 0.'25mg'$  on Monday for 4 weeks. On the fifth week, increase to 0.'5mg'$  Ozempic. Decrease Lantus to 42 units Please monitor your blood sugar in the mornings and before meals to check for low blood sugars. Call your primary care physician if you have low blood sugars. Decrease Apidra to 20 units before meals. We saw that your platelets were low. Monitor for excessive bruising and be careful when operating heavy machinery. Please wear masks in public. Please follow up with your oncology doctors about this. We will follow up in 3 months.

## 2022-07-31 ENCOUNTER — Other Ambulatory Visit: Payer: Self-pay

## 2022-08-04 ENCOUNTER — Other Ambulatory Visit: Payer: Self-pay

## 2022-08-05 ENCOUNTER — Other Ambulatory Visit: Payer: Self-pay

## 2022-08-11 ENCOUNTER — Other Ambulatory Visit: Payer: Self-pay

## 2022-08-19 ENCOUNTER — Other Ambulatory Visit: Payer: Self-pay

## 2022-08-20 ENCOUNTER — Other Ambulatory Visit: Payer: Self-pay

## 2022-08-31 ENCOUNTER — Other Ambulatory Visit: Payer: Self-pay

## 2022-09-01 ENCOUNTER — Other Ambulatory Visit: Payer: Self-pay

## 2022-09-02 ENCOUNTER — Other Ambulatory Visit: Payer: Self-pay

## 2022-09-09 ENCOUNTER — Other Ambulatory Visit: Payer: Self-pay

## 2022-10-12 ENCOUNTER — Other Ambulatory Visit: Payer: Self-pay

## 2022-10-29 ENCOUNTER — Ambulatory Visit: Payer: Self-pay

## 2022-10-31 ENCOUNTER — Emergency Department: Payer: Medicaid Other

## 2022-10-31 ENCOUNTER — Encounter: Payer: Self-pay | Admitting: Emergency Medicine

## 2022-10-31 ENCOUNTER — Other Ambulatory Visit: Payer: Self-pay

## 2022-10-31 ENCOUNTER — Inpatient Hospital Stay
Admission: EM | Admit: 2022-10-31 | Discharge: 2022-11-02 | DRG: 871 | Disposition: A | Discharge status: Home / Self Care | Payer: Medicaid Other | Attending: Hospitalist | Admitting: Hospitalist

## 2022-10-31 DIAGNOSIS — D649 Anemia, unspecified: Secondary | ICD-10-CM | POA: Diagnosis present

## 2022-10-31 DIAGNOSIS — Z6841 Body Mass Index (BMI) 40.0 and over, adult: Secondary | ICD-10-CM | POA: Diagnosis not present

## 2022-10-31 DIAGNOSIS — E119 Type 2 diabetes mellitus without complications: Secondary | ICD-10-CM

## 2022-10-31 DIAGNOSIS — I10 Essential (primary) hypertension: Secondary | ICD-10-CM | POA: Diagnosis present

## 2022-10-31 DIAGNOSIS — Z8505 Personal history of malignant neoplasm of liver: Secondary | ICD-10-CM

## 2022-10-31 DIAGNOSIS — Z794 Long term (current) use of insulin: Secondary | ICD-10-CM

## 2022-10-31 DIAGNOSIS — G8929 Other chronic pain: Secondary | ICD-10-CM | POA: Diagnosis present

## 2022-10-31 DIAGNOSIS — Z79899 Other long term (current) drug therapy: Secondary | ICD-10-CM | POA: Diagnosis not present

## 2022-10-31 DIAGNOSIS — R6520 Severe sepsis without septic shock: Secondary | ICD-10-CM | POA: Diagnosis present

## 2022-10-31 DIAGNOSIS — K766 Portal hypertension: Secondary | ICD-10-CM | POA: Diagnosis present

## 2022-10-31 DIAGNOSIS — K219 Gastro-esophageal reflux disease without esophagitis: Secondary | ICD-10-CM | POA: Diagnosis present

## 2022-10-31 DIAGNOSIS — E1165 Type 2 diabetes mellitus with hyperglycemia: Secondary | ICD-10-CM | POA: Diagnosis present

## 2022-10-31 DIAGNOSIS — K7460 Unspecified cirrhosis of liver: Secondary | ICD-10-CM | POA: Diagnosis present

## 2022-10-31 DIAGNOSIS — Z7985 Long-term (current) use of injectable non-insulin antidiabetic drugs: Secondary | ICD-10-CM | POA: Diagnosis not present

## 2022-10-31 DIAGNOSIS — Z8249 Family history of ischemic heart disease and other diseases of the circulatory system: Secondary | ICD-10-CM | POA: Diagnosis not present

## 2022-10-31 DIAGNOSIS — J189 Pneumonia, unspecified organism: Secondary | ICD-10-CM | POA: Diagnosis present

## 2022-10-31 DIAGNOSIS — G9340 Encephalopathy, unspecified: Secondary | ICD-10-CM | POA: Insufficient documentation

## 2022-10-31 DIAGNOSIS — A419 Sepsis, unspecified organism: Principal | ICD-10-CM | POA: Diagnosis present

## 2022-10-31 DIAGNOSIS — E872 Acidosis, unspecified: Secondary | ICD-10-CM | POA: Diagnosis present

## 2022-10-31 DIAGNOSIS — Z825 Family history of asthma and other chronic lower respiratory diseases: Secondary | ICD-10-CM | POA: Diagnosis not present

## 2022-10-31 DIAGNOSIS — J9601 Acute respiratory failure with hypoxia: Secondary | ICD-10-CM | POA: Diagnosis present

## 2022-10-31 DIAGNOSIS — D696 Thrombocytopenia, unspecified: Secondary | ICD-10-CM | POA: Diagnosis present

## 2022-10-31 DIAGNOSIS — Z1152 Encounter for screening for COVID-19: Secondary | ICD-10-CM | POA: Diagnosis not present

## 2022-10-31 DIAGNOSIS — Z792 Long term (current) use of antibiotics: Secondary | ICD-10-CM | POA: Diagnosis not present

## 2022-10-31 DIAGNOSIS — R4182 Altered mental status, unspecified: Secondary | ICD-10-CM

## 2022-10-31 DIAGNOSIS — G9341 Metabolic encephalopathy: Secondary | ICD-10-CM | POA: Diagnosis present

## 2022-10-31 LAB — CBC WITH DIFFERENTIAL/PLATELET
Abs Immature Granulocytes: 0.01 10*3/uL (ref 0.00–0.07)
Basophils Absolute: 0 10*3/uL (ref 0.0–0.1)
Basophils Relative: 0 %
Eosinophils Absolute: 0.1 10*3/uL (ref 0.0–0.5)
Eosinophils Relative: 1 %
HCT: 36 % — ABNORMAL LOW (ref 39.0–52.0)
Hemoglobin: 12.4 g/dL — ABNORMAL LOW (ref 13.0–17.0)
Immature Granulocytes: 0 %
Lymphocytes Relative: 7 %
Lymphs Abs: 0.4 10*3/uL — ABNORMAL LOW (ref 0.7–4.0)
MCH: 31.9 pg (ref 26.0–34.0)
MCHC: 34.4 g/dL (ref 30.0–36.0)
MCV: 92.5 fL (ref 80.0–100.0)
Monocytes Absolute: 0.3 10*3/uL (ref 0.1–1.0)
Monocytes Relative: 5 %
Neutro Abs: 4.6 10*3/uL (ref 1.7–7.7)
Neutrophils Relative %: 87 %
Platelets: 60 10*3/uL — ABNORMAL LOW (ref 150–400)
RBC: 3.89 MIL/uL — ABNORMAL LOW (ref 4.22–5.81)
RDW: 13.9 % (ref 11.5–15.5)
WBC: 5.4 10*3/uL (ref 4.0–10.5)
nRBC: 0 % (ref 0.0–0.2)

## 2022-10-31 LAB — PROCALCITONIN: Procalcitonin: <0.1 ng/mL

## 2022-10-31 LAB — RESP PANEL BY RT-PCR (RSV, FLU A&B, COVID)  RVPGX2
Influenza A by PCR: NEGATIVE
Influenza B by PCR: NEGATIVE
Resp Syncytial Virus by PCR: NEGATIVE
SARS Coronavirus 2 by RT PCR: NEGATIVE

## 2022-10-31 LAB — COMPREHENSIVE METABOLIC PANEL
ALT: 23 U/L (ref 0–44)
AST: 45 U/L — ABNORMAL HIGH (ref 15–41)
Albumin: 3.3 g/dL — ABNORMAL LOW (ref 3.5–5.0)
Alkaline Phosphatase: 69 U/L (ref 38–126)
Anion gap: 6 (ref 5–15)
BUN: 20 mg/dL (ref 8–23)
CO2: 23 mmol/L (ref 22–32)
Calcium: 8 mg/dL — ABNORMAL LOW (ref 8.9–10.3)
Chloride: 106 mmol/L (ref 98–111)
Creatinine, Ser: 0.94 mg/dL (ref 0.61–1.24)
GFR, Estimated: >60 mL/min (ref >60)
Glucose, Bld: 348 mg/dL — ABNORMAL HIGH (ref 70–99)
Potassium: 3.8 mmol/L (ref 3.5–5.1)
Sodium: 135 mmol/L (ref 135–145)
Total Bilirubin: 1.2 mg/dL (ref 0.3–1.2)
Total Protein: 6.7 g/dL (ref 6.5–8.1)

## 2022-10-31 LAB — URINALYSIS, ROUTINE W REFLEX MICROSCOPIC
Bacteria, UA: NONE SEEN
Bilirubin Urine: NEGATIVE
Glucose, UA: 500 mg/dL — AB
Ketones, ur: NEGATIVE mg/dL
Leukocytes,Ua: NEGATIVE
Nitrite: NEGATIVE
Protein, ur: NEGATIVE mg/dL
Specific Gravity, Urine: 1.015 (ref 1.005–1.030)
pH: 5.5 (ref 5.0–8.0)

## 2022-10-31 LAB — TROPONIN I (HIGH SENSITIVITY)
Troponin I (High Sensitivity): 12 ng/L (ref <18)
Troponin I (High Sensitivity): 13 ng/L (ref <18)
Troponin I (High Sensitivity): 41 ng/L — ABNORMAL HIGH (ref <18)

## 2022-10-31 LAB — BLOOD GAS, VENOUS
Acid-Base Excess: 0 mmol/L (ref 0.0–2.0)
Bicarbonate: 25.4 mmol/L (ref 20.0–28.0)
O2 Saturation: 93 %
Patient temperature: 37
pCO2, Ven: 43 mmHg — ABNORMAL LOW (ref 44–60)
pH, Ven: 7.38 (ref 7.25–7.43)
pO2, Ven: 63 mmHg — ABNORMAL HIGH (ref 32–45)

## 2022-10-31 LAB — TECHNOLOGIST SMEAR REVIEW: Plt Morphology: DECREASED

## 2022-10-31 LAB — CBG MONITORING, ED: Glucose-Capillary: 373 mg/dL — ABNORMAL HIGH (ref 70–99)

## 2022-10-31 LAB — PROTIME-INR
INR: 1.3 — ABNORMAL HIGH (ref 0.8–1.2)
Prothrombin Time: 16.8 seconds — ABNORMAL HIGH (ref 11.4–15.2)

## 2022-10-31 LAB — LACTIC ACID, PLASMA
Lactic Acid, Venous: 2.6 mmol/L (ref 0.5–1.9)
Lactic Acid, Venous: 3.7 mmol/L (ref 0.5–1.9)
Lactic Acid, Venous: 1.5 mmol/L (ref 0.5–1.9)

## 2022-10-31 LAB — AMMONIA: Ammonia: 40 umol/L — ABNORMAL HIGH (ref 9–35)

## 2022-10-31 LAB — MAGNESIUM: Magnesium: 1.8 mg/dL (ref 1.7–2.4)

## 2022-10-31 LAB — APTT: aPTT: 32 seconds (ref 24–36)

## 2022-10-31 MED ORDER — ENOXAPARIN SODIUM 40 MG/0.4ML IJ SOSY: 40.0000 mg | PREFILLED_SYRINGE | INTRAMUSCULAR | Status: DC

## 2022-10-31 MED ORDER — SODIUM CHLORIDE 0.9 % IV BOLUS
1000.0000 mL | Freq: Once | INTRAVENOUS | Status: AC
  Administered 2022-10-31: 1000 mL via INTRAVENOUS

## 2022-10-31 MED ORDER — OXYCODONE HCL 5 MG PO TABS
20.0000 mg | ORAL_TABLET | Freq: Four times a day (QID) | ORAL | Status: DC | PRN
  Administered 2022-11-01: 20 mg via ORAL
  Filled 2022-10-31: qty 4

## 2022-10-31 MED ORDER — VANCOMYCIN HCL 2000 MG/400ML IV SOLN
2000.0000 mg | Freq: Once | INTRAVENOUS | Status: AC
  Administered 2022-10-31: 2000 mg via INTRAVENOUS
  Filled 2022-10-31: qty 400

## 2022-10-31 MED ORDER — SODIUM CHLORIDE 0.9 % IV SOLN
100.0000 mg | Freq: Two times a day (BID) | INTRAVENOUS | Status: DC
  Administered 2022-10-31 – 2022-11-02 (×3): 100 mg via INTRAVENOUS
  Filled 2022-10-31 (×6): qty 100

## 2022-10-31 MED ORDER — SODIUM CHLORIDE 0.9 % IV SOLN: 500.0000 mg | INTRAVENOUS | Status: DC

## 2022-10-31 MED ORDER — VANCOMYCIN HCL IN DEXTROSE 1-5 GM/200ML-% IV SOLN
1000.0000 mg | Freq: Once | INTRAVENOUS | Status: DC
  Filled 2022-10-31: qty 200

## 2022-10-31 MED ORDER — INSULIN GLARGINE-YFGN 100 UNIT/ML ~~LOC~~ SOLN
20.0000 [IU] | Freq: Every day | SUBCUTANEOUS | Status: DC
  Administered 2022-10-31: 20 [IU] via SUBCUTANEOUS
  Filled 2022-10-31 (×3): qty 0.2

## 2022-10-31 MED ORDER — INSULIN ASPART 100 UNIT/ML IJ SOLN
0.0000 [IU] | INTRAMUSCULAR | Status: DC
  Administered 2022-10-31: 20 [IU] via SUBCUTANEOUS
  Administered 2022-11-01: 15 [IU] via SUBCUTANEOUS
  Administered 2022-11-01 (×2): 7 [IU] via SUBCUTANEOUS
  Administered 2022-11-01: 4 [IU] via SUBCUTANEOUS
  Filled 2022-10-31 (×6): qty 1

## 2022-10-31 MED ORDER — LACTATED RINGERS IV BOLUS
500.0000 mL | Freq: Once | INTRAVENOUS | Status: AC
  Administered 2022-10-31: 500 mL via INTRAVENOUS

## 2022-10-31 MED ORDER — ONDANSETRON HCL 4 MG PO TABS: 4.0000 mg | ORAL_TABLET | Freq: Four times a day (QID) | ORAL | Status: DC | PRN

## 2022-10-31 MED ORDER — LACTATED RINGERS IV SOLN: INTRAVENOUS | Status: DC

## 2022-10-31 MED ORDER — METHADONE HCL 10 MG PO TABS
10.0000 mg | ORAL_TABLET | Freq: Three times a day (TID) | ORAL | Status: DC
  Administered 2022-10-31 – 2022-11-02 (×6): 10 mg via ORAL
  Filled 2022-10-31 (×6): qty 1

## 2022-10-31 MED ORDER — SODIUM CHLORIDE 0.9 % IV SOLN
2.0000 g | INTRAVENOUS | Status: DC
  Administered 2022-10-31 – 2022-11-01 (×2): 2 g via INTRAVENOUS
  Filled 2022-10-31: qty 20
  Filled 2022-10-31: qty 2

## 2022-10-31 MED ORDER — SODIUM CHLORIDE 0.9 % IV SOLN
2.0000 g | Freq: Once | INTRAVENOUS | Status: AC
  Administered 2022-10-31: 2 g via INTRAVENOUS
  Filled 2022-10-31: qty 12.5

## 2022-10-31 MED ORDER — ACETAMINOPHEN 500 MG PO TABS
1000.0000 mg | ORAL_TABLET | Freq: Once | ORAL | Status: AC
  Administered 2022-10-31: 1000 mg via ORAL
  Filled 2022-10-31: qty 2

## 2022-10-31 MED ORDER — RIFAXIMIN 550 MG PO TABS
550.0000 mg | ORAL_TABLET | Freq: Two times a day (BID) | ORAL | Status: DC
  Administered 2022-10-31 – 2022-11-02 (×4): 550 mg via ORAL
  Filled 2022-10-31 (×5): qty 1

## 2022-10-31 MED ORDER — LACTATED RINGERS IV SOLN
150.0000 mL/h | INTRAVENOUS | Status: DC
  Administered 2022-10-31: 150 mL/h via INTRAVENOUS

## 2022-10-31 MED ORDER — ONDANSETRON HCL 4 MG/2ML IJ SOLN: 4.0000 mg | Freq: Four times a day (QID) | INTRAMUSCULAR | Status: DC | PRN

## 2022-10-31 MED ORDER — METRONIDAZOLE 500 MG/100ML IV SOLN
500.0000 mg | Freq: Once | INTRAVENOUS | Status: AC
  Administered 2022-10-31: 500 mg via INTRAVENOUS
  Filled 2022-10-31: qty 100

## 2022-10-31 NOTE — Assessment & Plan Note (Signed)
BP stable Titrate home regimen 

## 2022-10-31 NOTE — Assessment & Plan Note (Signed)
Acute encephalopathy in the setting of sepsis and chronic hepatic hyperammonemia Unclear baseline Mildly lethargic on presentation Ammonia level 40 Continue lactulose and rifaximin Treat sepsis Minimize narcotics Follow

## 2022-10-31 NOTE — Progress Notes (Signed)
PHARMACY -  BRIEF ANTIBIOTIC NOTE   Pharmacy has received consult(s) for Vancomycin and cefepime from an ED provider.  The patient's profile has been reviewed for ht/wt/allergies/indication/available labs.    One time order(s) placed for : Cefepime 2mg  IV x 1 dose Vancomycin 2000mg  IV x 1 dose  Further antibiotics/pharmacy consults should be ordered by admitting physician if indicated.                       Thank you, Anamae Rochelle Rodriguez-Guzman PharmD, BCPS 10/31/2022 11:47 AM

## 2022-10-31 NOTE — H&P (Addendum)
History and Physical    Patient: Michael Mack:[id number] DOB: [birth year] DOA: 10/31/2022 DOS: the patient was seen and examined on 10/31/2022 PCP: Patient, No Pcp Per  Patient coming from: Home  Chief Complaint:  Chief Complaint  Patient presents with   Code Sepsis   HPI: Michael Mack is a 62 y.o. male with medical history significant of morbid obesity, cirrhosis, hepatic encephalopathy, hyperammonemia, chronic pain, type 2 diabetes, thrombocytopenia presenting with sepsis, pneumonia.  Limited history in the setting of encephalopathy.  Per report, patient with worsening lethargy and fatigue over the past 24 hours.  Mild malaise.  Minimal cough.  No overt fevers or chills.  No nausea or vomiting.  No abdominal pain.  No hemiparesis or confusion.  Non-smoker.  No reported diarrhea. Presented to ER Tmax one 1.5, heart rate 100s, BP stable, satting in the mid 80s on room air, transition to 6 L nasal cannula to keep O2 sats greater than 96%.  White count 5.4, hemoglobin 12.4, platelet count 60 with baseline platelets around the 120s.  Lactate 2.6.  Procalcitonin within normal notes.  Chest x-ray with right lower lobe pneumonia. Review of Systems: As mentioned in the history of present illness. All other systems reviewed and are negative. Past Medical History:  Diagnosis Date   Bulging lumbar disc    Chronic pain    Diabetes mellitus without complication (HCC)    GERD (gastroesophageal reflux disease)    Hepatic encephalopathy (HCC)    Hepatitis C    Hepatocellular carcinoma (HCC)    Hypertension    Migraines    Morbid obesity (HCC)    Thrombocytopenia (HCC)    Past Surgical History:  Procedure Laterality Date   KNEE SURGERY     Right Knee x 3, Left Knee x 1   RADIOFREQUENCY ABLATION LIVER TUMOR  03/2021   Rosa reported that he had part of his liver "burned off" a year ago due to cancer   Social History:  reports that he has never smoked. He has never used smokeless tobacco. He  reports that he does not currently use alcohol. He reports that he does not use drugs.  No Known Allergies  Family History  Problem Relation Age of Onset   COPD Mother    Cancer Mother        lung   Heart disease Father    Heart attack Father 34   Heart attack Brother 28   Other Brother        brain tumor   Other Maternal Grandmother        unknown medical history   Other Maternal Grandfather        unknown medical history   Other Paternal Grandmother        unknown medical history   Other Paternal Grandfather        unknown medical history    Prior to Admission medications   Medication Sig Start Date End Date Taking? Authorizing Provider  acetaminophen (TYLENOL) 500 MG tablet Take 500 mg by mouth as needed for headache. As needed    [provider]  amitriptyline (ELAVIL) 50 MG tablet 100 mg at bedtime as needed for sleep.    [provider]  Beta Carotene (VITAMIN A) 25000 UNIT capsule Take 25,000 Units by mouth daily.    [provider]  Cholecalciferol 25 MCG (1000 UT) tablet Take 1,000 Units by mouth daily.    [provider]  hydrochlorothiazide (HYDRODIURIL) 25 MG tablet TAKE ONE TABLET  BY MOUTH ONCE EVERY DAY. 05/20/22   Iloabachie, Chioma E, NP  insulin glargine (LANTUS) 100 UNIT/ML Solostar Pen Inject 42 Units into the skin at bedtime. 07/30/22   Iloabachie, Chioma E, NP  insulin glulisine (APIDRA SOLOSTAR) 100 UNIT/ML Solostar Pen Inject 20 Units into the skin with breakfast, with lunch, and with evening meal. 07/30/22   Iloabachie, Chioma E, NP  Insulin Pen Needle (COMFORT EZ PEN NEEDLES) 32G X 4 MM MISC Use as directed with Insulin 05/13/22   Iloabachie, Chioma E, NP  lactulose (CHRONULAC) 10 GM/15ML solution TAKE BY MOUTH 3 TIMES DAILY. 07/08/22 07/08/23  Iloabachie, Chioma E, NP  lisinopril (ZESTRIL) 40 MG tablet TAKE ONE TABLET BY MOUTH ONCE EVERY DAY. 05/20/22   Iloabachie, Chioma E, NP  methadone (DOLOPHINE) 10 MG tablet 30 mg  every 8 (eight) hours as needed for moderate pain or severe pain.    [provider]  multivitamin-iron-minerals-folic acid (CENTRUM) chewable tablet Chew 1 tablet by mouth daily.    [provider]  naloxone [facility]) nasal spray 4 mg/0.1 mL Place into the nose. 05/26/19   [provider]  nitroGLYCERIN (NITROSTAT) 0.4 MG SL tablet Place 1 tablet (0.4 mg total) under the tongue once every 5 (five) minutes for up to 3 doses as needed for chest pain. 08/20/21 07/30/22  Virl Axe, MD  Oxycodone HCl 20 MG TABS Take 1 tablet by mouth every 4 (four) hours as needed. Prescribed by pain management 06/12/21   [provider]  Semaglutide,0.25 or 0.5MG /DOS, (OZEMPIC, 0.25 OR 0.5 MG/DOSE,) 2 MG/1.5ML SOPN Inject 0.25 mg into the skin once a week. Patient not taking: Reported on 03/10/2022 12/31/21   Andreas Ohm, NP  Semaglutide,0.25 or 0.5MG /DOS, 2 MG/3ML SOPN Inject 0.25 mg into the skin once a week. O.25 mg weekly x 4 weeks 07/30/22   Iloabachie, Chioma E, NP  Semaglutide,0.25 or 0.5MG /DOS, 2 MG/3ML SOPN Inject 0.5 mg into the skin once a week. 0.5 mg start on week 5 07/30/22   Iloabachie, Chioma E, NP  XIFAXAN 550 MG TABS tablet Take 1 tablet (550 mg total) by mouth 2 (two) times daily. 09/23/20   Rodman Key, FNP  insulin lispro (HUMALOG KWIKPEN) 100 UNIT/ML KwikPen Inject 30 Units into the skin 3 (three) times daily before meals. Patient taking differently: Inject 30 Units into the skin 3 (three) times daily before meals. Taking to bridge until Apidra available 07/30/21 10/09/21  Rolm Gala, NP    Physical Exam: Vitals:   10/31/22 1130 10/31/22 1132 10/31/22 1133 10/31/22 1138  BP:  (!) 141/77    Pulse:  (!) 109    Resp:  19    Temp:    (!) 101.5 F (38.6 C)  TempSrc:  Oral  Oral  SpO2: (!) 84% 96%    Weight:   118.6 kg   Height:   5\' 7"  (1.702 m)    Physical Exam Constitutional:      Appearance: He is obese.  HENT:     Head: Normocephalic  and atraumatic.     Nose: Nose normal.     Mouth/Throat:     Mouth: Mucous membranes are moist.  Eyes:     Pupils: Pupils are equal, round, and reactive to light.  Cardiovascular:     Rate and Rhythm: Normal rate and regular rhythm.  Pulmonary:     Effort: Pulmonary effort is normal.  Abdominal:     General: Bowel sounds are normal.  Musculoskeletal:  General: Normal range of motion.     Cervical back: Normal range of motion.  Skin:    General: Skin is warm.  Neurological:     General: No focal deficit present.  Psychiatric:        Mood and Affect: Mood normal.     Data Reviewed:  There are no new results to review at this time. CT Head Wo Contrast CLINICAL DATA:  Mental status change with unknown cause.  EXAM: CT HEAD WITHOUT CONTRAST  TECHNIQUE: Contiguous axial images were obtained from the base of the skull through the vertex without intravenous contrast.  RADIATION DOSE REDUCTION: This exam was performed according to the departmental dose-optimization program which includes automated exposure control, adjustment of the mA and/or kV according to patient size and/or use of iterative reconstruction technique.  COMPARISON:  None Available.  FINDINGS: Brain: No evidence of acute infarction, hemorrhage, hydrocephalus, extra-axial collection or mass lesion/mass effect. Generalized brain atrophy.  Vascular: No hyperdense vessel or unexpected calcification.  Skull: Normal. Negative for fracture or focal lesion.  Sinuses/Orbits: Mild mucosal thickening with secretions in the right maxillary sinus.  IMPRESSION: Aging brain without acute or interval finding.  Electronically Signed   By: Tiburcio Pea M.D.   On: 10/31/2022 13:05 DG Chest Portable 1 View CLINICAL DATA:  Shortness of breath and fever  EXAM: PORTABLE CHEST 1 VIEW  COMPARISON:  07/23/2021  FINDINGS: Elevated right diaphragm. Right lower lobe opacity with borderline increase. Clear  left lung. Normal heart size.Linear densities in the right liver from embolization material.  IMPRESSION: Atelectasis or pneumonia at the right lower lobe where there is chronic elevation of the diaphragm.  Electronically Signed   By: Tiburcio Pea M.D.   On: 10/31/2022 12:05  Lab Results  Component Value Date   WBC 5.4 10/31/2022   HGB 12.4 (L) 10/31/2022   HCT 36.0 (L) 10/31/2022   MCV 92.5 10/31/2022   PLT 60 (L) 10/31/2022   Last metabolic panel Lab Results  Component Value Date   GLUCOSE 348 (H) 10/31/2022   NA 135 10/31/2022   K 3.8 10/31/2022   CL 106 10/31/2022   CO2 23 10/31/2022   BUN 20 10/31/2022   CREATININE 0.94 10/31/2022   GFRNONAA >60 10/31/2022   CALCIUM 8.0 (L) 10/31/2022   PHOS 4.0 07/25/2021   PROT 6.7 10/31/2022   ALBUMIN 3.3 (L) 10/31/2022   LABGLOB 3.0 12/24/2021   AGRATIO 1.4 12/24/2021   BILITOT 1.2 10/31/2022   ALKPHOS 69 10/31/2022   AST 45 (H) 10/31/2022   ALT 23 10/31/2022   ANIONGAP 6 10/31/2022    Assessment and Plan: * Sepsis (HCC) Meet sepsis criteria by troponin 1.5 and heart rate 100s White count 5.4 Lactate 2.6 Noted respiratory source with right-sided pneumonia Status post broad-spectrum coverage in the ER Will transition to IV Rocephin and doxycycline for infectious coverage Panculture Trend lactate Monitor IV fluid hydration in the setting of cirrhosis  Pneumonia Right lower lobe pneumonia on chest x-ray in setting of presentation with sepsis Procalcitonin within normal limits COVID flu and RSV negative IV Rocephin and doxycycline Blood cultures Urine strep Legionella Sputum culture Expanded respiratory panel    Acute encephalopathy Acute encephalopathy in the setting of sepsis and chronic hepatic hyperammonemia Unclear baseline Mildly lethargic on presentation Ammonia level 40 Continue lactulose and rifaximin Treat sepsis Minimize narcotics Follow  Chronic pain On methadone and as needed  oxycodone Titrate pain regimen as patient is somewhat lethargic on presentation  Cirrhosis (HCC) Baseline liver  cirrhosis with portal hypertension hyperammonemia No abdominal distention or tenderness palpation concerning for SBP or ascites at present LFTs stable Ammonia level 40 Continue lactulose and rifaximin Monitor  Thrombocytopenia (HCC) Platelet count 60s today in the setting of sepsis with chronic normocytic anemia associated with cirrhosis with baseline platelet count around the 100s Hold anticoagulation Check peripheral smear Heme-onc consult as clinically indicated   Hypertension BP stable  Titrate home regimen    GERD (gastroesophageal reflux disease) PPI  Diabetes (HCC) SSI A1c      Advance Care Planning:   Code Status: Full Code   Consults: None   Family Communication: Wife at the bedside   Severity of Illness: The appropriate patient status for this patient is INPATIENT. Inpatient status is judged to be reasonable and necessary in order to provide the required intensity of service to ensure the patient's safety. The patient's presenting symptoms, physical exam findings, and initial radiographic and laboratory data in the context of their chronic comorbidities is felt to place them at high risk for further clinical deterioration. Furthermore, it is not anticipated that the patient will be medically stable for discharge from the hospital within 2 midnights of admission.   * I certify that at the point of admission it is my clinical judgment that the patient will require inpatient hospital care spanning beyond 2 midnights from the point of admission due to high intensity of service, high risk for further deterioration and high frequency of surveillance required.*  Author: Floydene Flock, MD 10/31/2022 3:27 PM  For on call review [email or web address].

## 2022-10-31 NOTE — Assessment & Plan Note (Signed)
Baseline liver cirrhosis with portal hypertension hyperammonemia No abdominal distention or tenderness palpation concerning for SBP or ascites at present LFTs stable Ammonia level 40 Continue lactulose and rifaximin Monitor

## 2022-10-31 NOTE — Assessment & Plan Note (Signed)
SSI A1c 

## 2022-10-31 NOTE — Progress Notes (Incomplete)
       CROSS COVER NOTE  NAME: Michael Mack MRN: [id number] DOB : 11/08/1960    HPI/Events of Note   Report:sign out patient admitted with sepsis, possible pneumonia as source. Significantly elevated lactic not improving with fluids. Chronic thrombocytopenia  On review of chart:hx significant for cirrhosis, diabetes (HGB A1C 8.9 07/2022) ,  Hep C,  hepatocellular carcinoma (sp treated with percutaneous microwave ablation, bland embolization and RT due for follow up 3 months) thrombocytopenia, chronic pain, GERD, hyperammoniaemia Chest xray atelectasis or penumoina RLL raised right hemiciapghragm seen on prior imaging Procal <0.10    Assessment and  Interventions   Assessment: Alert and oriented, Acidosis resolved Hemodynamically stable Sats stable with minimal oxygen require ments Transfer to medsurg with tele Plan: Diabetes - home semglutide previously ordered. Start q 4h  high dose insulin scale   Lactic acidosis resolved Hyperglycemia - normal anion gap but could be contributory to weakness and a developing acidosis - started high dose sliding scale SQ insulin every 4h.      Donnie Mesa NP [facility] Cross Cover 7pm-7am - check amion for availability Pager [phone]

## 2022-10-31 NOTE — Consult Note (Signed)
CODE SEPSIS - PHARMACY COMMUNICATION  **Broad Spectrum Antibiotics should be administered within 1 hour of Sepsis diagnosis**  Time Code Sepsis Called/Page Received: 1146  Antibiotics Ordered: Cefepime, metronidazole, and Vancomycin  Time of 1st antibiotic administration: 1154  Additional action taken by pharmacy: none  If necessary, Name of Provider/Nurse Contacted: n/a   Michael Mack PharmD, BCPS 10/31/2022 12:17 PM

## 2022-10-31 NOTE — Assessment & Plan Note (Signed)
Platelet count 60s today in the setting of sepsis with chronic normocytic anemia associated with cirrhosis with baseline platelet count around the 100s Hold anticoagulation Check peripheral smear

## 2022-10-31 NOTE — Assessment & Plan Note (Signed)
Meet sepsis criteria by troponin 1.5 and heart rate 100s White count 5.4 Lactate 2.6 Noted respiratory source with right-sided pneumonia Status post broad-spectrum coverage in the ER Will transition to IV Rocephin and doxycycline for infectious coverage Panculture Trend lactate Monitor IV fluid hydration in the setting of cirrhosis

## 2022-10-31 NOTE — ED Provider Notes (Signed)
[facility] Provider Note    Event Date/Time   First MD Initiated Contact with Patient 10/31/22 1126     (approximate)   History   Chief Complaint Code Sepsis   HPI  SASHANK Mack is a 62 y.o. male with past medical history of hypertension, diabetes, hep C, cirrhosis, and hepatocellular carcinoma who presents to the ED complaining of weakness.  Patient reports that he has been feeling increasingly weak for the past 2 days, noted by family to be increasingly confused this morning.  He was noted to be febrile, tachycardic, and tachypneic with EMS, code sepsis activated prior to arrival.  He was given 500 cc of fluids prior to arrival, noted to have room air saturations of 84% and placed on 6 L nasal cannula.  On arrival, patient denies any complaints other than generalized weakness.  He specifically denies any fevers, cough, shortness of breath, abdominal pain, nausea, vomiting, diarrhea, or dysuria.     Physical Exam   Triage Vital Signs: ED Triage Vitals  Enc Vitals Group     BP 10/31/22 1132 (!) 141/77     Pulse Rate 10/31/22 1132 (!) 109     Resp 10/31/22 1132 19     Temp 10/31/22 1138 (!) 101.5 F (38.6 C)     Temp Source 10/31/22 1132 Oral     SpO2 10/31/22 1130 (!) 84 %     Weight 10/31/22 1133 261 lb 7.5 oz (118.6 kg)     Height 10/31/22 1133 5\' 7"  (1.702 m)     Head Circumference --      Peak Flow --      Pain Score 10/31/22 1132 0     Pain Loc --      Pain Edu? --      Excl. in GC? --     Most recent vital signs: Vitals:   10/31/22 1132 10/31/22 1138  BP: (!) 141/77   Pulse: (!) 109   Resp: 19   Temp:  (!) 101.5 F (38.6 C)  SpO2: 96%     Constitutional: Alert and oriented to person and place, but not time or situation. Eyes: Conjunctivae are normal. Head: Atraumatic. Nose: No congestion/rhinnorhea. Mouth/Throat: Mucous membranes are moist.  Cardiovascular: Tachycardic, regular rhythm. Grossly normal heart sounds.  2+ radial  pulses bilaterally. Respiratory: Normal respiratory effort.  No retractions. Lungs CTAB. Gastrointestinal: Soft and nontender. No distention. Musculoskeletal: No lower extremity tenderness nor edema.  Neurologic:  Normal speech and language. No gross focal neurologic deficits are appreciated.    ED Results / Procedures / Treatments   Labs (all labs ordered are listed, but only abnormal results are displayed) Labs Reviewed  COMPREHENSIVE METABOLIC PANEL - Abnormal; Notable for the following components:      Result Value   Glucose, Bld 348 (*)    Calcium 8.0 (*)    Albumin 3.3 (*)    AST 45 (*)    All other components within normal limits  LACTIC ACID, PLASMA - Abnormal; Notable for the following components:   Lactic Acid, Venous 2.6 (*)    All other components within normal limits  CBC WITH DIFFERENTIAL/PLATELET - Abnormal; Notable for the following components:   RBC 3.89 (*)    Hemoglobin 12.4 (*)    HCT 36.0 (*)    Platelets 60 (*)    Lymphs Abs 0.4 (*)    All other components within normal limits  PROTIME-INR - Abnormal; Notable for the following components:  Prothrombin Time 16.8 (*)    INR 1.3 (*)    All other components within normal limits  AMMONIA - Abnormal; Notable for the following components:   Ammonia 40 (*)    All other components within normal limits  BLOOD GAS, VENOUS - Abnormal; Notable for the following components:   pCO2, Ven 43 (*)    pO2, Ven 63 (*)    All other components within normal limits  RESP PANEL BY RT-PCR (RSV, FLU A&B, COVID)  RVPGX2  CULTURE, BLOOD (ROUTINE X 2)  CULTURE, BLOOD (ROUTINE X 2)  PROCALCITONIN  LACTIC ACID, PLASMA  URINALYSIS, ROUTINE W REFLEX MICROSCOPIC  TROPONIN I (HIGH SENSITIVITY)  TROPONIN I (HIGH SENSITIVITY)     EKG  ED ECG REPORT I, Michael Mack, the attending physician, personally viewed and interpreted this ECG.   Date: 10/31/2022  EKG Time: 11:33  Rate: 108  Rhythm: sinus tachycardia  Axis:  Normal  Intervals: Incomplete RBBB  ST&T Change: None  RADIOLOGY Chest x-ray reviewed and interpreted by me with right lower lobe infiltrate, no edema or effusion noted.  CT head reviewed and interpreted by me with no hemorrhage or midline shift.  PROCEDURES:  Critical Care performed: Yes, see critical care procedure note(s)  .Critical Care  Performed by: Michael Noon, MD Authorized by: Michael Noon, MD   Critical care provider statement:    Critical care time (minutes):  30   Critical care time was exclusive of:  Separately billable procedures and treating other patients and teaching time   Critical care was necessary to treat or prevent imminent or life-threatening deterioration of the following conditions:  Sepsis   Critical care was time spent personally by me on the following activities:  Development of treatment plan with patient or surrogate, discussions with consultants, evaluation of patient's response to treatment, examination of patient, ordering and review of laboratory studies, ordering and review of radiographic studies, ordering and performing treatments and interventions, pulse oximetry, re-evaluation of patient's condition and review of old charts   I assumed direction of critical care for this patient from another provider in my specialty: no     Care discussed with: admitting provider      MEDICATIONS ORDERED IN ED: Medications  vancomycin (VANCOREADY) IVPB 2000 mg/400 mL (2,000 mg Intravenous New Bag/Given 10/31/22 1302)  acetaminophen (TYLENOL) tablet 1,000 mg (1,000 mg Oral Given 10/31/22 1154)  ceFEPIme (MAXIPIME) 2 g in sodium chloride 0.9 % 100 mL IVPB (0 g Intravenous Stopped 10/31/22 1304)  metroNIDAZOLE (FLAGYL) IVPB 500 mg (500 mg Intravenous New Bag/Given 10/31/22 1156)  sodium chloride 0.9 % bolus 1,000 mL (1,000 mLs Intravenous New Bag/Given 10/31/22 1154)     IMPRESSION / MDM / ASSESSMENT AND PLAN / ED COURSE  I reviewed the triage vital signs and  the nursing notes.                              62 y.o. male with past medical history of hypertension, diabetes, hep C, cirrhosis, and hepatocellular carcinoma who presents to the ED with increasing weakness and altered mental status over the past 2 days.  Patient's presentation is most consistent with acute presentation with potential threat to life or bodily function.  Differential diagnosis includes, but is not limited to, sepsis, pneumonia, UTI, SBP, cholecystitis, appendicitis, COVID-19, influenza, hepatic encephalopathy.  Patient nontoxic-appearing and in no acute distress, vital signs remarkable for fever and tachycardia.  Presentation concerning for sepsis and  we will initiate sepsis workup, started on broad-spectrum antibiotics.  He complains only of generalized weakness with no localizing symptoms, has a benign abdominal exam and I have low suspicion for SBP at this time.  Chest x-ray and urinalysis are pending in addition to lab results.  CT head is negative for acute process, chest x-ray shows right lower lobe infiltrate which could be the source of his sepsis.  Labs show hyperglycemia with no evidence of DKA or significant electrolyte abnormality, LFTs are unremarkable.  Ammonia mildly elevated at 40 but I doubt this is a significant contributor to his change in mental status.  VBG is unremarkable without significant hypercapnia.  Patient currently requiring 2 L of supplemental oxygen via nasal cannula, Case discussed with hospitalist for admission.      FINAL CLINICAL IMPRESSION(S) / ED DIAGNOSES   Final diagnoses:  Sepsis with acute hypoxic respiratory failure without septic shock, due to unspecified organism (HCC)  Pneumonia of right lower lobe due to infectious organism  Altered mental status, unspecified altered mental status type     Rx / DC Orders   ED Discharge Orders     None        Note:  This document was prepared using Dragon voice recognition software  and may include unintentional dictation errors.   Michael Noon, MD 10/31/22 [phone]

## 2022-10-31 NOTE — Assessment & Plan Note (Signed)
Right lower lobe pneumonia on chest x-ray in setting of presentation with sepsis Procalcitonin within normal limits COVID flu and RSV negative IV Rocephin and doxycycline Blood cultures Urine strep Legionella Sputum culture Expanded respiratory panel

## 2022-10-31 NOTE — Assessment & Plan Note (Signed)
PPI ?

## 2022-10-31 NOTE — Progress Notes (Signed)
Elink following for sepsis protocol. 

## 2022-10-31 NOTE — Assessment & Plan Note (Signed)
Platelet count 60s today in the setting of sepsis with chronic normocytic anemia associated with cirrhosis with baseline platelet count around the 100s Hold anticoagulation Check peripheral smear Heme-onc consult as clinically indicated

## 2022-10-31 NOTE — Progress Notes (Addendum)
Noted uptrending lactate from 2.6-->3.7 in the setting of sepsis Also with mild bump in troponin at 41 Will give LR bolus and continue LR at 150 an hour Suspect mild demand mismatch in the setting of sepsis Will trend troponin EKG x 1 Deferring antiplatelet therapy in the setting of thrombocytopenia Signout given to Manuela Schwartz, NP Follow

## 2022-10-31 NOTE — Assessment & Plan Note (Signed)
On methadone and as needed oxycodone Titrate pain regimen as patient is somewhat lethargic on presentation

## 2022-10-31 NOTE — ED Triage Notes (Signed)
Pt to ED via [facility] from home. EMS called code sepsis in the filed. Per EMS they were called out for generalized weakness for the last 2 days. This morning patient was altered. Pt was A & O x 3. Pt febrile at 101.9, tachycardic at 120, and tachypneic at 25. EMS reports pt has hx/o Cirrhosis. Pt denies pain at this time. Pt was given 500 cc on normal saline in route. Pt SpO2 was 84% on room air, 88% with 6 Liters of oxygen, and 95% on 15 liters. Pt CBG 395 with hx/o diabetes.

## 2022-11-01 LAB — CULTURE, BLOOD (ROUTINE X 2)
Special Requests: ADEQUATE
Special Requests: ADEQUATE

## 2022-11-01 LAB — COMPREHENSIVE METABOLIC PANEL
ALT: 19 U/L (ref 0–44)
AST: 30 U/L (ref 15–41)
Albumin: 2.7 g/dL — ABNORMAL LOW (ref 3.5–5.0)
Alkaline Phosphatase: 55 U/L (ref 38–126)
Anion gap: 5 (ref 5–15)
BUN: 12 mg/dL (ref 8–23)
CO2: 23 mmol/L (ref 22–32)
Calcium: 7.6 mg/dL — ABNORMAL LOW (ref 8.9–10.3)
Chloride: 109 mmol/L (ref 98–111)
Creatinine, Ser: 0.68 mg/dL (ref 0.61–1.24)
GFR, Estimated: >60 mL/min (ref >60)
Glucose, Bld: 174 mg/dL — ABNORMAL HIGH (ref 70–99)
Potassium: 3.2 mmol/L — ABNORMAL LOW (ref 3.5–5.1)
Sodium: 137 mmol/L (ref 135–145)
Total Bilirubin: 0.9 mg/dL (ref 0.3–1.2)
Total Protein: 5.7 g/dL — ABNORMAL LOW (ref 6.5–8.1)

## 2022-11-01 LAB — CBC WITH DIFFERENTIAL/PLATELET
Abs Immature Granulocytes: 0 10*3/uL (ref 0.00–0.07)
Basophils Absolute: 0 10*3/uL (ref 0.0–0.1)
Basophils Relative: 0 %
Eosinophils Absolute: 0.1 10*3/uL (ref 0.0–0.5)
Eosinophils Relative: 5 %
HCT: 32 % — ABNORMAL LOW (ref 39.0–52.0)
Hemoglobin: 10.9 g/dL — ABNORMAL LOW (ref 13.0–17.0)
Immature Granulocytes: 0 %
Lymphocytes Relative: 20 %
Lymphs Abs: 0.6 10*3/uL — ABNORMAL LOW (ref 0.7–4.0)
MCH: 31.7 pg (ref 26.0–34.0)
MCHC: 34.1 g/dL (ref 30.0–36.0)
MCV: 93 fL (ref 80.0–100.0)
Monocytes Absolute: 0.2 10*3/uL (ref 0.1–1.0)
Monocytes Relative: 7 %
Neutro Abs: 1.9 10*3/uL (ref 1.7–7.7)
Neutrophils Relative %: 68 %
Platelets: 50 10*3/uL — ABNORMAL LOW (ref 150–400)
RBC: 3.44 MIL/uL — ABNORMAL LOW (ref 4.22–5.81)
RDW: 13.6 % (ref 11.5–15.5)
WBC: 2.7 10*3/uL — ABNORMAL LOW (ref 4.0–10.5)
nRBC: 0 % (ref 0.0–0.2)

## 2022-11-01 LAB — RESPIRATORY PANEL BY PCR

## 2022-11-01 LAB — GLUCOSE, CAPILLARY
Glucose-Capillary: 230 mg/dL — ABNORMAL HIGH (ref 70–99)
Glucose-Capillary: 288 mg/dL — ABNORMAL HIGH (ref 70–99)

## 2022-11-01 LAB — TROPONIN I (HIGH SENSITIVITY): Troponin I (High Sensitivity): 30 ng/L — ABNORMAL HIGH (ref <18)

## 2022-11-01 LAB — CBG MONITORING, ED
Glucose-Capillary: 150 mg/dL — ABNORMAL HIGH (ref 70–99)
Glucose-Capillary: 163 mg/dL — ABNORMAL HIGH (ref 70–99)
Glucose-Capillary: 225 mg/dL — ABNORMAL HIGH (ref 70–99)
Glucose-Capillary: 231 mg/dL — ABNORMAL HIGH (ref 70–99)
Glucose-Capillary: 317 mg/dL — ABNORMAL HIGH (ref 70–99)

## 2022-11-01 MED ORDER — LACTULOSE 10 GM/15ML PO SOLN
20.0000 g | Freq: Two times a day (BID) | ORAL | Status: DC
  Administered 2022-11-01 – 2022-11-02 (×2): 20 g via ORAL
  Filled 2022-11-01 (×2): qty 30

## 2022-11-01 MED ORDER — INSULIN ASPART 100 UNIT/ML IJ SOLN
0.0000 [IU] | Freq: Every day | INTRAMUSCULAR | Status: DC
  Administered 2022-11-01: 3 [IU] via SUBCUTANEOUS
  Filled 2022-11-01: qty 1

## 2022-11-01 MED ORDER — INSULIN GLARGINE-YFGN 100 UNIT/ML ~~LOC~~ SOLN
30.0000 [IU] | Freq: Every day | SUBCUTANEOUS | Status: DC
  Administered 2022-11-01: 30 [IU] via SUBCUTANEOUS
  Filled 2022-11-01 (×2): qty 0.3

## 2022-11-01 MED ORDER — INSULIN ASPART 100 UNIT/ML IJ SOLN
0.0000 [IU] | Freq: Three times a day (TID) | INTRAMUSCULAR | Status: DC
  Administered 2022-11-01 – 2022-11-02 (×3): 5 [IU] via SUBCUTANEOUS
  Filled 2022-11-01 (×3): qty 1

## 2022-11-01 NOTE — Progress Notes (Signed)
  PROGRESS NOTE    [name]  WUJ:[id number] DOB: [birth year] DOA: 10/31/2022 PCP: Patient, No Pcp Per  129A/129A-AA  LOS: 1 day   Brief hospital course:   Assessment & Plan: Michael Mack is a 62 y.o. male with medical history significant of morbid obesity, cirrhosis, hepatic encephalopathy, hyperammonemia, chronic pain, type 2 diabetes, thrombocytopenia presenting with sepsis, pneumonia.  Limited history in the setting of encephalopathy.  Per report, patient with worsening lethargy and fatigue.   * Severe Sepsis (HCC) --fever, tachycardia, elevated lactic acid, pulmonary source.  RLL Pneumonia Right lower lobe pneumonia on chest x-ray in setting of presentation with sepsis Procalcitonin within normal limits COVID flu, RSV, RVP neg. --started on IV Rocephin and doxycycline Plan: --cont ceftriaxone and doxy  Acute hypoxemic respiratory failure --O2 desat to 84% on room air on presentation.  Today, still desat to 87% with walking, needed 2L O2. Plan: --treat PNA --Continue supplemental O2 to keep sats >=90%, wean as tolerated  Acute metabolic encephalopathy Mildly lethargic on presentation Ammonia level 40 --alert the day after presentation, but not oriented to time.  Chronic pain on chronic opioids --cont home methadone and oxycodone PRN while monitoring BP and mental status --monitor for BM  Cirrhosis (HCC) Baseline liver cirrhosis with portal hypertension hyperammonemia No abdominal distention or tenderness palpation concerning for SBP or ascites at present LFTs stable Ammonia level 40 --cont rifaximin --cont home lactulose  Thrombocytopenia, chronic Platelet count 60s today in the setting of sepsis with chronic normocytic anemia associated with cirrhosis with baseline platelet count around the 100s  Hypertension BP variable  Diabetes (HCC) --increase glargine to 30u nightly --ACHS and SSI   DVT prophylaxis: SCD/Compression stockings Code Status: Full  code  Family Communication:  Level of care: Med-Surg Dispo:   The patient is from: home Anticipated d/c is to: home Anticipated d/c date is: 1-2 days   Subjective and Interval History:  Pt reported feeling back to normal and asked to go home.  However, pt was found to desat to 87% while walking.     Objective: Vitals:   11/01/22 1240 11/01/22 1315 11/01/22 1400 11/01/22 1634  BP:   133/74 (!) 149/85  Pulse: 100 71 78 76  Resp: (!) 32 16 19 18   Temp:    98.4 F (36.9 C)  TempSrc:    Oral  SpO2: (!) 88% 96% 96% 98%  Weight:      Height:        Intake/Output Summary (Last 24 hours) at 11/01/2022 1710 Last data filed at 11/01/2022 0355 Gross per 24 hour  Intake 1183.33 ml  Output 500 ml  Net 683.33 ml   Filed Weights   10/31/22 1133  Weight: 118.6 kg    Examination:   Constitutional: NAD, alert, oriented to persona and place HEENT: conjunctivae and lids normal, EOMI CV: No cyanosis.   RESP: normal respiratory effort at rest Neuro: II - XII grossly intact.     Data Reviewed: I have personally reviewed labs and imaging studies  Time spent: 50 minutes  Darlin Priestly, MD [facility] If 7PM-7AM, please contact night-coverage 11/01/2022, 5:10 PM

## 2022-11-01 NOTE — ED Notes (Signed)
Advised nurse that patient has ready bed 

## 2022-11-01 NOTE — ED Notes (Signed)
Ambulated pt on room air per request from Dr Fran Lowes. Pt ambulatory without difficulty, however he did drop to 87% while walking. Pt back into bed and sitting in bed, did take 3-4 minutes for room air saturation to return to 90%. Pt visibly short of breath but denies when asked. Pt satting 89-90% room air, placed back on 2L [place] at this time. Pt was informed of possible discharge by Dr Fran Lowes, then decision was made to keep pt at least one more night. Pt unhappy with this, informed him he can leave at any time as long as he signs AMA paperwork. AMA explained, pt not attempting to leave at this time and is agreeable with admission but obviously displeased. WCTM.

## 2022-11-01 NOTE — ED Notes (Signed)
Floor notified of pt departure from ED

## 2022-11-01 NOTE — ED Notes (Signed)
Pt ambulatory to and from bathroom without difficulty. On return, pt sating 85% on room air. 2L [place] replaced.

## 2022-11-02 ENCOUNTER — Other Ambulatory Visit: Payer: Self-pay

## 2022-11-02 LAB — CBC
HCT: 34.3 % — ABNORMAL LOW (ref 39.0–52.0)
Hemoglobin: 11.9 g/dL — ABNORMAL LOW (ref 13.0–17.0)
MCH: 31.6 pg (ref 26.0–34.0)
MCHC: 34.7 g/dL (ref 30.0–36.0)
MCV: 91 fL (ref 80.0–100.0)
Platelets: 57 10*3/uL — ABNORMAL LOW (ref 150–400)
RBC: 3.77 MIL/uL — ABNORMAL LOW (ref 4.22–5.81)
RDW: 13.4 % (ref 11.5–15.5)
WBC: 2.5 10*3/uL — ABNORMAL LOW (ref 4.0–10.5)
nRBC: 0 % (ref 0.0–0.2)

## 2022-11-02 LAB — BASIC METABOLIC PANEL
Anion gap: 6 (ref 5–15)
BUN: 13 mg/dL (ref 8–23)
CO2: 26 mmol/L (ref 22–32)
Calcium: 8.2 mg/dL — ABNORMAL LOW (ref 8.9–10.3)
Chloride: 106 mmol/L (ref 98–111)
Creatinine, Ser: 0.69 mg/dL (ref 0.61–1.24)
GFR, Estimated: >60 mL/min (ref >60)
Glucose, Bld: 215 mg/dL — ABNORMAL HIGH (ref 70–99)
Potassium: 3.6 mmol/L (ref 3.5–5.1)
Sodium: 138 mmol/L (ref 135–145)

## 2022-11-02 LAB — MAGNESIUM: Magnesium: 1.7 mg/dL (ref 1.7–2.4)

## 2022-11-02 LAB — GLUCOSE, CAPILLARY
Glucose-Capillary: 245 mg/dL — ABNORMAL HIGH (ref 70–99)
Glucose-Capillary: 229 mg/dL — ABNORMAL HIGH (ref 70–99)

## 2022-11-02 LAB — HIV ANTIBODY (ROUTINE TESTING W REFLEX): HIV Screen 4th Generation wRfx: NONREACTIVE

## 2022-11-02 MED ORDER — LEVOFLOXACIN 500 MG PO TABS
500.0000 mg | ORAL_TABLET | Freq: Every day | ORAL | 0 refills | Status: AC
  Filled 2022-11-02: qty 2, 2d supply, fill #0

## 2022-11-02 MED ORDER — SODIUM CHLORIDE 0.9 % IV SOLN
2.0000 g | INTRAVENOUS | Status: DC
  Administered 2022-11-02: 2 g via INTRAVENOUS
  Filled 2022-11-02 (×2): qty 20
  Filled 2022-11-02: qty 2

## 2022-11-02 NOTE — Progress Notes (Signed)
SATURATION QUALIFICATIONS: (This note is used to comply with regulatory documentation for home oxygen)  Patient Saturations on Room Air at Rest = 93%  Patient Saturations on Room Air while Ambulating = 92-95%  Patient Saturations on 0 Liters of oxygen while Ambulating = 92-95%  Please briefly explain why patient needs home oxygen:

## 2022-11-02 NOTE — Plan of Care (Signed)
Patient is adequate for discharge to prior environment with support from friends.

## 2022-11-02 NOTE — Discharge Summary (Signed)
Physician Discharge Summary   Michael Mack  male DOB: September 29, 1960  ZOX:[id number]  PCP: Patient, No Pcp Per  Admit date: 10/31/2022 Discharge date: 11/02/2022  Admitted From: home Disposition:  home CODE STATUS: Full code  Discharge Instructions     Discharge instructions   Complete by: As directed    You have received 3 days of IV antibiotics for pneumonia.  Please finish 2 more days of oral antibiotic Levaquin starting 5/14.   Dr. Darlin Priestly [facility] Course:  For full details, please see H&P, progress notes, consult notes and ancillary notes.  Briefly,  Michael Mack is a 62 y.o. male with medical history significant of morbid obesity, cirrhosis, hepatic encephalopathy, chronic pain, type 2 diabetes, presenting with sepsis, pneumonia.  Limited history in the setting of encephalopathy.  Per report, patient with worsening lethargy and fatigue.   * Severe Sepsis (HCC) --fever, tachycardia, elevated lactic acid, pulmonary source.   RLL Pneumonia Right lower lobe pneumonia on chest x-ray.  Procalcitonin within normal limits COVID flu, RSV, RVP neg. --started on IV Rocephin and doxycycline.  Pt improved quickly.  Pt received 3 days of ceftriaxone and doxy and discharged on 2 more days of Levaquin.   Acute hypoxemic respiratory failure --O2 desat to 84% on room air on presentation.  Presumed due to PNA. --prior to discharge, pt was sating in 90's in room air with ambulation.   Acute metabolic encephalopathy Mildly lethargic on presentation Ammonia level 40 --alert the day after presentation, but not oriented to time.   Chronic pain on chronic opioids --cont home methadone and oxycodone PRN while monitoring BP and mental status   Cirrhosis (HCC) Baseline liver cirrhosis with portal hypertension hyperammonemia No abdominal distention or tenderness palpation concerning for SBP or ascites at present LFTs stable Ammonia level 40 --pt not taking rifaximin  PTA --cont home lactulose   Thrombocytopenia, chronic Platelet count 60s in the setting of sepsis with chronic normocytic anemia associated with cirrhosis with baseline platelet count around the 100s   Hypertension --cont home regimen as below   Diabetes (HCC) --cont home regimen as below --pt not taking Semaglutide PTA    Discharge Diagnoses:  Principal Problem:   Sepsis (HCC) Active Problems:   Pneumonia   Diabetes (HCC)   GERD (gastroesophageal reflux disease)   Hypertension   Thrombocytopenia (HCC)   Cirrhosis (HCC)   Chronic pain   Acute encephalopathy   30 Day Unplanned Readmission Risk Score    Flowsheet Row ED to Hosp-Admission (Current) from 10/31/2022 in [facility] 1C MEDICAL TELEMETRY  30 Day Unplanned Readmission Risk Score (%) 19.76 Filed at 11/02/2022 1200       This score is the patient's risk of an unplanned readmission within 30 days of being discharged (0 -100%). The score is based on dignosis, age, lab data, medications, orders, and past utilization.   Low:  0-14.9   Medium: 15-21.9   High: 22-29.9   Extreme: 30 and above         Discharge Instructions:  Allergies as of 11/02/2022   No Known Allergies      Medication List     STOP taking these medications    Ozempic (0.25 or 0.5 MG/DOSE) 2 MG/1.5ML Sopn Generic drug: Semaglutide(0.25 or 0.5MG /DOS)   Ozempic (0.25 or 0.5 MG/DOSE) 2 MG/3ML Sopn Generic drug: Semaglutide(0.25 or 0.5MG /DOS)   Semaglutide(0.25 or 0.5MG /DOS) 2 MG/3ML Sopn   Xifaxan 550 MG Tabs tablet Generic  drug: rifaximin       TAKE these medications    acetaminophen 500 MG tablet Commonly known as: TYLENOL Take 500 mg by mouth as needed for headache. As needed   amitriptyline 50 MG tablet Commonly known as: ELAVIL 100 mg at bedtime as needed for sleep.   Apidra SoloStar 100 UNIT/ML Solostar Pen Generic drug: insulin glulisine Inject 20 Units into the skin with breakfast, with lunch,  and with evening meal.   Cholecalciferol 25 MCG (1000 UT) tablet Take 1,000 Units by mouth daily.   hydrochlorothiazide 25 MG tablet Commonly known as: HYDRODIURIL TAKE ONE TABLET BY MOUTH ONCE EVERY DAY. What changed: how much to take   lactulose 10 GM/15ML solution Commonly known as: CHRONULAC TAKE BY MOUTH 3 TIMES DAILY.   Lantus SoloStar 100 UNIT/ML Solostar Pen Generic drug: insulin glargine Inject 42 Units into the skin at bedtime. What changed: how much to take   levofloxacin 500 MG tablet Commonly known as: Levaquin Take 1 tablet (500 mg total) by mouth daily for 2 days. Start taking on: Nov 03, 2022   lisinopril 40 MG tablet Commonly known as: ZESTRIL TAKE ONE TABLET BY MOUTH ONCE EVERY DAY. What changed: how much to take   methadone 10 MG tablet Commonly known as: DOLOPHINE 30 mg every 8 (eight) hours as needed for moderate pain or severe pain.   multivitamin-iron-minerals-folic acid chewable tablet Chew 1 tablet by mouth daily.   naloxone 4 MG/0.1ML Liqd nasal spray kit Commonly known as: NARCAN Place into the nose.   nitroGLYCERIN 0.4 MG SL tablet Commonly known as: NITROSTAT Place 1 tablet (0.4 mg total) under the tongue once every 5 (five) minutes for up to 3 doses as needed for chest pain.   Oxycodone HCl 20 MG Tabs Take 1 tablet by mouth every 4 (four) hours as needed. Prescribed by pain management   Unifine Pentips 32G X 4 MM Misc Generic drug: Insulin Pen Needle Use as directed with Insulin   vitamin A 16109 UNIT capsule Take 25,000 Units by mouth daily.          No Known Allergies   The results of significant diagnostics from this hospitalization (including imaging, microbiology, ancillary and laboratory) are listed below for reference.   Consultations:   Procedures/Studies: CT Head Wo Contrast  Result Date: 10/31/2022 CLINICAL DATA:  Mental status change with unknown cause. EXAM: CT HEAD WITHOUT CONTRAST TECHNIQUE:  Contiguous axial images were obtained from the base of the skull through the vertex without intravenous contrast. RADIATION DOSE REDUCTION: This exam was performed according to the departmental dose-optimization program which includes automated exposure control, adjustment of the mA and/or kV according to patient size and/or use of iterative reconstruction technique. COMPARISON:  None Available. FINDINGS: Brain: No evidence of acute infarction, hemorrhage, hydrocephalus, extra-axial collection or mass lesion/mass effect. Generalized brain atrophy. Vascular: No hyperdense vessel or unexpected calcification. Skull: Normal. Negative for fracture or focal lesion. Sinuses/Orbits: Mild mucosal thickening with secretions in the right maxillary sinus. IMPRESSION: Aging brain without acute or interval finding. Electronically Signed   By: Tiburcio Pea M.D.   On: 10/31/2022 13:05   DG Chest Portable 1 View  Result Date: 10/31/2022 CLINICAL DATA:  Shortness of breath and fever EXAM: PORTABLE CHEST 1 VIEW COMPARISON:  07/23/2021 FINDINGS: Elevated right diaphragm. Right lower lobe opacity with borderline increase. Clear left lung. Normal heart size.Linear densities in the right liver from embolization material. IMPRESSION: Atelectasis or pneumonia at the right lower lobe where there  is chronic elevation of the diaphragm. Electronically Signed   By: Tiburcio Pea M.D.   On: 10/31/2022 12:05      Labs: BNP (last 3 results) No results for input(s): "BNP" in the last 8760 hours. Basic Metabolic Panel: Recent Labs  Lab 10/31/22 1139 11/01/22 0531 11/02/22 0555  NA 135 137 138  K 3.8 3.2* 3.6  CL 106 109 106  CO2 23 23 26   GLUCOSE 348* 174* 215*  BUN 20 12 13   CREATININE 0.94 0.68 0.69  CALCIUM 8.0* 7.6* 8.2*  MG 1.8  --  1.7   Liver Function Tests: Recent Labs  Lab 10/31/22 1139 11/01/22 0531  AST 45* 30  ALT 23 19  ALKPHOS 69 55  BILITOT 1.2 0.9  PROT 6.7 5.7*  ALBUMIN 3.3* 2.7*   No  results for input(s): "LIPASE", "AMYLASE" in the last 168 hours. Recent Labs  Lab 10/31/22 1139  AMMONIA 40*   CBC: Recent Labs  Lab 10/31/22 1139 11/01/22 0531 11/02/22 0555  WBC 5.4 2.7* 2.5*  NEUTROABS 4.6 1.9  --   HGB 12.4* 10.9* 11.9*  HCT 36.0* 32.0* 34.3*  MCV 92.5 93.0 91.0  PLT 60* 50* 57*   Cardiac Enzymes: No results for input(s): "CKTOTAL", "CKMB", "CKMBINDEX", "TROPONINI" in the last 168 hours. BNP: Invalid input(s): "POCBNP" CBG: Recent Labs  Lab 11/01/22 1346 11/01/22 1632 11/01/22 2043 11/02/22 0915 11/02/22 1211  GLUCAP 231* 230* 288* 245* 229*   D-Dimer No results for input(s): "DDIMER" in the last 72 hours. Hgb A1c No results for input(s): "HGBA1C" in the last 72 hours. Lipid Profile No results for input(s): "CHOL", "HDL", "LDLCALC", "TRIG", "CHOLHDL", "LDLDIRECT" in the last 72 hours. Thyroid function studies No results for input(s): "TSH", "T4TOTAL", "T3FREE", "THYROIDAB" in the last 72 hours.  Invalid input(s): "FREET3" Anemia work up No results for input(s): "VITAMINB12", "FOLATE", "FERRITIN", "TIBC", "IRON", "RETICCTPCT" in the last 72 hours. Urinalysis    Component Value Date/Time   COLORURINE YELLOW (A) 10/31/2022 1139   APPEARANCEUR CLEAR (A) 10/31/2022 1139   APPEARANCEUR Clear 12/24/2021 1048   LABSPEC 1.015 10/31/2022 1139   PHURINE 5.5 10/31/2022 1139   GLUCOSEU 500 (A) 10/31/2022 1139   HGBUR TRACE (A) 10/31/2022 1139   BILIRUBINUR NEGATIVE 10/31/2022 1139   BILIRUBINUR Negative 12/24/2021 1048   KETONESUR NEGATIVE 10/31/2022 1139   PROTEINUR NEGATIVE 10/31/2022 1139   NITRITE NEGATIVE 10/31/2022 1139   LEUKOCYTESUR NEGATIVE 10/31/2022 1139   Sepsis Labs Recent Labs  Lab 10/31/22 1139 11/01/22 0531 11/02/22 0555  WBC 5.4 2.7* 2.5*   Microbiology Recent Results (from the past 240 hour(s))  Culture, blood (Routine x 2)     Status: None (Preliminary result)   Collection Time: 10/31/22 11:39 AM   Specimen: BLOOD   Result Value Ref Range Status   Specimen Description BLOOD LEFT ANTECUBITAL  Final   Special Requests   Final    BOTTLES DRAWN AEROBIC AND ANAEROBIC Blood Culture adequate volume   Culture   Final    NO GROWTH 2 DAYS Performed at [facility], [address] [place], [place] [address]    Report Status PENDING  Incomplete  Culture, blood (Routine x 2)     Status: None (Preliminary result)   Collection Time: 10/31/22 11:39 AM   Specimen: BLOOD  Result Value Ref Range Status   Specimen Description BLOOD BLOOD LEFT ARM  Final   Special Requests   Final    BOTTLES DRAWN AEROBIC AND ANAEROBIC Blood Culture adequate volume  Culture   Final    NO GROWTH 2 DAYS Performed at [facility], [address] [place], [place] [address]    Report Status PENDING  Incomplete  Resp panel by RT-PCR (RSV, Flu A&B, Covid) Anterior Nasal Swab     Status: None   Collection Time: 10/31/22 11:48 AM   Specimen: Anterior Nasal Swab  Result Value Ref Range Status   SARS Coronavirus 2 by RT PCR NEGATIVE NEGATIVE Final    Comment: (NOTE) SARS-CoV-2 target nucleic acids are NOT DETECTED.  The SARS-CoV-2 RNA is generally detectable in upper respiratory specimens during the acute phase of infection. The lowest concentration of SARS-CoV-2 viral copies this assay can detect is 138 copies/mL. A negative result does not preclude SARS-Cov-2 infection and should not be used as the sole basis for treatment or other patient management decisions. A negative result may occur with  improper specimen collection/handling, submission of specimen other than nasopharyngeal swab, presence of viral mutation(s) within the areas targeted by this assay, and inadequate number of viral copies(<138 copies/mL). A negative result must be combined with clinical observations, patient history, and epidemiological information. The expected result is Negative.  Fact Sheet for Patients:   BloggerCourse.com  Fact Sheet for Healthcare Providers:  SeriousBroker.it  This test is no t yet approved or cleared by the [place] FDA and  has been authorized for detection and/or diagnosis of SARS-CoV-2 by FDA under an Emergency Use Authorization (EUA). This EUA will remain  in effect (meaning this test can be used) for the duration of the COVID-19 declaration under Section 564(b)(1) of the Act, 21 U.S.C.section 360bbb-3(b)(1), unless the authorization is terminated  or revoked sooner.       Influenza A by PCR NEGATIVE NEGATIVE Final   Influenza B by PCR NEGATIVE NEGATIVE Final    Comment: (NOTE) The Xpert Xpress SARS-CoV-2/FLU/RSV plus assay is intended as an aid in the diagnosis of influenza from Nasopharyngeal swab specimens and should not be used as a sole basis for treatment. Nasal washings and aspirates are unacceptable for Xpert Xpress SARS-CoV-2/FLU/RSV testing.  Fact Sheet for Patients: BloggerCourse.com  Fact Sheet for Healthcare Providers: SeriousBroker.it  This test is not yet approved or cleared by the [place] FDA and has been authorized for detection and/or diagnosis of SARS-CoV-2 by FDA under an Emergency Use Authorization (EUA). This EUA will remain in effect (meaning this test can be used) for the duration of the COVID-19 declaration under Section 564(b)(1) of the Act, 21 U.S.C. section 360bbb-3(b)(1), unless the authorization is terminated or revoked.     Resp Syncytial Virus by PCR NEGATIVE NEGATIVE Final    Comment: (NOTE) Fact Sheet for Patients: BloggerCourse.com  Fact Sheet for Healthcare Providers: SeriousBroker.it  This test is not yet approved or cleared by the [place] FDA and has been authorized for detection and/or diagnosis of SARS-CoV-2 by FDA under an Emergency Use  Authorization (EUA). This EUA will remain in effect (meaning this test can be used) for the duration of the COVID-19 declaration under Section 564(b)(1) of the Act, 21 U.S.C. section 360bbb-3(b)(1), unless the authorization is terminated or revoked.  Performed at [facility], [address] [place], [place] [address]   Respiratory (~20 pathogens) panel by PCR     Status: None   Collection Time: 11/01/22 11:22 AM   Specimen: Nasopharyngeal Swab; Respiratory  Result Value Ref Range Status   Adenovirus NOT DETECTED NOT DETECTED Final   Coronavirus 229E NOT DETECTED NOT DETECTED Final  Comment: (NOTE) The Coronavirus on the Respiratory Panel, DOES NOT test for the novel  Coronavirus (2019 nCoV)    Coronavirus HKU1 NOT DETECTED NOT DETECTED Final   Coronavirus NL63 NOT DETECTED NOT DETECTED Final   Coronavirus OC43 NOT DETECTED NOT DETECTED Final   Metapneumovirus NOT DETECTED NOT DETECTED Final   Rhinovirus / Enterovirus NOT DETECTED NOT DETECTED Final   Influenza A NOT DETECTED NOT DETECTED Final   Influenza B NOT DETECTED NOT DETECTED Final   Parainfluenza Virus 1 NOT DETECTED NOT DETECTED Final   Parainfluenza Virus 2 NOT DETECTED NOT DETECTED Final   Parainfluenza Virus 3 NOT DETECTED NOT DETECTED Final   Parainfluenza Virus 4 NOT DETECTED NOT DETECTED Final   Respiratory Syncytial Virus NOT DETECTED NOT DETECTED Final   Bordetella pertussis NOT DETECTED NOT DETECTED Final   Bordetella Parapertussis NOT DETECTED NOT DETECTED Final   Chlamydophila pneumoniae NOT DETECTED NOT DETECTED Final   Mycoplasma pneumoniae NOT DETECTED NOT DETECTED Final    Comment: Performed at [facility], [address], [place], [place] [address]     Total time spend on discharging this patient, including the last patient exam, discussing the hospital stay, instructions for ongoing care as it relates to all pertinent caregivers, as well as preparing the medical discharge records,  prescriptions, and/or referrals as applicable, is 35 minutes.    Darlin Priestly, MD  [facility] 11/02/2022, 12:22 PM

## 2022-11-03 LAB — CULTURE, BLOOD (ROUTINE X 2): Culture: NO GROWTH

## 2022-11-04 LAB — CULTURE, BLOOD (ROUTINE X 2): Culture: NO GROWTH

## 2022-11-05 ENCOUNTER — Ambulatory Visit: Payer: Self-pay | Admitting: Gerontology

## 2022-11-05 VITALS — BP 144/77 | HR 71 | Wt 241.1 lb

## 2022-11-05 DIAGNOSIS — Z794 Long term (current) use of insulin: Secondary | ICD-10-CM

## 2022-11-05 DIAGNOSIS — E119 Type 2 diabetes mellitus without complications: Secondary | ICD-10-CM

## 2022-11-05 LAB — POCT GLYCOSYLATED HEMOGLOBIN (HGB A1C): Hemoglobin A1C: 9.2 % — AB (ref 4.0–5.6)

## 2022-11-05 LAB — CULTURE, BLOOD (ROUTINE X 2)

## 2022-11-05 LAB — GLUCOSE, POCT (MANUAL RESULT ENTRY): POC Glucose: 166 mg/dl — AB (ref 70–99)

## 2022-11-05 MED ORDER — SEMAGLUTIDE(0.25 OR 0.5MG/DOS) 2 MG/3ML ~~LOC~~ SOPN
0.5000 mg | PEN_INJECTOR | SUBCUTANEOUS | 0 refills | Status: DC
Start: 2022-11-05 — End: 2023-02-11
  Filled 2022-11-05: qty 3, 28d supply, fill #0

## 2022-11-05 MED ORDER — SEMAGLUTIDE (1 MG/DOSE) 4 MG/3ML ~~LOC~~ SOPN
1.0000 mg | PEN_INJECTOR | SUBCUTANEOUS | 3 refills | Status: DC
Start: 2022-11-05 — End: 2023-02-11
  Filled 2022-11-05 – 2022-11-26 (×2): qty 3, 28d supply, fill #0
  Filled 2023-01-04: qty 3, 28d supply, fill #1
  Filled 2023-02-08: qty 3, 28d supply, fill #2

## 2022-11-05 NOTE — Patient Instructions (Signed)

## 2022-11-05 NOTE — Progress Notes (Signed)
Established Patient Office Visit  Subjective   Patient ID: OC LEMMEN, male    DOB: 03-06-1961  Age: 62 y.o. MRN: [id number]  No chief complaint on file.   HPI Michael Mack is a 62 year old male with a history of type 2 diabetes, hypertension, hyperlipidemia, and morbid obesity who presents for a routine follow-up and medication refill. His HgbA1c increased to 9.2% on 11/05/22.  He checks his blood glucose tid, fasting is usually in the 150-160s. He states he takes Lantus 50 units, Apidra 25 units TID and he states that he took Ozempic 0.25mg  but never refil it. He denies hypo/hyperglycemic symptoms and peripheral neuropathy. He states that he recently was in the hospital for PNA and completed Levofloxacin. He continues to follow up at [facility] for his Hepatocellular carcinoma. Overall, he states that he's doing well and offers no further complaint.    ROS Review of Systems  Constitutional: Negative.   Eyes: Negative.   Cardiovascular: Negative.   Skin: Negative.   Neurological: Negative.   Endo/Heme/Allergies: Negative.   Psychiatric/Behavioral: Negative.        Objective:     There were no vitals taken for this visit. BP Readings from Last 3 Encounters:  11/02/22 (!) 143/72  07/30/22 136/78  05/20/22 112/71   Wt Readings from Last 3 Encounters:  10/31/22 261 lb 7.5 oz (118.6 kg)  07/30/22 249 lb 3.2 oz (113 kg)  05/20/22 238 lb 9.6 oz (108.2 kg)      Physical Exam HENT:     Head: Normocephalic and atraumatic.     Mouth/Throat:     Mouth: Mucous membranes are moist.  Eyes:     Extraocular Movements: Extraocular movements intact.     Conjunctiva/sclera: Conjunctivae normal.     Pupils: Pupils are equal, round, and reactive to light.  Cardiovascular:     Rate and Rhythm: Normal rate and regular rhythm.     Pulses: Normal pulses.     Heart sounds: Normal heart sounds.  Pulmonary:     Effort: Pulmonary effort is normal.     Breath sounds: Normal breath sounds.   Skin:    General: Skin is warm.  Neurological:     General: No focal deficit present.     Mental Status: He is alert and oriented to person, place, and time. Mental status is at baseline.  Psychiatric:        Mood and Affect: Mood normal.        Behavior: Behavior normal.        Thought Content: Thought content normal.        Judgment: Judgment normal.   No results found for any visits on 11/05/22.  Last CBC Lab Results  Component Value Date   WBC 2.5 (L) 11/02/2022   HGB 11.9 (L) 11/02/2022   HCT 34.3 (L) 11/02/2022   MCV 91.0 11/02/2022   MCH 31.6 11/02/2022   RDW 13.4 11/02/2022   PLT 57 (L) 11/02/2022   Last metabolic panel Lab Results  Component Value Date   GLUCOSE 215 (H) 11/02/2022   NA 138 11/02/2022   K 3.6 11/02/2022   CL 106 11/02/2022   CO2 26 11/02/2022   BUN 13 11/02/2022   CREATININE 0.69 11/02/2022   GFRNONAA >60 11/02/2022   CALCIUM 8.2 (L) 11/02/2022   PHOS 4.0 07/25/2021   PROT 5.7 (L) 11/01/2022   ALBUMIN 2.7 (L) 11/01/2022   LABGLOB 3.0 12/24/2021   AGRATIO 1.4 12/24/2021   BILITOT 0.9 11/01/2022  ALKPHOS 55 11/01/2022   AST 30 11/01/2022   ALT 19 11/01/2022   ANIONGAP 6 11/02/2022   Last lipids Lab Results  Component Value Date   CHOL 181 12/24/2021   HDL 50 12/24/2021   LDLCALC 112 (H) 12/24/2021   TRIG 104 12/24/2021   CHOLHDL 3.6 12/24/2021   Last hemoglobin A1c Lab Results  Component Value Date   HGBA1C 8.9 (H) 07/23/2022  Last thyroid functions Lab Results  Component Value Date   TSH 2.620 12/24/2021      The ASCVD Risk score (Arnett DK, et al., 2019) failed to calculate for the following reasons:   The patient has a prior MI or stroke diagnosis    Assessment & Plan:     Type 2 diabetes mellitus without complication, with long-term current use of insulin (HCC) -His HgbA1c increased from 8.9% in Feb 24, to 9.2% on 11/05/22.He will restart the Ozempic at 0.5mg  weekly x4 weeks and then increase to 1mg  weekly.  Decrease Lantus to 42 units  and Apidra 20 units TID. Continue low carb/non concentrated sweet diet and exercise as tolerated. He will follow up with [facility] in August 24  F/u on 01/28/23 endo night.   Regino Bellow, NP

## 2022-11-06 ENCOUNTER — Other Ambulatory Visit: Payer: Self-pay

## 2022-11-11 ENCOUNTER — Other Ambulatory Visit: Payer: Self-pay

## 2022-11-26 ENCOUNTER — Other Ambulatory Visit: Payer: Self-pay

## 2022-11-27 ENCOUNTER — Other Ambulatory Visit: Payer: Self-pay

## 2022-11-30 ENCOUNTER — Other Ambulatory Visit: Payer: Self-pay

## 2022-12-07 ENCOUNTER — Other Ambulatory Visit: Payer: Self-pay

## 2022-12-07 MED ORDER — OZEMPIC (1 MG/DOSE) 4 MG/3ML ~~LOC~~ SOPN
1.0000 mg | PEN_INJECTOR | SUBCUTANEOUS | 3 refills | Status: DC
  Filled 2022-12-29 – 2023-02-10 (×2): qty 12, 112d supply, fill #0

## 2022-12-16 ENCOUNTER — Other Ambulatory Visit: Payer: Self-pay

## 2022-12-16 ENCOUNTER — Other Ambulatory Visit: Payer: Self-pay | Admitting: Gerontology

## 2022-12-16 DIAGNOSIS — E1159 Type 2 diabetes mellitus with other circulatory complications: Secondary | ICD-10-CM

## 2022-12-16 MED ORDER — LISINOPRIL 40 MG PO TABS
40.0000 mg | ORAL_TABLET | Freq: Every day | ORAL | 1 refills | Status: DC
Start: 2022-12-16 — End: 2023-03-15
  Filled 2022-12-16: qty 90, 90d supply, fill #0

## 2022-12-16 MED ORDER — HYDROCHLOROTHIAZIDE 25 MG PO TABS
25.0000 mg | ORAL_TABLET | Freq: Every day | ORAL | 1 refills | Status: AC
Start: 2022-12-16 — End: ?
  Filled 2022-12-16: qty 90, 90d supply, fill #0

## 2022-12-17 ENCOUNTER — Other Ambulatory Visit: Payer: Self-pay

## 2022-12-29 ENCOUNTER — Other Ambulatory Visit: Payer: Self-pay

## 2022-12-30 ENCOUNTER — Other Ambulatory Visit: Payer: Self-pay

## 2022-12-31 ENCOUNTER — Other Ambulatory Visit: Payer: Self-pay

## 2022-12-31 NOTE — Telephone Encounter (Signed)
Pt called in to check appt day and time.

## 2023-01-01 ENCOUNTER — Other Ambulatory Visit: Payer: Self-pay

## 2023-01-04 ENCOUNTER — Other Ambulatory Visit: Payer: Self-pay

## 2023-01-13 ENCOUNTER — Other Ambulatory Visit: Payer: Self-pay

## 2023-01-22 ENCOUNTER — Other Ambulatory Visit: Payer: Self-pay

## 2023-01-25 ENCOUNTER — Other Ambulatory Visit: Payer: Self-pay

## 2023-01-28 ENCOUNTER — Ambulatory Visit: Payer: Medicaid Other

## 2023-02-03 ENCOUNTER — Other Ambulatory Visit: Payer: Self-pay

## 2023-02-08 ENCOUNTER — Other Ambulatory Visit: Payer: Self-pay

## 2023-02-09 ENCOUNTER — Other Ambulatory Visit: Payer: Self-pay

## 2023-02-10 ENCOUNTER — Other Ambulatory Visit: Payer: Self-pay

## 2023-02-11 ENCOUNTER — Ambulatory Visit: Payer: Medicaid Other | Admitting: Gerontology

## 2023-02-11 VITALS — BP 105/69 | HR 87 | Temp 98.2°F | Ht 66.54 in | Wt 241.0 lb

## 2023-02-11 DIAGNOSIS — E1169 Type 2 diabetes mellitus with other specified complication: Secondary | ICD-10-CM

## 2023-02-11 DIAGNOSIS — I10 Essential (primary) hypertension: Secondary | ICD-10-CM

## 2023-02-11 DIAGNOSIS — Z794 Long term (current) use of insulin: Secondary | ICD-10-CM

## 2023-02-11 LAB — GLUCOSE, POCT (MANUAL RESULT ENTRY): POC Glucose: 88 mg/dl (ref 70–99)

## 2023-02-11 LAB — POCT GLYCOSYLATED HEMOGLOBIN (HGB A1C): Hemoglobin A1C: 7.1 % — AB (ref 4.0–5.6)

## 2023-02-11 MED ORDER — SEMAGLUTIDE (1 MG/DOSE) 4 MG/3ML ~~LOC~~ SOPN
1.0000 mg | PEN_INJECTOR | SUBCUTANEOUS | 1 refills | Status: AC
Start: 2023-02-11 — End: ?
  Filled 2023-02-11: qty 3, 28d supply, fill #0

## 2023-02-11 MED ORDER — APIDRA SOLOSTAR 100 UNIT/ML ~~LOC~~ SOPN
24.0000 [IU] | PEN_INJECTOR | Freq: Three times a day (TID) | SUBCUTANEOUS | 0 refills | Status: AC
  Filled 2023-02-11 – 2023-04-22 (×2): qty 66, 92d supply, fill #0

## 2023-02-11 MED ORDER — SIMVASTATIN 10 MG PO TABS
10.0000 mg | ORAL_TABLET | Freq: Every day | ORAL | 0 refills | Status: DC
Start: 2023-02-11 — End: 2023-03-18
  Filled 2023-02-11: qty 30, 30d supply, fill #0

## 2023-02-11 MED ORDER — NITROGLYCERIN 0.4 MG SL SUBL
0.4000 mg | SUBLINGUAL_TABLET | SUBLINGUAL | 3 refills | Status: AC | PRN
Start: 2023-02-11 — End: 2023-02-14
  Filled 2023-02-11: qty 25, 8d supply, fill #0
  Filled 2023-03-15: qty 25, 1d supply, fill #0
  Filled 2023-03-15: qty 25, 5d supply, fill #0

## 2023-02-11 NOTE — Progress Notes (Signed)
Follow up Diabetes/ Endocrine [facility]     Patient ID: Michael Mack, male   DOB: [birth year], 62 y.o.   MRN: [id number] Assessment:  Michael Mack is a 62 y.o. male who is seen in follow up for Type 2 diabetes mellitus with hyperlipidemia (HCC) [E11.69, E78.5] at the request of Patient, No Pcp Per.  Michael Mack is a 62 year old male with a history of type 2 diabetes, hypertension, hyperlipidemia, and morbid obesity who presents for a routine follow-up and medication refill. His HgbA1c decreased to 7.1% today from 9.2% on 11/05/22.  He checks his blood glucose tid, with AM reads in the 110-120, midday 160's, and PM 130's. The lowest he has seen recently has been around 90, and the highest 180-190. However his measurement in clinic was 88. He reports no symptoms of hypo or hyperglycemia recently, has never experienced any symptoms of peripheral neuropathy and does regular foot exams at home.  He states he takes Lantus 42 units at night, Apidra 28 units TID and he is up to 1mg  of Ozempic (up to 1mg  on 8/16) which he injects weekly in his left bicep. He is also on 40mg  of lisinopril for HTN. He says he has had no side effects from his medications, no issues with medication access, and never misses any doses of his medication.    Encounter Diagnoses 1. Type 2 diabetes mellitus with hyperlipidemia (HCC)   2. Type 2 diabetes mellitus without complication, with long-term current use of insulin (HCC)   3. Primary hypertension     Assessment  Michael Mack has made great improvements with his diabetes management with his decrease in A1c to 7.1% today. However he is still not at goal with management. Could consider titrating up his Ozempic, and decreasing his insulin, to help promote weight loss. He is also not currently on a statin and should be on one for his diabetes. He is also due for lipid panel (last done in 2019 and urine microalbumin (last done in October 2023). He is up to date on preventative  screenings.  Of note also, he has Medicaid and will need to establish care with a PCP outside of [facility].  Plan:     Stay on Ozempic 1mg  for 1 month. (Started 1mg  on 8/16). Then increase to 2mg . Let clinic know if any negative GI side efffects. Decrease Apidra (short acting insulin) to 24 units tid. After increasing Ozempic to 2mg , decrease Apidra to 20 units tid, let clinic now if BG is ever under 100. Start Statin (10mg ) and get lipid panel. Patient has Medicaid now, no more follow-up needed with [facility]. He will have one more visit with [facility] in 4 weeks to make sure he is transitioning care.    Patient Instructions  High Cholesterol  High cholesterol is a condition in which the blood has high levels of a white, waxy substance similar to fat (cholesterol). The liver makes all the cholesterol that the body needs. The human body needs small amounts of cholesterol to help build cells. A person gets extra or excess cholesterol from the food that he or she eats. The blood carries cholesterol from the liver to the rest of the body. If you have high cholesterol, deposits (plaques) may build up on the walls of your arteries. Arteries are the blood vessels that carry blood away from your heart. These plaques make the arteries narrow and stiff. Cholesterol plaques increase your risk for heart attack and stroke. Work with your health  care provider to keep your cholesterol levels in a healthy range. What increases the risk? The following factors may make you more likely to develop this condition: Eating foods that are high in animal fat (saturated fat) or cholesterol. Being overweight. Not getting enough exercise. A family history of high cholesterol (familial hypercholesterolemia). Use of tobacco products. Having diabetes. What are the signs or symptoms? In most cases, high cholesterol does not usually cause any symptoms. In severe cases, very high cholesterol levels can cause: Fatty bumps under the  skin (xanthomas). A white or gray ring around the black center (pupil) of the eye. How is this diagnosed? This condition may be diagnosed based on the results of a blood test. If you are older than 62 years of age, your health care provider may check your cholesterol levels every 4-6 years. You may be checked more often if you have high cholesterol or other risk factors for heart disease. The blood test for cholesterol measures: "Bad" cholesterol, or LDL cholesterol. This is the main type of cholesterol that causes heart disease. The desired level is less than 100 mg/dL (7.82 mmol/L). "Good" cholesterol, or HDL cholesterol. HDL helps protect against heart disease by cleaning the arteries and carrying the LDL to the liver for processing. The desired level for HDL is 60 mg/dL (9.56 mmol/L) or higher. Triglycerides. These are fats that your body can store or burn for energy. The desired level is less than 150 mg/dL (2.13 mmol/L). Total cholesterol. This measures the total amount of cholesterol in your blood and includes LDL, HDL, and triglycerides. The desired level is less than 200 mg/dL (0.86 mmol/L). How is this treated? Treatment for high cholesterol starts with lifestyle changes, such as diet and exercise. Diet changes. You may be asked to eat foods that have more fiber and less saturated fats or added sugar. Lifestyle changes. These may include regular exercise, maintaining a healthy weight, and quitting use of tobacco products. Medicines. These are given when diet and lifestyle changes have not worked. You may be prescribed a statin medicine to help lower your cholesterol levels. Follow these instructions at home: Eating and drinking  Eat a healthy, balanced diet. This diet includes: Daily servings of a variety of fresh, frozen, or canned fruits and vegetables. Daily servings of whole grain foods that are rich in fiber. Foods that are low in saturated fats and trans fats. These include  poultry and fish without skin, lean cuts of meat, and low-fat dairy products. A variety of fish, especially oily fish that contain omega-3 fatty acids. Aim to eat fish at least 2 times a week. Avoid foods and drinks that have added sugar. Use healthy cooking methods, such as roasting, grilling, broiling, baking, poaching, steaming, and stir-frying. Do not fry your food except for stir-frying. If you drink alcohol: Limit how much you have to: 0-1 drink a day for women who are not pregnant. 0-2 drinks a day for men. Know how much alcohol is in a drink. In the [place] one drink equals one 12 oz bottle of beer (355 mL), one 5 oz glass of wine (148 mL), or one 1 oz glass of hard liquor (44 mL). Lifestyle  Get regular exercise. Aim to exercise for a total of 150 minutes a week. Increase your activity level by doing activities such as gardening, walking, and taking the stairs. Do not use any products that contain nicotine or tobacco. These products include cigarettes, chewing tobacco, and vaping devices, such as e-cigarettes. If you need  help quitting, ask your health care provider. General instructions Take over-the-counter and prescription medicines only as told by your health care provider. Keep all follow-up visits. This is important. Where to find more information American Heart Association: www.heart.org National Heart, Lung, and Blood Institute: PopSteam.is Contact a health care provider if: You have trouble achieving or maintaining a healthy diet or weight. You are starting an exercise program. You are unable to stop smoking. Get help right away if: You have chest pain. You have trouble breathing. You have discomfort or pain in your jaw, neck, back, shoulder, or arm. You have any symptoms of a stroke. "BE FAST" is an easy way to remember the main warning signs of a stroke: B - Balance. Signs are dizziness, sudden trouble walking, or loss of balance. E - Eyes. Signs are trouble  seeing or a sudden change in vision. F - Face. Signs are sudden weakness or numbness of the face, or the face or eyelid drooping on one side. A - Arms. Signs are weakness or numbness in an arm. This happens suddenly and usually on one side of the body. S - Speech. Signs are sudden trouble speaking, slurred speech, or trouble understanding what people say. T - Time. Time to call emergency services. Write down what time symptoms started. You have other signs of a stroke, such as: A sudden, severe headache with no known cause. Nausea or vomiting. Seizure. These symptoms may represent a serious problem that is an emergency. Do not wait to see if the symptoms will go away. Get medical help right away. Call your local emergency services (911 in the [place] Do not drive yourself to the hospital. Summary Cholesterol plaques increase your risk for heart attack and stroke. Work with your health care provider to keep your cholesterol levels in a healthy range. Eat a healthy, balanced diet, get regular exercise, and maintain a healthy weight. Do not use any products that contain nicotine or tobacco. These products include cigarettes, chewing tobacco, and vaping devices, such as e-cigarettes. Get help right away if you have any symptoms of a stroke. This information is not intended to replace advice given to you by your health care provider. Make sure you discuss any questions you have with your health care provider. Document Revised: 01/09/2022 Document Reviewed: 08/12/2020 Elsevier Patient Education  2024 Elsevier Inc. Carbohydrate Counting for Diabetes Mellitus, Adult Carbohydrate counting is a method of keeping track of how many carbohydrates you eat. Eating carbohydrates increases the amount of sugar (glucose) in the blood. Counting how many carbohydrates you eat improves how well you manage your blood glucose. This, in turn, helps you manage your diabetes. Carbohydrates are measured in grams (g) per  serving. It is important to know how many carbohydrates (in grams or by serving size) you can have in each meal. This is different for every person. A dietitian can help you make a meal plan and calculate how many carbohydrates you should have at each meal and snack. What foods contain carbohydrates? Carbohydrates are found in the following foods: Grains, such as breads and cereals. Dried beans and soy products. Starchy vegetables, such as potatoes, peas, and corn. Fruit and fruit juices. Milk and yogurt. Sweets and snack foods, such as cake, cookies, candy, chips, and soft drinks. How do I count carbohydrates in foods? There are two ways to count carbohydrates in food. You can read food labels or learn standard serving sizes of foods. You can use either of these methods or a combination of  both. Using the Nutrition Facts label The Nutrition Facts list is included on the labels of almost all packaged foods and beverages in the [place]. It includes: The serving size. Information about nutrients in each serving, including the grams of carbohydrate per serving. To use the Nutrition Facts, decide how many servings you will have. Then, multiply the number of servings by the number of carbohydrates per serving. The resulting number is the total grams of carbohydrates that you will be having. Learning the standard serving sizes of foods When you eat carbohydrate foods that are not packaged or do not include Nutrition Facts on the label, you need to measure the servings in order to count the grams of carbohydrates. Measure the foods that you will eat with a food scale or measuring cup, if needed. Decide how many standard-size servings you will eat. Multiply the number of servings by 15. For foods that contain carbohydrates, one serving equals 15 g of carbohydrates. For example, if you eat 2 cups or 10 oz (300 g) of strawberries, you will have eaten 2 servings and 30 g of carbohydrates (2 servings  x 15 g = 30 g). For foods that have more than one food mixed, such as soups and casseroles, you must count the carbohydrates in each food that is included. The following list contains standard serving sizes of common carbohydrate-rich foods. Each of these servings has about 15 g of carbohydrates: 1 slice of bread. 1 six-inch (15 cm) tortilla. ? cup or 2 oz (53 g) cooked rice or pasta.  cup or 3 oz (85 g) cooked or canned, drained and rinsed beans or lentils.  cup or 3 oz (85 g) starchy vegetable, such as peas, corn, or squash.  cup or 4 oz (120 g) hot cereal.  cup or 3 oz (85 g) boiled or mashed potatoes, or  or 3 oz (85 g) of a large baked potato.  cup or 4 fl oz (118 mL) fruit juice. 1 cup or 8 fl oz (237 mL) milk. 1 small or 4 oz (106 g) apple.  or 2 oz (63 g) of a medium banana. 1 cup or 5 oz (150 g) strawberries. 3 cups or 1 oz (28.3 g) popped popcorn. What is an example of carbohydrate counting? To calculate the grams of carbohydrates in this sample meal, follow the steps shown below. Sample meal 3 oz (85 g) chicken breast. ? cup or 4 oz (106 g) brown rice.  cup or 3 oz (85 g) corn. 1 cup or 8 fl oz (237 mL) milk. 1 cup or 5 oz (150 g) strawberries with sugar-free whipped topping. Carbohydrate calculation Identify the foods that contain carbohydrates: Rice. Corn. Milk. Strawberries. Calculate how many servings you have of each food: 2 servings rice. 1 serving corn. 1 serving milk. 1 serving strawberries. Multiply each number of servings by 15 g: 2 servings rice x 15 g = 30 g. 1 serving corn x 15 g = 15 g. 1 serving milk x 15 g = 15 g. 1 serving strawberries x 15 g = 15 g. Add together all of the amounts to find the total grams of carbohydrates eaten: 30 g + 15 g + 15 g + 15 g = 75 g of carbohydrates total. What are tips for following this plan? Shopping Develop a meal plan and then make a shopping list. Buy fresh and frozen vegetables, fresh and frozen  fruit, dairy, eggs, beans, lentils, and whole grains. Look at food labels. Choose foods that  have more fiber and less sugar. Avoid processed foods and foods with added sugars. Meal planning Aim to have the same number of grams of carbohydrates at each meal and for each snack time. Plan to have regular, balanced meals and snacks. Where to find more information American Diabetes Association: diabetes.org Centers for Disease Control and Prevention: TonerPromos.no Academy of Nutrition and Dietetics: eatright.org Association of Diabetes Care & Education Specialists: diabeteseducator.org Summary Carbohydrate counting is a method of keeping track of how many carbohydrates you eat. Eating carbohydrates increases the amount of sugar (glucose) in your blood. Counting how many carbohydrates you eat improves how well you manage your blood glucose. This helps you manage your diabetes. A dietitian can help you make a meal plan and calculate how many carbohydrates you should have at each meal and snack. This information is not intended to replace advice given to you by your health care provider. Make sure you discuss any questions you have with your health care provider. Document Revised: 01/10/2020 Document Reviewed: 01/10/2020 Elsevier Patient Education  2024 Elsevier Inc.    Orders Placed This Encounter  Procedures   POCT HgB A1C   POCT Glucose (CBG)     Subjective:  HPI  Michael Mack is a 62 year old male with a history of type 2 diabetes, hypertension, hyperlipidemia, and morbid obesity who presents for a routine follow-up and medication refill. His HgbA1c decreased to 7.1% today from 9.2% on 11/05/22.  He checks his blood glucose tid, with AM reads in the 110-120, midday 160's, and PM 130's. The lowest he has seen recently has been around 90, and the highest 180-190. However his measurement in clinic was 88. He reports no symptoms of hypo or hyperglycemia recently, has never experienced any symptoms of  peripheral neuropathy and does regular foot exams at home.  He states he takes Lantus 42 units at night, Apidra 28 units TID and he is up to 1mg  of Ozempic (up to 1mg  on 8/16) which he injects weekly in his left bicep. He is also on 40mg  of lisinopril for HTN. He says he has had no side effects from his medications, no issues with medication access, and never misses any doses of his medication.   As far as lifestyle goes, he typically has cereal/oatmeal for breakfast, sandwichs for lunch, and protein + vegetables for dinner. He is pretty active in his daily life with doing jobs for work and keeping up his house (yardwork, etc.). He denies any recent smoking, alcohol use, or recreational drug use.  He is up to take on his preventative care, with an eye exam in the past year and foot exam in the past 3 months.  Review of Systems No pertinent positives.  Michael Mack  has a past medical history of Bulging lumbar disc, Chronic pain, Diabetes mellitus without complication (HCC), GERD (gastroesophageal reflux disease), Hepatic encephalopathy (HCC), Hepatitis C, Hepatocellular carcinoma (HCC), Hypertension, Migraines, Morbid obesity (HCC), and Thrombocytopenia (HCC).  Family History, Social History, current Medications and allergies reviewed and updated in Epic.  Objective:    Blood pressure 105/69, pulse 87, temperature 98.2 F (36.8 C), height 5' 6.54" (1.69 m), weight 241 lb (109.3 kg), SpO2 94%. Physical Exam CVD: Regular Rate and Rhythm LUNG: CTAB DERM: Minor bruising at Ozempic injection site on left bicep     Data : I have personally reviewed pertinent labs and imaging studies, if indicated,  with the patient in clinic today.    Lab Orders  POCT HgB A1C         POCT Glucose (CBG)     HC Readings from Last 3 Encounters:  No data found for [facility]    Wt Readings from Last 3 Encounters:  02/11/23 241 lb (109.3 kg)  11/05/22 241 lb 1.6 oz (109.4 kg)  10/31/22 261 lb 7.5 oz  (118.6 kg)    Deetta Siegmann X. Dierdre Searles, Medical Student

## 2023-02-11 NOTE — Patient Instructions (Signed)
High Cholesterol  High cholesterol is a condition in which the blood has high levels of a white, waxy substance similar to fat (cholesterol). The liver makes all the cholesterol that the body needs. The human body needs small amounts of cholesterol to help build cells. A person gets extra or excess cholesterol from the food that he or she eats. The blood carries cholesterol from the liver to the rest of the body. If you have high cholesterol, deposits (plaques) may build up on the walls of your arteries. Arteries are the blood vessels that carry blood away from your heart. These plaques make the arteries narrow and stiff. Cholesterol plaques increase your risk for heart attack and stroke. Work with your health care provider to keep your cholesterol levels in a healthy range. What increases the risk? The following factors may make you more likely to develop this condition: Eating foods that are high in animal fat (saturated fat) or cholesterol. Being overweight. Not getting enough exercise. A family history of high cholesterol (familial hypercholesterolemia). Use of tobacco products. Having diabetes. What are the signs or symptoms? In most cases, high cholesterol does not usually cause any symptoms. In severe cases, very high cholesterol levels can cause: Fatty bumps under the skin (xanthomas). A white or gray ring around the black center (pupil) of the eye. How is this diagnosed? This condition may be diagnosed based on the results of a blood test. If you are older than 62 years of age, your health care provider may check your cholesterol levels every 4-6 years. You may be checked more often if you have high cholesterol or other risk factors for heart disease. The blood test for cholesterol measures: "Bad" cholesterol, or LDL cholesterol. This is the main type of cholesterol that causes heart disease. The desired level is less than 100 mg/dL (1.61 mmol/L). "Good" cholesterol, or HDL  cholesterol. HDL helps protect against heart disease by cleaning the arteries and carrying the LDL to the liver for processing. The desired level for HDL is 60 mg/dL (0.96 mmol/L) or higher. Triglycerides. These are fats that your body can store or burn for energy. The desired level is less than 150 mg/dL (0.45 mmol/L). Total cholesterol. This measures the total amount of cholesterol in your blood and includes LDL, HDL, and triglycerides. The desired level is less than 200 mg/dL (4.09 mmol/L). How is this treated? Treatment for high cholesterol starts with lifestyle changes, such as diet and exercise. Diet changes. You may be asked to eat foods that have more fiber and less saturated fats or added sugar. Lifestyle changes. These may include regular exercise, maintaining a healthy weight, and quitting use of tobacco products. Medicines. These are given when diet and lifestyle changes have not worked. You may be prescribed a statin medicine to help lower your cholesterol levels. Follow these instructions at home: Eating and drinking  Eat a healthy, balanced diet. This diet includes: Daily servings of a variety of fresh, frozen, or canned fruits and vegetables. Daily servings of whole grain foods that are rich in fiber. Foods that are low in saturated fats and trans fats. These include poultry and fish without skin, lean cuts of meat, and low-fat dairy products. A variety of fish, especially oily fish that contain omega-3 fatty acids. Aim to eat fish at least 2 times a week. Avoid foods and drinks that have added sugar. Use healthy cooking methods, such as roasting, grilling, broiling, baking, poaching, steaming, and stir-frying. Do not fry your food except for  stir-frying. If you drink alcohol: Limit how much you have to: 0-1 drink a day for women who are not pregnant. 0-2 drinks a day for men. Know how much alcohol is in a drink. In the [place] one drink equals one 12 oz bottle of beer (355 mL),  one 5 oz glass of wine (148 mL), or one 1 oz glass of hard liquor (44 mL). Lifestyle  Get regular exercise. Aim to exercise for a total of 150 minutes a week. Increase your activity level by doing activities such as gardening, walking, and taking the stairs. Do not use any products that contain nicotine or tobacco. These products include cigarettes, chewing tobacco, and vaping devices, such as e-cigarettes. If you need help quitting, ask your health care provider. General instructions Take over-the-counter and prescription medicines only as told by your health care provider. Keep all follow-up visits. This is important. Where to find more information American Heart Association: www.heart.org National Heart, Lung, and Blood Institute: PopSteam.is Contact a health care provider if: You have trouble achieving or maintaining a healthy diet or weight. You are starting an exercise program. You are unable to stop smoking. Get help right away if: You have chest pain. You have trouble breathing. You have discomfort or pain in your jaw, neck, back, shoulder, or arm. You have any symptoms of a stroke. "BE FAST" is an easy way to remember the main warning signs of a stroke: B - Balance. Signs are dizziness, sudden trouble walking, or loss of balance. E - Eyes. Signs are trouble seeing or a sudden change in vision. F - Face. Signs are sudden weakness or numbness of the face, or the face or eyelid drooping on one side. A - Arms. Signs are weakness or numbness in an arm. This happens suddenly and usually on one side of the body. S - Speech. Signs are sudden trouble speaking, slurred speech, or trouble understanding what people say. T - Time. Time to call emergency services. Write down what time symptoms started. You have other signs of a stroke, such as: A sudden, severe headache with no known cause. Nausea or vomiting. Seizure. These symptoms may represent a serious problem that is an  emergency. Do not wait to see if the symptoms will go away. Get medical help right away. Call your local emergency services (911 in the [place] Do not drive yourself to the hospital. Summary Cholesterol plaques increase your risk for heart attack and stroke. Work with your health care provider to keep your cholesterol levels in a healthy range. Eat a healthy, balanced diet, get regular exercise, and maintain a healthy weight. Do not use any products that contain nicotine or tobacco. These products include cigarettes, chewing tobacco, and vaping devices, such as e-cigarettes. Get help right away if you have any symptoms of a stroke. This information is not intended to replace advice given to you by your health care provider. Make sure you discuss any questions you have with your health care provider. Document Revised: 01/09/2022 Document Reviewed: 08/12/2020 Elsevier Patient Education  2024 [employer] Carbohydrate Counting for Diabetes Mellitus, Adult Carbohydrate counting is a method of keeping track of how many carbohydrates you eat. Eating carbohydrates increases the amount of sugar (glucose) in the blood. Counting how many carbohydrates you eat improves how well you manage your blood glucose. This, in turn, helps you manage your diabetes. Carbohydrates are measured in grams (g) per serving. It is important to know how many carbohydrates (in grams or by serving size)  you can have in each meal. This is different for every person. A dietitian can help you make a meal plan and calculate how many carbohydrates you should have at each meal and snack. What foods contain carbohydrates? Carbohydrates are found in the following foods: Grains, such as breads and cereals. Dried beans and soy products. Starchy vegetables, such as potatoes, peas, and corn. Fruit and fruit juices. Milk and yogurt. Sweets and snack foods, such as cake, cookies, candy, chips, and soft drinks. How do I count carbohydrates in  foods? There are two ways to count carbohydrates in food. You can read food labels or learn standard serving sizes of foods. You can use either of these methods or a combination of both. Using the Nutrition Facts label The Nutrition Facts list is included on the labels of almost all packaged foods and beverages in the [place]. It includes: The serving size. Information about nutrients in each serving, including the grams of carbohydrate per serving. To use the Nutrition Facts, decide how many servings you will have. Then, multiply the number of servings by the number of carbohydrates per serving. The resulting number is the total grams of carbohydrates that you will be having. Learning the standard serving sizes of foods When you eat carbohydrate foods that are not packaged or do not include Nutrition Facts on the label, you need to measure the servings in order to count the grams of carbohydrates. Measure the foods that you will eat with a food scale or measuring cup, if needed. Decide how many standard-size servings you will eat. Multiply the number of servings by 15. For foods that contain carbohydrates, one serving equals 15 g of carbohydrates. For example, if you eat 2 cups or 10 oz (300 g) of strawberries, you will have eaten 2 servings and 30 g of carbohydrates (2 servings x 15 g = 30 g). For foods that have more than one food mixed, such as soups and casseroles, you must count the carbohydrates in each food that is included. The following list contains standard serving sizes of common carbohydrate-rich foods. Each of these servings has about 15 g of carbohydrates: 1 slice of bread. 1 six-inch (15 cm) tortilla. ? cup or 2 oz (53 g) cooked rice or pasta.  cup or 3 oz (85 g) cooked or canned, drained and rinsed beans or lentils.  cup or 3 oz (85 g) starchy vegetable, such as peas, corn, or squash.  cup or 4 oz (120 g) hot cereal.  cup or 3 oz (85 g) boiled or mashed potatoes, or   or 3 oz (85 g) of a large baked potato.  cup or 4 fl oz (118 mL) fruit juice. 1 cup or 8 fl oz (237 mL) milk. 1 small or 4 oz (106 g) apple.  or 2 oz (63 g) of a medium banana. 1 cup or 5 oz (150 g) strawberries. 3 cups or 1 oz (28.3 g) popped popcorn. What is an example of carbohydrate counting? To calculate the grams of carbohydrates in this sample meal, follow the steps shown below. Sample meal 3 oz (85 g) chicken breast. ? cup or 4 oz (106 g) brown rice.  cup or 3 oz (85 g) corn. 1 cup or 8 fl oz (237 mL) milk. 1 cup or 5 oz (150 g) strawberries with sugar-free whipped topping. Carbohydrate calculation Identify the foods that contain carbohydrates: Rice. Corn. Milk. Strawberries. Calculate how many servings you have of each food: 2 servings rice. 1 serving  corn. 1 serving milk. 1 serving strawberries. Multiply each number of servings by 15 g: 2 servings rice x 15 g = 30 g. 1 serving corn x 15 g = 15 g. 1 serving milk x 15 g = 15 g. 1 serving strawberries x 15 g = 15 g. Add together all of the amounts to find the total grams of carbohydrates eaten: 30 g + 15 g + 15 g + 15 g = 75 g of carbohydrates total. What are tips for following this plan? Shopping Develop a meal plan and then make a shopping list. Buy fresh and frozen vegetables, fresh and frozen fruit, dairy, eggs, beans, lentils, and whole grains. Look at food labels. Choose foods that have more fiber and less sugar. Avoid processed foods and foods with added sugars. Meal planning Aim to have the same number of grams of carbohydrates at each meal and for each snack time. Plan to have regular, balanced meals and snacks. Where to find more information American Diabetes Association: diabetes.org Centers for Disease Control and Prevention: TonerPromos.no Academy of Nutrition and Dietetics: eatright.org Association of Diabetes Care & Education Specialists: diabeteseducator.org Summary Carbohydrate counting is a method  of keeping track of how many carbohydrates you eat. Eating carbohydrates increases the amount of sugar (glucose) in your blood. Counting how many carbohydrates you eat improves how well you manage your blood glucose. This helps you manage your diabetes. A dietitian can help you make a meal plan and calculate how many carbohydrates you should have at each meal and snack. This information is not intended to replace advice given to you by your health care provider. Make sure you discuss any questions you have with your health care provider. Document Revised: 01/10/2020 Document Reviewed: 01/10/2020 Elsevier Patient Education  2024 [employer].

## 2023-02-12 ENCOUNTER — Other Ambulatory Visit: Payer: Self-pay

## 2023-02-21 ENCOUNTER — Other Ambulatory Visit: Payer: Self-pay

## 2023-02-24 ENCOUNTER — Other Ambulatory Visit: Payer: Self-pay

## 2023-02-24 ENCOUNTER — Other Ambulatory Visit: Payer: Medicaid Other

## 2023-02-24 DIAGNOSIS — E1169 Type 2 diabetes mellitus with other specified complication: Secondary | ICD-10-CM

## 2023-02-25 LAB — LIPID PANEL
Chol/HDL Ratio: 3.3 ratio (ref 0.0–5.0)
Cholesterol, Total: 174 mg/dL (ref 100–199)
HDL: 53 mg/dL (ref >39)
LDL Chol Calc (NIH): 100 mg/dL — ABNORMAL HIGH (ref 0–99)
Triglycerides: 119 mg/dL (ref 0–149)
VLDL Cholesterol Cal: 21 mg/dL (ref 5–40)

## 2023-02-26 ENCOUNTER — Other Ambulatory Visit: Payer: Self-pay

## 2023-03-08 ENCOUNTER — Other Ambulatory Visit: Payer: Self-pay

## 2023-03-08 MED ORDER — OZEMPIC (1 MG/DOSE) 4 MG/3ML ~~LOC~~ SOPN
1.0000 mg | PEN_INJECTOR | SUBCUTANEOUS | 3 refills | Status: AC
Start: 2023-03-08 — End: ?
  Filled 2023-03-08 – 2023-03-18 (×2): qty 3, 28d supply, fill #0

## 2023-03-08 MED ORDER — LACTULOSE 10 GM/15ML PO SOLN
20.0000 g | Freq: Three times a day (TID) | ORAL | 11 refills | Status: AC
  Filled 2023-03-08 – 2023-03-17 (×2): qty 2365, 26d supply, fill #0
  Filled 2023-04-22: qty 2365, 26d supply, fill #1

## 2023-03-08 MED ORDER — APIDRA 100 UNIT/ML IJ SOLN
30.0000 [IU] | Freq: Three times a day (TID) | INTRAMUSCULAR | 0 refills | Status: AC
  Filled 2023-03-08: qty 10, 11d supply, fill #0

## 2023-03-08 MED ORDER — HYDROCHLOROTHIAZIDE 25 MG PO TABS
25.0000 mg | ORAL_TABLET | Freq: Every day | ORAL | 3 refills | Status: AC
  Filled 2023-03-08 – 2023-03-17 (×2): qty 90, 90d supply, fill #0

## 2023-03-08 MED ORDER — INSULIN GLARGINE SOLOSTAR 100 UNIT/ML ~~LOC~~ SOPN
50.0000 [IU] | PEN_INJECTOR | Freq: Every evening | SUBCUTANEOUS | 0 refills | Status: AC
  Filled 2023-03-08: qty 15, 30d supply, fill #0

## 2023-03-09 ENCOUNTER — Other Ambulatory Visit: Payer: Self-pay

## 2023-03-10 ENCOUNTER — Other Ambulatory Visit: Payer: Self-pay

## 2023-03-15 ENCOUNTER — Other Ambulatory Visit: Payer: Self-pay

## 2023-03-15 MED ORDER — NITROGLYCERIN 0.4 MG SL SUBL
0.4000 mg | SUBLINGUAL_TABLET | SUBLINGUAL | 5 refills | Status: AC | PRN
  Filled 2023-03-15: qty 25, 5d supply, fill #0

## 2023-03-15 MED ORDER — LISINOPRIL 40 MG PO TABS
40.0000 mg | ORAL_TABLET | Freq: Every day | ORAL | 1 refills | Status: AC
Start: 2023-03-15 — End: ?
  Filled 2023-03-15: qty 90, 90d supply, fill #0

## 2023-03-16 ENCOUNTER — Other Ambulatory Visit: Payer: Self-pay

## 2023-03-17 ENCOUNTER — Other Ambulatory Visit: Payer: Self-pay

## 2023-03-18 ENCOUNTER — Ambulatory Visit: Payer: Medicaid Other | Admitting: Gerontology

## 2023-03-18 ENCOUNTER — Other Ambulatory Visit: Payer: Self-pay

## 2023-03-18 VITALS — BP 127/75 | HR 78 | Temp 98.6°F | Ht 66.0 in | Wt 252.3 lb

## 2023-03-18 DIAGNOSIS — E1169 Type 2 diabetes mellitus with other specified complication: Secondary | ICD-10-CM

## 2023-03-18 MED ORDER — SIMVASTATIN 10 MG PO TABS
10.0000 mg | ORAL_TABLET | Freq: Every day | ORAL | 0 refills | Status: AC
  Filled 2023-03-18: qty 30, 30d supply, fill #0

## 2023-03-18 NOTE — Patient Instructions (Signed)
Dyslipidemia Dyslipidemia is an imbalance of waxy, fat-like substances (lipids) in the blood. The body needs lipids in small amounts. Dyslipidemia often involves a high level of cholesterol or triglycerides, which are types of lipids. Common forms of dyslipidemia include: High levels of LDL cholesterol. LDL is the type of cholesterol that causes fatty deposits (plaques) to build up in the blood vessels that carry blood away from the heart (arteries). Low levels of HDL cholesterol. HDL cholesterol is the type of cholesterol that protects against heart disease. High levels of HDL remove the LDL buildup from arteries. High levels of triglycerides. Triglycerides are a fatty substance in the blood that is linked to a buildup of plaques in the arteries. What are the causes? There are two main types of dyslipidemia: primary and secondary. Primary dyslipidemia is caused by changes (mutations) in genes that are passed down through families (inherited). These mutations cause several types of dyslipidemia. Secondary dyslipidemia may be caused by various risk factors that can lead to the disease, such as lifestyle choices and certain medical conditions. What increases the risk? You are more likely to develop this condition if you are an older man or if you are a woman who has gone through menopause. Other risk factors include: Having a family history of dyslipidemia. Taking certain medicines, including birth control pills, steroids, some diuretics, and beta-blockers. Eating a diet high in saturated fat. Smoking cigarettes or excessive alcohol intake. Having certain medical conditions such as diabetes, polycystic ovary syndrome (PCOS), kidney disease, liver disease, or hypothyroidism. Not exercising regularly. Being overweight or obese with too much belly fat. What are the signs or symptoms? In most cases, dyslipidemia does not usually cause any symptoms. In severe cases, very high lipid levels can  cause: Fatty bumps under the skin (xanthomas). A white or gray ring around the black center (pupil) of the eye. Very high triglyceride levels can cause inflammation of the pancreas (pancreatitis). How is this diagnosed? Your health care provider may diagnose dyslipidemia based on a routine blood test (fasting blood test). Because most people do not have symptoms of the condition, this blood testing (lipid profile) is done on adults age 20 and older and is repeated every 4-6 years. This test checks: Total cholesterol. This measures the total amount of cholesterol in your blood, including LDL cholesterol, HDL cholesterol, and triglycerides. A healthy number is below 200 mg/dL (5.17 mmol/L). LDL cholesterol. The target number for LDL cholesterol is different for each person, depending on individual risk factors. A healthy number is usually below 100 mg/dL (2.59 mmol/L). Ask your health care provider what your LDL cholesterol should be. HDL cholesterol. An HDL level of 60 mg/dL (1.55 mmol/L) or higher is best because it helps to protect against heart disease. A number below 40 mg/dL (1.03 mmol/L) for men or below 50 mg/dL (1.29 mmol/L) for women increases the risk for heart disease. Triglycerides. A healthy triglyceride number is below 150 mg/dL (1.69 mmol/L). If your lipid profile is abnormal, your health care provider may do other blood tests. How is this treated? Treatment depends on the type of dyslipidemia that you have and your other risk factors for heart disease and stroke. Your health care provider will have a target range for your lipid levels based on this information. Treatment for dyslipidemia starts with lifestyle changes, such as diet and exercise. Your health care provider may recommend that you: Get regular exercise. Make changes to your diet. Quit smoking if you smoke. Limit your alcohol intake. If diet   changes and exercise do not help you reach your goals, your health care provider  may also prescribe medicine to lower lipids. The most commonly prescribed type of medicine lowers your LDL cholesterol (statin drug). If you have a high triglyceride level, your provider may prescribe another type of drug (fibrate) or an omega-3 fish oil supplement, or both. Follow these instructions at home: Eating and drinking  Follow instructions from your health care provider or dietitian about eating or drinking restrictions. Eat a healthy diet as told by your health care provider. This can help you reach and maintain a healthy weight, lower your LDL cholesterol, and raise your HDL cholesterol. This may include: Limiting your calories, if you are overweight. Eating more fruits, vegetables, whole grains, fish, and lean meats. Limiting saturated fat, trans fat, and cholesterol. Do not drink alcohol if: Your health care provider tells you not to drink. You are pregnant, may be pregnant, or are planning to become pregnant. If you drink alcohol: Limit how much you have to: 0-1 drink a day for women. 0-2 drinks a day for men. Know how much alcohol is in your drink. In the [place] one drink equals one 12 oz bottle of beer (355 mL), one 5 oz glass of wine (148 mL), or one 1 oz glass of hard liquor (44 mL). Activity Get regular exercise. Start an exercise and strength training program as told by your health care provider. Ask your health care provider what activities are safe for you. Your health care provider may recommend: 30 minutes of aerobic activity 4-6 days a week. Brisk walking is an example of aerobic activity. Strength training 2 days a week. General instructions Do not use any products that contain nicotine or tobacco. These products include cigarettes, chewing tobacco, and vaping devices, such as e-cigarettes. If you need help quitting, ask your health care provider. Take over-the-counter and prescription medicines only as told by your health care provider. This includes  supplements. Keep all follow-up visits. This is important. Contact a health care provider if: You are having trouble sticking to your exercise or diet plan. You are struggling to quit smoking or to control your use of alcohol. Summary Dyslipidemia often involves a high level of cholesterol or triglycerides, which are types of lipids. Treatment depends on the type of dyslipidemia that you have and your other risk factors for heart disease and stroke. Treatment for dyslipidemia starts with lifestyle changes, such as diet and exercise. Your health care provider may prescribe medicine to lower lipids. This information is not intended to replace advice given to you by your health care provider. Make sure you discuss any questions you have with your health care provider. Document Revised: 01/09/2022 Document Reviewed: 08/12/2020 Elsevier Patient Education  2024 [employer]  

## 2023-03-18 NOTE — Progress Notes (Signed)
Established Patient Office Visit  Subjective   Patient ID: Michael Mack, male    DOB: July 20, 1960  Age: 62 y.o. MRN: [id number]  Chief Complaint  Patient presents with   Hyperlipidemia    HPI  ommy D Ha is a 62 y.o. male who is seen in follow up for Type 2 diabetes mellitus with hyperlipidemia (HCC) [E11.69, E78.5]  who presents for his last clinic visit and lab review. He established care with [facility] medicine. His Lipid panel checked on 02/24/23, LDL was 100 mg/dl, and was started on 10 mg Zocor. He states that he tolerates medication and denies side effects.  He states that he will increase his Ozempic to 2 mg tomorrow and decrease his Apidra to 20 units. He checks his blood glucose bid, states that his fasting readings are in the 120's while his evening readings are in the 180's. He denies hypo/hyperglycemic symptoms, peripheral neuropathy and performs daily foot checks. Overall, he states that he's doing well and offers no further complaint.    Patient Active Problem List   Diagnosis Date Noted   Sepsis (HCC) 10/31/2022   Pneumonia 10/31/2022   Acute encephalopathy 10/31/2022   Increased ammonia level 07/24/2021   Obesity, Class III, BMI 40-49.9 (morbid obesity) (HCC) 07/24/2021   Chronic, continuous use of opioids 07/24/2021   NSTEMI (non-ST elevated myocardial infarction) (HCC) 07/24/2021   Elevated carcinoembryonic antigen (CEA) 01/22/2021   Hepatocellular carcinoma (HCC) 07/03/2020   Acute kidney injury (HCC) 12/06/2019   Chronic pain 12/06/2019   Altered mental status 12/02/2019   Acute respiratory failure with hypoxia and hypercapnia (HCC)    Acute metabolic encephalopathy 07/13/2018   Thrombocytopenia (HCC) 09/11/2015   Cirrhosis (HCC) 09/11/2015   Hepatitis C 04/18/2015   Diabetes (HCC) 01/01/2015   GERD (gastroesophageal reflux disease) 01/01/2015   Hypertension 01/01/2015   Fatty liver 01/01/2015   Past Medical History:  Diagnosis Date   Bulging lumbar  disc    Chronic pain    Diabetes mellitus without complication (HCC)    GERD (gastroesophageal reflux disease)    Hepatic encephalopathy (HCC)    Hepatitis C    Hepatocellular carcinoma (HCC)    Hypertension    Migraines    Morbid obesity (HCC)    Thrombocytopenia (HCC)    Past Surgical History:  Procedure Laterality Date   KNEE SURGERY     Right Knee x 3, Left Knee x 1   RADIOFREQUENCY ABLATION LIVER TUMOR  03/2021   Lyndon reported that he had part of his liver "burned off" a year ago due to cancer   Social History   Tobacco Use   Smoking status: Never   Smokeless tobacco: Never  Vaping Use   Vaping status: Never Used  Substance Use Topics   Alcohol use: Not Currently    Comment: drank on weekends when he was younger, hasn't drank in 20 years   Drug use: No   Family History  Problem Relation Age of Onset   COPD Mother    Cancer Mother        lung   Heart disease Father    Heart attack Father 54   Heart attack Brother 77   Other Brother        brain tumor   Other Maternal Grandmother        unknown medical history   Other Maternal Grandfather        unknown medical history   Other Paternal Grandmother  unknown medical history   Other Paternal Grandfather        unknown medical history   No Known Allergies    Review of Systems  Constitutional: Negative.   Eyes: Negative.   Cardiovascular: Negative.   Skin: Negative.   Neurological: Negative.   Endo/Heme/Allergies: Negative.   Psychiatric/Behavioral: Negative.        Objective:     BP 127/75 (BP Location: Right Arm, Patient Position: Sitting, Cuff Size: Large)   Pulse 78   Temp 98.6 F (37 C)   Ht 5\' 6"  (1.676 m)   Wt 252 lb 4.8 oz (114.4 kg)   SpO2 92%   BMI 40.72 kg/m  BP Readings from Last 3 Encounters:  03/18/23 127/75  02/11/23 105/69  11/05/22 (!) 144/77   Wt Readings from Last 3 Encounters:  03/18/23 252 lb 4.8 oz (114.4 kg)  02/11/23 241 lb (109.3 kg)  11/05/22 241 lb 1.6  oz (109.4 kg)      Physical Exam HENT:     Head: Normocephalic and atraumatic.     Mouth/Throat:     Mouth: Mucous membranes are moist.  Eyes:     Pupils: Pupils are equal, round, and reactive to light.  Cardiovascular:     Rate and Rhythm: Normal rate and regular rhythm.     Pulses: Normal pulses.     Heart sounds: Normal heart sounds.  Pulmonary:     Effort: Pulmonary effort is normal.     Breath sounds: Normal breath sounds.  Skin:    General: Skin is warm.  Neurological:     General: No focal deficit present.     Mental Status: He is alert and oriented to person, place, and time.  Psychiatric:        Mood and Affect: Mood normal.        Behavior: Behavior normal.        Thought Content: Thought content normal.        Judgment: Judgment normal.      No results found for any visits on 03/18/23.  Last CBC Lab Results  Component Value Date   WBC 2.5 (L) 11/02/2022   HGB 11.9 (L) 11/02/2022   HCT 34.3 (L) 11/02/2022   MCV 91.0 11/02/2022   MCH 31.6 11/02/2022   RDW 13.4 11/02/2022   PLT 57 (L) 11/02/2022   Last metabolic panel Lab Results  Component Value Date   GLUCOSE 215 (H) 11/02/2022   NA 138 11/02/2022   K 3.6 11/02/2022   CL 106 11/02/2022   CO2 26 11/02/2022   BUN 13 11/02/2022   CREATININE 0.69 11/02/2022   GFRNONAA >60 11/02/2022   CALCIUM 8.2 (L) 11/02/2022   PHOS 4.0 07/25/2021   PROT 5.7 (L) 11/01/2022   ALBUMIN 2.7 (L) 11/01/2022   LABGLOB 3.0 12/24/2021   AGRATIO 1.4 12/24/2021   BILITOT 0.9 11/01/2022   ALKPHOS 55 11/01/2022   AST 30 11/01/2022   ALT 19 11/01/2022   ANIONGAP 6 11/02/2022   Last lipids Lab Results  Component Value Date   CHOL 174 02/24/2023   HDL 53 02/24/2023   LDLCALC 100 (H) 02/24/2023   TRIG 119 02/24/2023   CHOLHDL 3.3 02/24/2023   Last hemoglobin A1c Lab Results  Component Value Date   HGBA1C 7.1 (A) 02/11/2023   Last thyroid functions Lab Results  Component Value Date   TSH 2.620 12/24/2021    Last vitamin D No results found for: "25OHVITD2", "25OHVITD3", "VD25OH" Last vitamin B12 and Folate No  results found for: "VITAMINB12", "FOLATE"    The ASCVD Risk score (Arnett DK, et al., 2019) failed to calculate for the following reasons:   The patient has a prior MI or stroke diagnosis    Assessment & Plan:   1. Type 2 diabetes mellitus with hyperlipidemia (HCC) - He will continue on his current medication, low fat/cholesterol diet and exercise as tolerated. - simvastatin (ZOCOR) 10 MG tablet; Take 1 tablet (10 mg total) by mouth daily.  Dispense: 30 tablet; Refill: 0   No follow-ups on file. He has no follow up appointment, he has active medicaid and a PCP. [facility] wishes him wel with his care.   Eyleen Rawlinson Trellis Paganini, NP

## 2023-03-19 ENCOUNTER — Other Ambulatory Visit: Payer: Self-pay

## 2023-03-23 ENCOUNTER — Other Ambulatory Visit: Payer: Self-pay

## 2023-04-22 ENCOUNTER — Other Ambulatory Visit: Payer: Self-pay

## 2023-06-01 ENCOUNTER — Other Ambulatory Visit: Payer: Self-pay

## 2023-07-12 ENCOUNTER — Other Ambulatory Visit: Payer: Self-pay

## 2023-07-14 ENCOUNTER — Other Ambulatory Visit: Payer: Self-pay

## 2023-07-19 ENCOUNTER — Other Ambulatory Visit: Payer: Self-pay

## 2023-08-06 ENCOUNTER — Other Ambulatory Visit: Payer: Self-pay

## 2023-11-29 ENCOUNTER — Other Ambulatory Visit: Payer: Self-pay

## 2023-11-29 IMAGING — CT CT ANGIO CHEST
2 of 7 series · 16 of 36 positions shown · IV contrast (APPLIED)
Comparison: :
COMPARISON: Chest x-ray from earlier in the same day, CT from 11/12/2009.

CLINICAL DATA: Found face down in pond with shortness of breath,
initial encounter



[Series 6: thins · axial · 0.79mm/px · z∈[-530,-258]mm · 15 of 312 slices shown]
[im 20/312  lung]
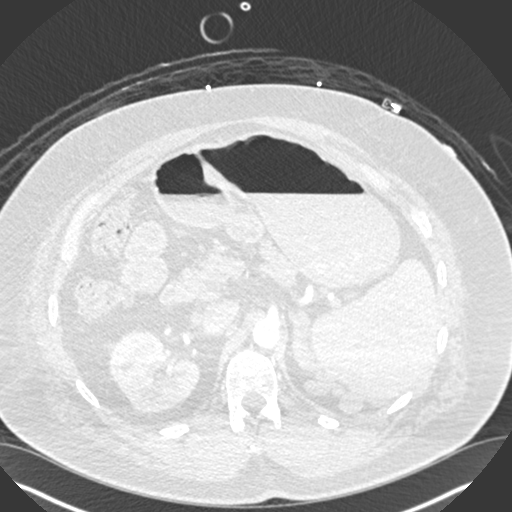
[im 39/312  mediastinal]
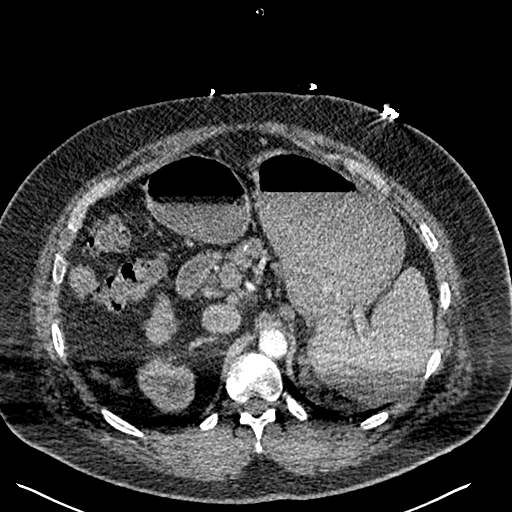
[im 59/312  lung]
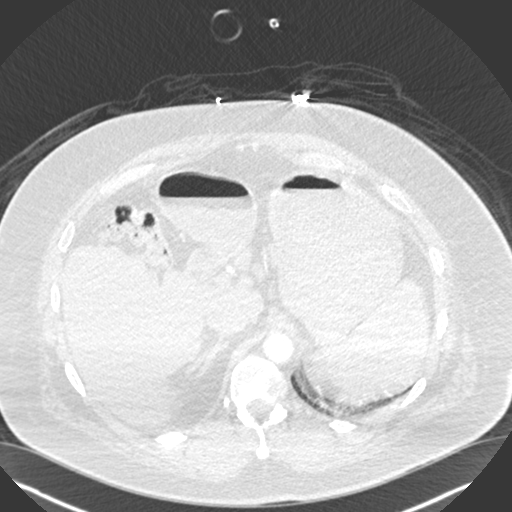
[im 78/312  mediastinal]
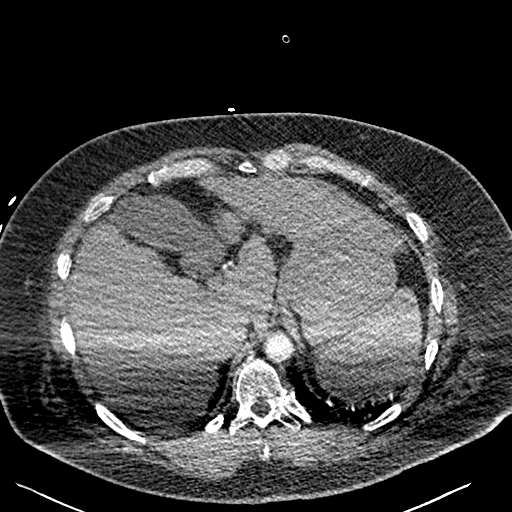
[im 98/312  lung]
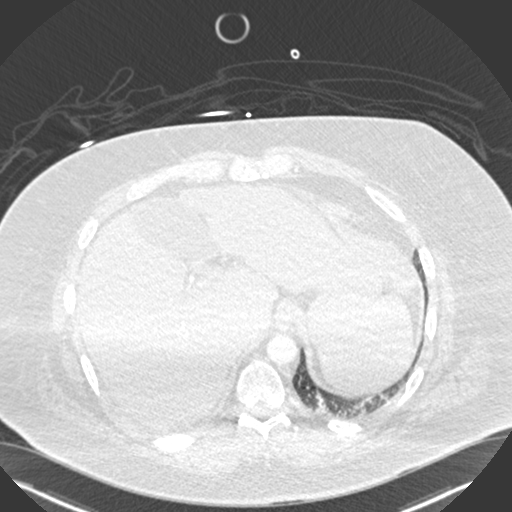
[im 117/312  mediastinal]
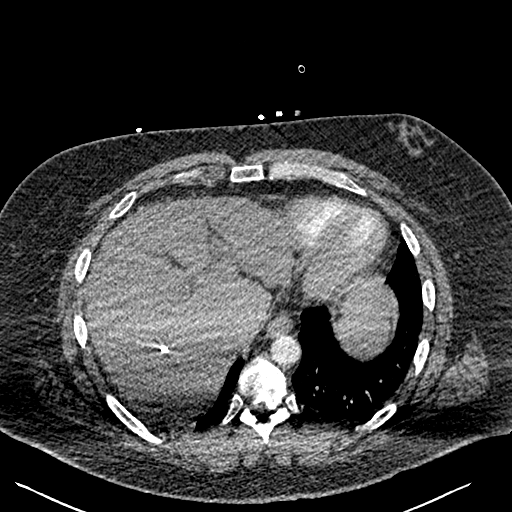
[im 137/312  lung]
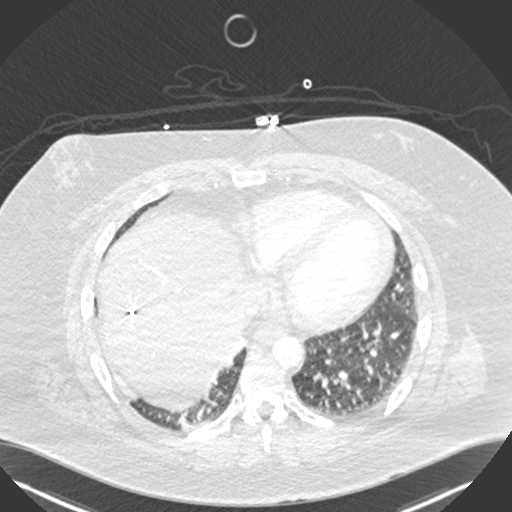
[im 156/312  mediastinal]
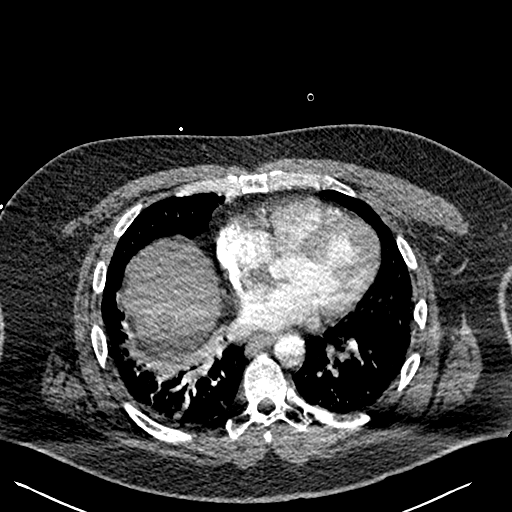
[im 175/312  lung]
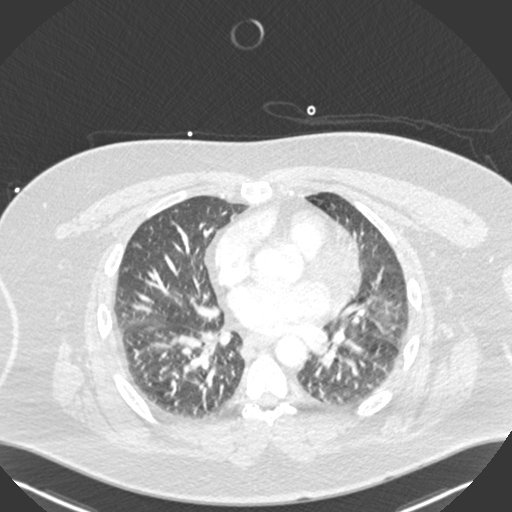
[im 195/312  mediastinal]
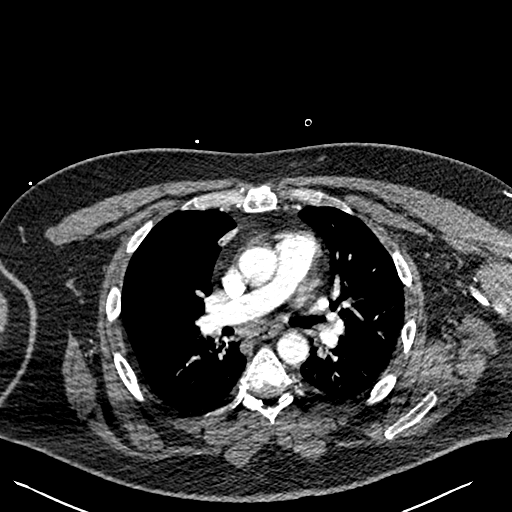
[im 214/312  lung]
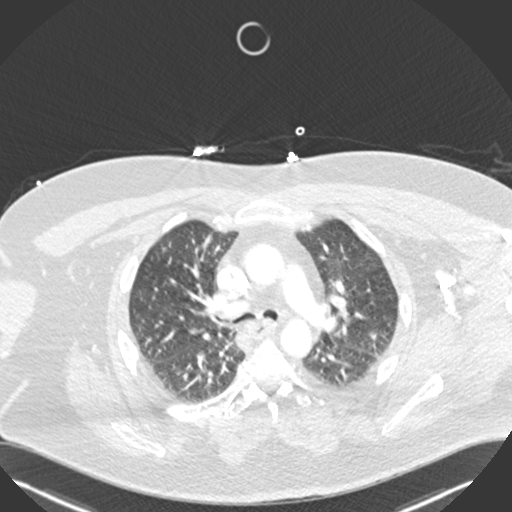
[im 234/312  mediastinal]
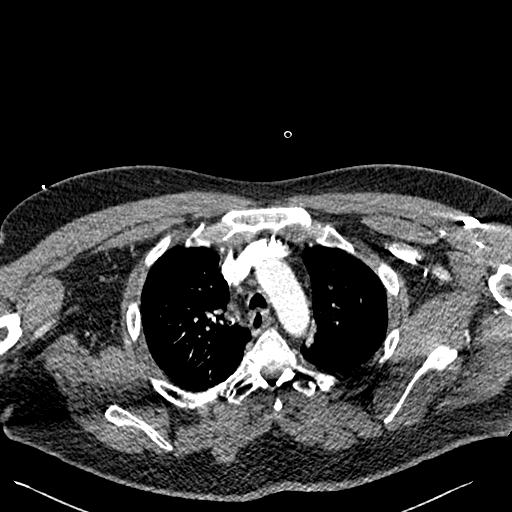
[im 253/312  lung]
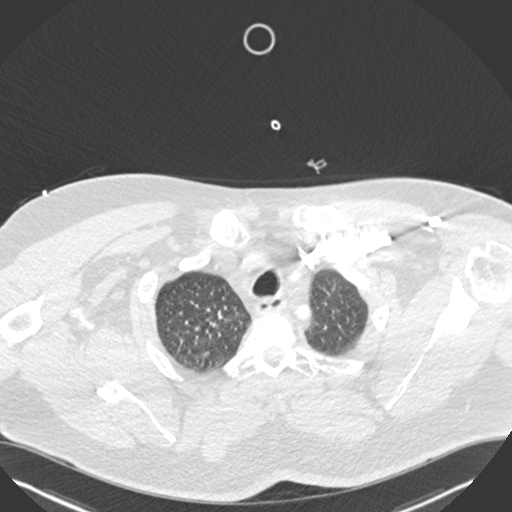
[im 273/312  mediastinal]
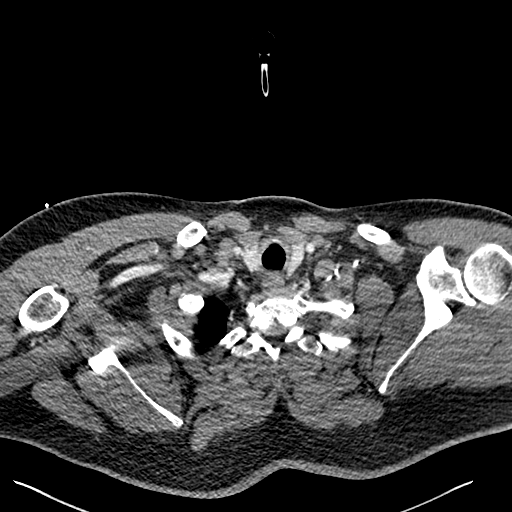
[im 292/312  lung]
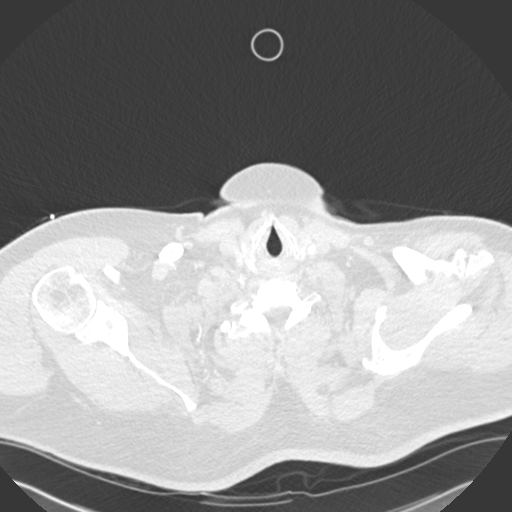

[Series 8: coronal mpr · coronal · 0.68mm/px · 1 of 145 slices shown]
[im 73/145  mediastinal]
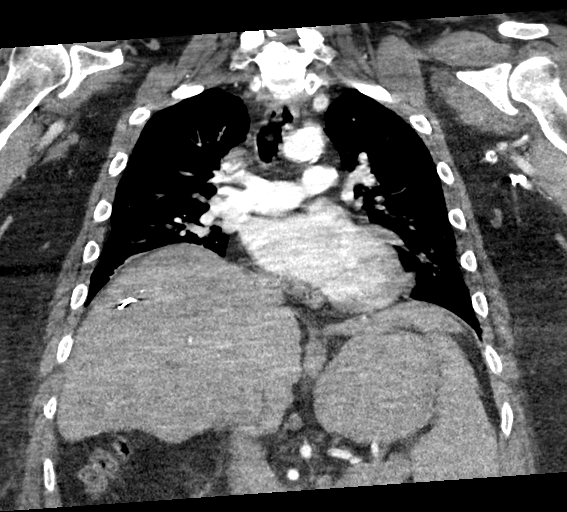

[16 of 36 positions shown; findings below may reference images not displayed]

RADIATION DOSE REDUCTION: This exam was performed according to the
departmental dose-optimization program which includes automated
exposure control, adjustment of the mA and/or kV according to
patient size and/or use of iterative reconstruction technique.

CONTRAST:  100mL OMNIPAQUE IOHEXOL 350 MG/ML SOLN
FINDINGS: CTA CHEST FINDINGS

Cardiovascular: Thoracic aorta shows atherosclerotic calcifications
without aneurysmal dilatation or dissection. No cardiac enlargement
is noted. No pericardial effusion is seen. No coronary
calcifications are noted. The pulmonary artery shows a normal
branching pattern without definitive filling defect to suggest
pulmonary embolism.

Mediastinum/Nodes: Thoracic inlet is within normal limits. No
sizable hilar or mediastinal adenopathy is noted. The esophagus is
within normal limits.

Lungs/Pleura: Bibasilar atelectatic changes are noted slightly worse
on the right than the left. No sizable effusion is seen.

Musculoskeletal: Mild degenerative change of the thoracic spine is
noted. No rib abnormality is noted.

Review of the MIP images confirms the above findings.

CT ABDOMEN and PELVIS FINDINGS

Hepatobiliary: Liver demonstrates linear metallic densities
throughout the right lobe consistent with prior fiducial marking. A
vague area of decreased attenuation is noted in the dome of the
liver just beneath the diaphragm but incompletely evaluated on this
exam. Cirrhotic changes of liver are noted. The gallbladder is
within normal limits.

Pancreas: Unremarkable. No pancreatic ductal dilatation or
surrounding inflammatory changes.

Spleen: Spleen is prominent. Multiple splenic varices are identified
increased in size when compared with the prior exam from [id number].
Spontaneous splenorenal shunt on the left is noted. No significant
esophageal or gastric varices are seen.

Adrenals/Urinary Tract: Adrenal glands are unremarkable. Kidneys
demonstrate a normal enhancement pattern. No renal calculi or
obstructive changes are noted. Normal excretion is noted on delayed
images. The bladder is well distended.

Stomach/Bowel: Mild diverticular change of the colon is noted
without evidence of diverticulitis. No obstructive or inflammatory
changes are seen. The appendix is within normal limits. No small
bowel abnormality is noted. Stomach is within normal limits.

Vascular/Lymphatic: Aortic atherosclerosis. Periportal and
gastrohepatic lymph nodes are noted. The largest of these is seen on
image number 29 of series 3 measuring up to 13 mm in short axis. No
other significant lymphadenopathy is noted.

Reproductive: Prostate is unremarkable.

Other: No abdominal wall hernia or abnormality. No abdominopelvic
ascites.

Musculoskeletal: No acute or significant osseous findings.

Review of the MIP images confirms the above findings.
IMPRESSION: CTA of the chest: No evidence of pulmonary emboli.

Mild bibasilar atelectasis right greater than left.

CT of the abdomen and pelvis: Changes of prior fiducial markers
within the liver. A vague area of decreased attenuation is noted
near the dome of the liver which may be related to the prior
ablation treatment. This is incompletely evaluated on this exam.

Diverticulosis without diverticulitis.

Changes consistent with portal hypertension with multiple splenic
varices and spontaneous left splenorenal shunt.

Stable appearing periportal and gastrohepatic lymph nodes when
compared with report from an MRI of 07/23/2021
